# Patient Record
Sex: Female | Born: 1965 | Race: White | Hispanic: Yes | Marital: Married | Smoking: Never smoker
Health system: Southern US, Community
[De-identification: ages and names within clinical notes are randomized; demographics above are authoritative.]

## PROBLEM LIST (undated history)

## (undated) DIAGNOSIS — E669 Obesity, unspecified: Secondary | ICD-10-CM

## (undated) DIAGNOSIS — N3946 Mixed incontinence: Secondary | ICD-10-CM

## (undated) DIAGNOSIS — I10 Essential (primary) hypertension: Secondary | ICD-10-CM

## (undated) DIAGNOSIS — E7800 Pure hypercholesterolemia, unspecified: Secondary | ICD-10-CM

## (undated) HISTORY — DX: Pure hypercholesterolemia, unspecified: E78.00

## (undated) HISTORY — PX: BREAST LUMPECTOMY: SHX2

## (undated) HISTORY — PX: BREAST EXCISIONAL BIOPSY: SUR124

## (undated) HISTORY — DX: Mixed incontinence: N39.46

## (undated) HISTORY — DX: Obesity, unspecified: E66.9

---

## 1998-06-19 HISTORY — PX: TUBAL LIGATION: SHX77

## 2005-07-10 ENCOUNTER — Ambulatory Visit: Payer: Self-pay | Admitting: Family Medicine

## 2005-07-11 ENCOUNTER — Ambulatory Visit: Payer: Self-pay | Admitting: *Deleted

## 2005-08-21 ENCOUNTER — Ambulatory Visit: Payer: Self-pay | Admitting: Family Medicine

## 2005-08-24 ENCOUNTER — Ambulatory Visit (HOSPITAL_COMMUNITY): Admission: RE | Admit: 2005-08-24 | Discharge: 2005-08-24 | Payer: Self-pay | Admitting: Internal Medicine

## 2005-09-04 ENCOUNTER — Ambulatory Visit: Payer: Self-pay | Admitting: Family Medicine

## 2005-09-14 ENCOUNTER — Ambulatory Visit: Payer: Self-pay | Admitting: Family Medicine

## 2006-05-04 ENCOUNTER — Ambulatory Visit: Payer: Self-pay | Admitting: Family Medicine

## 2006-05-30 ENCOUNTER — Ambulatory Visit: Payer: Self-pay | Admitting: Family Medicine

## 2006-07-11 ENCOUNTER — Ambulatory Visit: Payer: Self-pay | Admitting: Family Medicine

## 2006-10-03 ENCOUNTER — Ambulatory Visit: Payer: Self-pay | Admitting: Family Medicine

## 2006-12-24 ENCOUNTER — Ambulatory Visit: Payer: Self-pay | Admitting: Internal Medicine

## 2007-01-23 ENCOUNTER — Ambulatory Visit: Payer: Self-pay | Admitting: Internal Medicine

## 2007-01-25 ENCOUNTER — Ambulatory Visit (HOSPITAL_COMMUNITY): Admission: RE | Admit: 2007-01-25 | Discharge: 2007-01-25 | Payer: Self-pay | Admitting: Family Medicine

## 2007-01-29 ENCOUNTER — Ambulatory Visit: Payer: Self-pay | Admitting: Internal Medicine

## 2007-03-06 ENCOUNTER — Encounter (INDEPENDENT_AMBULATORY_CARE_PROVIDER_SITE_OTHER): Payer: Self-pay | Admitting: *Deleted

## 2007-04-15 ENCOUNTER — Ambulatory Visit: Payer: Self-pay | Admitting: Internal Medicine

## 2007-06-21 ENCOUNTER — Ambulatory Visit: Payer: Self-pay | Admitting: Internal Medicine

## 2007-08-30 ENCOUNTER — Encounter (INDEPENDENT_AMBULATORY_CARE_PROVIDER_SITE_OTHER): Payer: Self-pay | Admitting: Family Medicine

## 2007-08-30 ENCOUNTER — Ambulatory Visit: Payer: Self-pay | Admitting: Internal Medicine

## 2007-08-30 LAB — CONVERTED CEMR LAB
ALT: 15 units/L (ref 0–35)
Basophils Absolute: 0 10*3/uL (ref 0.0–0.1)
Basophils Relative: 0 % (ref 0–1)
CO2: 24 meq/L (ref 19–32)
Chloride: 107 meq/L (ref 96–112)
Cholesterol: 149 mg/dL (ref 0–200)
Glucose, Bld: 100 mg/dL — ABNORMAL HIGH (ref 70–99)
HCT: 40.5 % (ref 36.0–46.0)
HDL: 52 mg/dL (ref >39)
LDL Cholesterol: 81 mg/dL (ref 0–99)
Lymphocytes Relative: 35 % (ref 12–46)
MCHC: 32.1 g/dL (ref 30.0–36.0)
Monocytes Absolute: 0.4 10*3/uL (ref 0.1–1.0)
Neutrophils Relative %: 56 % (ref 43–77)
Platelets: 182 10*3/uL (ref 150–400)
Potassium: 4.6 meq/L (ref 3.5–5.3)
RDW: 13.1 % (ref 11.5–15.5)
Sodium: 140 meq/L (ref 135–145)
TSH: 0.939 microintl units/mL (ref 0.350–5.50)
Total CHOL/HDL Ratio: 2.9
VLDL: 16 mg/dL (ref 0–40)

## 2007-10-07 ENCOUNTER — Encounter: Payer: Self-pay | Admitting: Family Medicine

## 2007-10-07 ENCOUNTER — Ambulatory Visit: Payer: Self-pay | Admitting: Internal Medicine

## 2007-10-07 LAB — CONVERTED CEMR LAB
Chlamydia, DNA Probe: NEGATIVE
GC Probe Amp, Genital: NEGATIVE

## 2007-12-19 ENCOUNTER — Ambulatory Visit: Payer: Self-pay | Admitting: Internal Medicine

## 2008-05-22 ENCOUNTER — Ambulatory Visit: Payer: Self-pay | Admitting: Internal Medicine

## 2008-05-22 ENCOUNTER — Encounter (INDEPENDENT_AMBULATORY_CARE_PROVIDER_SITE_OTHER): Payer: Self-pay | Admitting: Adult Health

## 2008-05-22 LAB — CONVERTED CEMR LAB
Alkaline Phosphatase: 83 units/L (ref 39–117)
BUN: 8 mg/dL (ref 6–23)
Basophils Relative: 0 % (ref 0–1)
Calcium: 9.2 mg/dL (ref 8.4–10.5)
Chloride: 105 meq/L (ref 96–112)
Cholesterol: 187 mg/dL (ref 0–200)
Eosinophils Absolute: 0 10*3/uL (ref 0.0–0.7)
Eosinophils Relative: 0 % (ref 0–5)
Glucose, Bld: 88 mg/dL (ref 70–99)
HDL: 50 mg/dL (ref >39)
LDL Cholesterol: 118 mg/dL — ABNORMAL HIGH (ref 0–99)
Monocytes Absolute: 0.3 10*3/uL (ref 0.1–1.0)
Neutrophils Relative %: 52 % (ref 43–77)
Platelets: 178 10*3/uL (ref 150–400)
Potassium: 4.1 meq/L (ref 3.5–5.3)
RBC: 4.54 M/uL (ref 3.87–5.11)
RDW: 13 % (ref 11.5–15.5)
TSH: 1.648 microintl units/mL (ref 0.350–4.50)
Total CHOL/HDL Ratio: 3.7
Triglycerides: 95 mg/dL (ref <150)
WBC: 5.2 10*3/uL (ref 4.0–10.5)

## 2008-05-27 ENCOUNTER — Ambulatory Visit (HOSPITAL_COMMUNITY): Admission: RE | Admit: 2008-05-27 | Discharge: 2008-05-27 | Payer: Self-pay | Admitting: Internal Medicine

## 2009-02-10 ENCOUNTER — Ambulatory Visit: Payer: Self-pay | Admitting: Internal Medicine

## 2009-02-10 ENCOUNTER — Encounter (INDEPENDENT_AMBULATORY_CARE_PROVIDER_SITE_OTHER): Payer: Self-pay | Admitting: Adult Health

## 2009-02-10 LAB — CONVERTED CEMR LAB
Basophils Relative: 0 % (ref 0–1)
Eosinophils Relative: 1 % (ref 0–5)
Helicobacter Pylori Antibody-IgG: 4.5 — ABNORMAL HIGH
Lymphs Abs: 2.1 10*3/uL (ref 0.7–4.0)
MCHC: 33.3 g/dL (ref 30.0–36.0)
MCV: 88 fL (ref 78.0–100.0)
Monocytes Relative: 7 % (ref 3–12)
Neutro Abs: 3.2 10*3/uL (ref 1.7–7.7)
Platelets: 168 10*3/uL (ref 150–400)
RBC: 4.65 M/uL (ref 3.87–5.11)
WBC: 5.8 10*3/uL (ref 4.0–10.5)

## 2009-02-24 ENCOUNTER — Ambulatory Visit (HOSPITAL_COMMUNITY): Admission: RE | Admit: 2009-02-24 | Discharge: 2009-02-24 | Payer: Self-pay | Admitting: Internal Medicine

## 2009-03-23 ENCOUNTER — Encounter (INDEPENDENT_AMBULATORY_CARE_PROVIDER_SITE_OTHER): Payer: Self-pay | Admitting: Adult Health

## 2009-03-23 ENCOUNTER — Ambulatory Visit: Payer: Self-pay | Admitting: Internal Medicine

## 2009-03-23 LAB — CONVERTED CEMR LAB: GC Probe Amp, Genital: NEGATIVE

## 2009-08-18 ENCOUNTER — Ambulatory Visit: Payer: Self-pay | Admitting: Internal Medicine

## 2009-09-10 ENCOUNTER — Ambulatory Visit: Payer: Self-pay | Admitting: Internal Medicine

## 2010-06-19 HISTORY — PX: EYE SURGERY: SHX253

## 2010-07-08 ENCOUNTER — Encounter (INDEPENDENT_AMBULATORY_CARE_PROVIDER_SITE_OTHER): Payer: Self-pay | Admitting: *Deleted

## 2010-07-08 ENCOUNTER — Other Ambulatory Visit: Payer: Self-pay | Admitting: Family Medicine

## 2010-07-08 LAB — CONVERTED CEMR LAB
ALT: 20 units/L (ref 0–35)
Albumin: 4.5 g/dL (ref 3.5–5.2)
Alkaline Phosphatase: 109 units/L (ref 39–117)
BUN: 10 mg/dL (ref 6–23)
Chlamydia, DNA Probe: NEGATIVE
Chloride: 104 meq/L (ref 96–112)
GC Probe Amp, Genital: NEGATIVE
HDL: 43 mg/dL (ref >39)
MCV: 91 fL (ref 78.0–100.0)
Platelets: 196 10*3/uL (ref 150–400)
RBC: 4.69 M/uL (ref 3.87–5.11)
TSH: 1.1 microintl units/mL (ref 0.350–4.500)
Total CHOL/HDL Ratio: 4.1
Total Protein: 7.5 g/dL (ref 6.0–8.3)
Triglycerides: 105 mg/dL (ref <150)
VLDL: 21 mg/dL (ref 0–40)
WBC: 5.6 10*3/uL (ref 4.0–10.5)

## 2011-06-20 DIAGNOSIS — E7800 Pure hypercholesterolemia, unspecified: Secondary | ICD-10-CM

## 2011-06-20 HISTORY — DX: Pure hypercholesterolemia, unspecified: E78.00

## 2014-02-16 ENCOUNTER — Ambulatory Visit: Payer: Self-pay | Attending: Internal Medicine

## 2014-04-09 ENCOUNTER — Other Ambulatory Visit: Payer: Self-pay | Admitting: Family Medicine

## 2014-04-09 ENCOUNTER — Encounter: Payer: Self-pay | Admitting: Family Medicine

## 2014-04-09 ENCOUNTER — Ambulatory Visit: Payer: Self-pay | Attending: Family Medicine | Admitting: Family Medicine

## 2014-04-09 VITALS — BP 143/88 | HR 77 | Temp 98.5°F | Resp 18 | Ht 59.61 in | Wt 168.0 lb

## 2014-04-09 DIAGNOSIS — IMO0001 Reserved for inherently not codable concepts without codable children: Secondary | ICD-10-CM

## 2014-04-09 DIAGNOSIS — R319 Hematuria, unspecified: Secondary | ICD-10-CM

## 2014-04-09 DIAGNOSIS — Z23 Encounter for immunization: Secondary | ICD-10-CM

## 2014-04-09 DIAGNOSIS — R030 Elevated blood-pressure reading, without diagnosis of hypertension: Secondary | ICD-10-CM | POA: Insufficient documentation

## 2014-04-09 DIAGNOSIS — J309 Allergic rhinitis, unspecified: Secondary | ICD-10-CM

## 2014-04-09 DIAGNOSIS — M545 Low back pain, unspecified: Secondary | ICD-10-CM

## 2014-04-09 NOTE — Progress Notes (Signed)
   Subjective:    Patient ID: Sheri Mann, female    DOB: 1966/02/27, 48 y.o.   MRN: [id number] CC: establish care. Congestion/hoarness x 2 months, pain on L lower side,   HPI 48 yo Hispanic female presents to establish care and discuss the following: History obtained via in house interpreter   1. Congestion: x 2 months. Associated with hoarseness. Could not speak for 15 days. No fever, no sick contacts. Symptoms improving. She is taking alka seltzer, lemon, vicks.   2. L side pain: more than a year ago. Comes and goes. Also left sided. Numbness radiates down L thigh. Also with L low back pain. No dysuria. No gross hematuria.   2. Elevated BP: first informed of high blood pressure 2 years ago. Admits to HA and decreased vision. Denies CP and SOB.    Soc hx: chronic non smoker  Review of Systems As per HPI  GAD-7: score of 9. 0-3,5,7. 2-1,4,6. 3-2.     Objective:   Physical Exam BP 143/88  Pulse 77  Temp(Src) 98.5 F (36.9 C) (Oral)  Resp 18  Ht 4' 11.61" (1.514 m)  Wt 168 lb (76.204 kg)  BMI 33.24 kg/m2  SpO2 100%  LMP 02/10/2014 General appearance: alert, cooperative and no distress Head: Normocephalic, without obvious abnormality, atraumatic Eyes: conjunctivae/corneas clear. PERRL, EOM's intact.  Ears: normal TM's and external ear canals both ears Nose: no discharge, turbinates pink, swollen Throat: lips, mucosa, and tongue normal; teeth and gums normal Neck: no adenopathy and thyroid not enlarged, symmetric, no tenderness/mass/nodules Abdomen: soft, non-tender; bowel sounds normal; no masses,  no organomegaly Back Exam: Back: Normal Curvature, no deformities or CVA tenderness  Paraspinal Tenderness: L lumbar   LE Strength 5/5  LE Sensation: in tact  LE Reflexes 2+ and symmetric  Straight leg raise: negative  FABER: negative      Assessment & Plan:

## 2014-04-09 NOTE — Progress Notes (Signed)
Establish Care Complaining of congestion, no voice at night x 2 month

## 2014-04-10 DIAGNOSIS — R030 Elevated blood-pressure reading, without diagnosis of hypertension: Secondary | ICD-10-CM

## 2014-04-10 DIAGNOSIS — M545 Low back pain, unspecified: Secondary | ICD-10-CM | POA: Insufficient documentation

## 2014-04-10 DIAGNOSIS — R319 Hematuria, unspecified: Secondary | ICD-10-CM | POA: Insufficient documentation

## 2014-04-10 DIAGNOSIS — IMO0001 Reserved for inherently not codable concepts without codable children: Secondary | ICD-10-CM | POA: Insufficient documentation

## 2014-04-10 DIAGNOSIS — J309 Allergic rhinitis, unspecified: Secondary | ICD-10-CM | POA: Insufficient documentation

## 2014-04-10 DIAGNOSIS — M79605 Pain in left leg: Secondary | ICD-10-CM

## 2014-04-10 LAB — COMPREHENSIVE METABOLIC PANEL
ALBUMIN: 4.2 g/dL (ref 3.5–5.2)
ALT: 18 U/L (ref 0–35)
AST: 19 U/L (ref 0–37)
Alkaline Phosphatase: 126 U/L — ABNORMAL HIGH (ref 39–117)
BUN: 14 mg/dL (ref 6–23)
CALCIUM: 9.9 mg/dL (ref 8.4–10.5)
CO2: 28 meq/L (ref 19–32)
Chloride: 105 mEq/L (ref 96–112)
Creat: 0.82 mg/dL (ref 0.50–1.10)
GLUCOSE: 100 mg/dL — AB (ref 70–99)
POTASSIUM: 3.8 meq/L (ref 3.5–5.3)
Sodium: 139 mEq/L (ref 135–145)
Total Bilirubin: 0.4 mg/dL (ref 0.2–1.2)
Total Protein: 7.2 g/dL (ref 6.0–8.3)

## 2014-04-10 LAB — LIPID PANEL
CHOLESTEROL: 199 mg/dL (ref 0–200)
HDL: 51 mg/dL (ref >39)
LDL CALC: 120 mg/dL — AB (ref 0–99)
TRIGLYCERIDES: 141 mg/dL (ref <150)
Total CHOL/HDL Ratio: 3.9 Ratio
VLDL: 28 mg/dL (ref 0–40)

## 2014-04-10 LAB — CBC
HCT: 38.8 % (ref 36.0–46.0)
Hemoglobin: 12.8 g/dL (ref 12.0–15.0)
MCH: 28.3 pg (ref 26.0–34.0)
MCHC: 33 g/dL (ref 30.0–36.0)
MCV: 85.8 fL (ref 78.0–100.0)
PLATELETS: 188 10*3/uL (ref 150–400)
RBC: 4.52 MIL/uL (ref 3.87–5.11)
RDW: 13.8 % (ref 11.5–15.5)
WBC: 5.5 10*3/uL (ref 4.0–10.5)

## 2014-04-10 LAB — POCT URINALYSIS DIPSTICK
BILIRUBIN UA: NEGATIVE
GLUCOSE UA: NEGATIVE
Leukocytes, UA: NEGATIVE
NITRITE UA: NEGATIVE
PH UA: 5
Protein, UA: NEGATIVE
SPEC GRAV UA: 1.02
UROBILINOGEN UA: 0.2

## 2014-04-10 MED ORDER — TRIAMCINOLONE ACETONIDE 55 MCG/ACT NA AERO: 2.0000 | INHALATION_SPRAY | Freq: Every day | NASAL | Status: DC

## 2014-04-10 MED ORDER — LORATADINE 10 MG PO TABS: 10.0000 mg | ORAL_TABLET | Freq: Every day | ORAL | Status: DC

## 2014-04-10 NOTE — Patient Instructions (Signed)
Thank you for coming in today. It was a pleasure meeting you. I look forward to being your primary doctor.  Dr. Armen PickupFunches  Ejercicios para la espalda (Back Exercises) Estos ejercicios ayudan a tratar y a prevenir lesiones en la espalda. El objetivo es aumentar la fuerza en los msculos del vientre (abdomen) y de la espalda. Estos ejercicios tambin lo ayudarn a mejorar la flexibilidad. Comience a realizar estos ejercicios cuando el mdico se lo indique. CUIDADOS EN EL HOGAR Los ejercicios para la espalda incluyen: Inclinacin de la pelvis.  Recustese sobre la espalda con las rodillas flexionadas. Incline la pelvis hasta que la parte inferior de la espalda se apoye en el piso. Mantenga esta posicin durante 5 a 10 segundos. Repita este ejercicio 5 a 10 veces. Rodilla al pecho.  Empuje con la rodilla contra el pecho y Singacmantenga esta posicin durante 20 a 30 segundos. Repita con la otra pierna. Esto puede realizarlo con la otra pierna extendida o flexionada, del modo en que se sienta ms cmodo. Luego presione ambas rodillas contra el pecho. Abdominales.  Doble las rodillas a 90 grados. Comience doblando la pelvis y haga un abdominal parcial y en forma lenta. Slo eleve la parte superior a 30  45 grados del suelo. Emplee al BJ's Wholesalemenos entre 2 y 3 segundos para cada abdominal. No haga los abdominales con las rodillas extendidas. Si hacer abdominales parciales le resulta difcil, simplemente haga el ejercicio pero slo endureciendo los msculos del vientre(abdomen) y manteniendo segn la indicacin. Elevar la cadera.  Recustese sobre la espalda con las rodillas flexionadas a 90 grados. Presione con los pies y los hombros a medida que eleva las caderas a 5 cm del suelo. Mantenga durante 10 segundos y repita 5 a 10 veces. Arquear la espalda.  Acustese Eli Lilly and Companysobre el estmago. Levntese apoyando los codos doblados. Presione lentamente con las manos, formando un arco con la zona inferior de la espalda. Repita  entre 3 y 5 veces. Elevar los hombros.  Acustese boca abajo con los brazos a los lados del cuerpo. Presione las caderas y Dance movement psychotherapistel torso contra el suelo mientras eleva lentamente la cabeza y los hombros del suelo. No exagere al ARAMARK Corporationhacer los ejercicios. Tenga cuidado al principio. Los ejercicios pueden causar algunas molestias leves en la espalda. Si el dolor dura ms de 15 minutos, detenga los ejercicios hasta que consulte al mdico. Los problemas en la espalda mejoran de Airmontmanera lenta con esta terapia.  Document Released: 09/20/2010 Document Revised: 08/28/2011 [facility] Patient Information 2015 [place]ExitCare, [place]LLC. This information is not intended to replace advice given to you by your health care provider. Make sure you discuss any questions you have with your health care provider.

## 2014-04-10 NOTE — Assessment & Plan Note (Signed)
A: congestion x 2 months consistent with allergic rhinitis P: claritin 10 daily  nasacort

## 2014-04-10 NOTE — Assessment & Plan Note (Signed)
A: history of elevated BP and BP elevated today P: F/u in 2 weeks for repeat BP check CBC CMP Lipids

## 2014-04-10 NOTE — Addendum Note (Signed)
Addended by: Dyann KiefGIRALDEZ, Rylynne Schicker M on: 04/10/2014 12:08 PM   Modules accepted: Orders

## 2014-04-10 NOTE — Assessment & Plan Note (Signed)
A: moderate hematuria on UA P:  Repeat UA with micro and culture at f/u

## 2014-04-10 NOTE — Assessment & Plan Note (Signed)
A: left low back pain w/o sciatica P: Exercise

## 2014-04-14 ENCOUNTER — Telehealth: Payer: Self-pay | Admitting: *Deleted

## 2014-04-14 NOTE — Telephone Encounter (Signed)
Pt aware of lab results, advised to maintain a diet low in carbohydrates and fat

## 2014-04-14 NOTE — Telephone Encounter (Signed)
Message copied by Dyann KiefGIRALDEZ, Sherrine Salberg M on Tue Apr 14, 2014  4:29 PM ------      Message from: Dessa PhiFUNCHES, JOSALYN      Created: Mon Apr 13, 2014  3:40 PM       Normal CBC      CMP normal       Slightly elevated LDL (cholesterol), no need to medication therapy.       Recommend exercise and low carb/low fat diet ------

## 2014-04-28 ENCOUNTER — Telehealth: Payer: Self-pay | Admitting: Family Medicine

## 2014-04-28 NOTE — Telephone Encounter (Signed)
Pt. Called stating that she has been having a lot of pain on her left side going down to her left leg. Pt. States that her leg feels numb. Please f/u with pt. With an interpreter.

## 2014-05-01 NOTE — Telephone Encounter (Signed)
Pt stated has pain on legs for a couple of month. Has appointment with PCP on Tuesday. Advised if any worsen go to Urgent care

## 2014-05-01 NOTE — Telephone Encounter (Signed)
Pt. Called stating that she has been having a lot of pain on her left side going down to her left leg. Pt. States that her leg feels numb. Please f/u with pt. With an interpreter. °

## 2014-05-05 ENCOUNTER — Encounter: Payer: Self-pay | Admitting: Family Medicine

## 2014-05-05 ENCOUNTER — Ambulatory Visit: Payer: Self-pay | Attending: Family Medicine | Admitting: Family Medicine

## 2014-05-05 VITALS — BP 131/90 | HR 73 | Temp 98.3°F | Resp 16 | Ht 59.0 in | Wt 171.0 lb

## 2014-05-05 DIAGNOSIS — M545 Low back pain, unspecified: Secondary | ICD-10-CM

## 2014-05-05 DIAGNOSIS — Z299 Encounter for prophylactic measures, unspecified: Secondary | ICD-10-CM

## 2014-05-05 DIAGNOSIS — Z418 Encounter for other procedures for purposes other than remedying health state: Secondary | ICD-10-CM

## 2014-05-05 DIAGNOSIS — M5442 Lumbago with sciatica, left side: Secondary | ICD-10-CM | POA: Insufficient documentation

## 2014-05-05 DIAGNOSIS — Z23 Encounter for immunization: Secondary | ICD-10-CM

## 2014-05-05 MED ORDER — CYCLOBENZAPRINE HCL 10 MG PO TABS: 10.0000 mg | ORAL_TABLET | Freq: Three times a day (TID) | ORAL | Status: DC | PRN

## 2014-05-05 MED ORDER — NAPROXEN 500 MG PO TABS: 500.0000 mg | ORAL_TABLET | Freq: Two times a day (BID) | ORAL | Status: DC

## 2014-05-05 MED ORDER — GABAPENTIN 300 MG PO CAPS: 300.0000 mg | ORAL_CAPSULE | Freq: Every day | ORAL | Status: DC

## 2014-05-05 NOTE — Progress Notes (Signed)
Complaining of LT hip pain going down to lt leg Stated pain was so strong was hard to walk

## 2014-05-05 NOTE — Assessment & Plan Note (Signed)
A: low back pain with intermittent sciatica P: Lumbar x-ray Gabapentin at night Naproxen, flexeril  F/u in 6 weeks

## 2014-05-05 NOTE — Progress Notes (Signed)
   Subjective:    Patient ID: Sheri Mann, female    DOB: 1965-08-10, 48 y.o.   MRN: [id number] CC: L low back pain HPI [age] yo hispanic female, history via interpreter  1. L sided back pain: comes and goes. Bad now. Radiates down L leg. No falls. No leg weakness. No fecal or urinary incontinence. No groin numbness. Patient does not work. No treatments at home. Has tried home low back exercises which exacerbate pain at time.   Soc hx: non smoker  Review of Systems As per HPI     Objective:   Physical Exam BP 131/90 mmHg  Pulse 73  Temp(Src) 98.3 F (36.8 C) (Oral)  Resp 16  Ht 4\' 11"  (1.499 m)  Wt 171 lb (77.565 kg)  BMI 34.52 kg/m2  SpO2 100%  LMP 02/10/2014 General appearance: alert, cooperative and no distress  Back Exam: Back: Normal Curvature, no deformities or CVA tenderness  Paraspinal Tenderness: negative   LE Strength 5/5  LE Sensation: in tact  LE Reflexes 2+ and symmetric  Straight leg raise: positive on L side         Assessment & Plan:

## 2014-05-05 NOTE — Patient Instructions (Addendum)
Ms. Penne LashGracia,  Thank you for coming today.  F/u in 8 weeks  Dr. Armen PickupFunches

## 2014-05-06 ENCOUNTER — Ambulatory Visit (HOSPITAL_COMMUNITY)
Admission: RE | Admit: 2014-05-06 | Discharge: 2014-05-06 | Disposition: A | Discharge status: Home / Self Care | Payer: Self-pay | Source: Ambulatory Visit | Attending: Family Medicine | Admitting: Family Medicine

## 2014-05-06 DIAGNOSIS — M545 Low back pain, unspecified: Secondary | ICD-10-CM

## 2014-05-06 DIAGNOSIS — M79605 Pain in left leg: Secondary | ICD-10-CM | POA: Insufficient documentation

## 2014-05-07 ENCOUNTER — Telehealth: Payer: Self-pay | Admitting: *Deleted

## 2014-05-07 NOTE — Telephone Encounter (Signed)
Pt aware of lab results, advised to continue with current plan

## 2014-05-07 NOTE — Telephone Encounter (Signed)
-----   Message from Lora PaulaJosalyn C Funches, MD sent at 05/07/2014 11:52 AM EST ----- Normal low back x-ray Continue current plan

## 2014-05-19 ENCOUNTER — Ambulatory Visit: Payer: Self-pay | Attending: Family Medicine

## 2014-08-20 ENCOUNTER — Encounter: Payer: Self-pay | Admitting: Family Medicine

## 2014-08-20 ENCOUNTER — Ambulatory Visit: Payer: Self-pay | Attending: Family Medicine | Admitting: Family Medicine

## 2014-08-20 VITALS — BP 126/82 | HR 76 | Temp 98.2°F | Resp 16 | Ht 59.0 in | Wt 174.0 lb

## 2014-08-20 DIAGNOSIS — M545 Low back pain, unspecified: Secondary | ICD-10-CM

## 2014-08-20 DIAGNOSIS — M25562 Pain in left knee: Secondary | ICD-10-CM | POA: Insufficient documentation

## 2014-08-20 DIAGNOSIS — J309 Allergic rhinitis, unspecified: Secondary | ICD-10-CM

## 2014-08-20 DIAGNOSIS — R0781 Pleurodynia: Secondary | ICD-10-CM

## 2014-08-20 MED ORDER — LORATADINE 10 MG PO TABS: 10.0000 mg | ORAL_TABLET | Freq: Every day | ORAL | Status: DC

## 2014-08-20 MED ORDER — TRIAMCINOLONE ACETONIDE 55 MCG/ACT NA AERO: 2.0000 | INHALATION_SPRAY | Freq: Every day | NASAL | Status: DC

## 2014-08-20 MED ORDER — NAPROXEN 500 MG PO TABS: 500.0000 mg | ORAL_TABLET | Freq: Two times a day (BID) | ORAL | Status: DC

## 2014-08-20 MED ORDER — METHYLPREDNISOLONE ACETATE 40 MG/ML IJ SUSP
40.0000 mg | Freq: Once | INTRAMUSCULAR | Status: AC
  Administered 2014-08-20: 40 mg via INTRA_ARTICULAR

## 2014-08-20 NOTE — Progress Notes (Signed)
   Subjective:    Patient ID: Sheri Mann, female    DOB: Mar 24, 1966, 49 y.o.   MRN: [id number] CC: L knee pain  X 1 month, URI symptoms x 2 weeks  HPI 49 yo M:  1. L knee pain: x one month. Medial knee pain with swelling. No erythema. No injury. Pain is worse when walking up stairs. Has similar pain and swelling in R knee many years ago.   2. Congestion: in nose with SOB x 2 weeks. Has hx of allergic rhinitis. No taking nasacort or claritin. No known sick contacts. Fever 1 week ago. Has back in back when she takes a deep breath x 2 weeks. No coughing, dizziness, lightheadedness or chest pain.   Soc hx: non smoker  Review of Systems As per HPI     Objective:   Physical Exam BP 126/82 mmHg  Pulse 76  Temp(Src) 98.2 F (36.8 C)  Resp 16  Ht 4\' 11"  (1.499 m)  Wt 174 lb (78.926 kg)  BMI 35.13 kg/m2  SpO2 99% General appearance: alert, cooperative and no distress Head: Normocephalic, without obvious abnormality, atraumatic Eyes: conjunctivae/corneas clear. PERRL, EOM's intact. Fundi benign. Ears: normal TM's and external ear canals both ears Nose: no discharge, turbinates pink, swollen Throat: lips, mucosa, and tongue normal; teeth and gums normal Neck: no adenopathy, supple, symmetrical, trachea midline and thyroid not enlarged, symmetric, no tenderness/mass/nodules Lungs: clear to auscultation bilaterally Heart: regular rate and rhythm, S1, S2 normal, no murmur, click, rub or gallop  Back: no rash, non tender  Extremities: L knee with mild effusion, no erythema, full ROM, TTP medial knee joint   After obtaining informed consent and cleaning the skin using iodine and alcohol a  steroid injection was performed at L knee using 1% plain Lidocaine and 40 mg of Depo Medrol. This was well tolerated      Assessment & Plan:

## 2014-08-20 NOTE — Patient Instructions (Signed)
Mrs. Sheri Mann,  Thank you for coming in today.  1. L knee pain with swelling: Osteoarthritis suspected Possible soft tissue injury Plan: Steroid injection today Knee x-ray Naproxen as needed for pain Referral to sports medicine for knee ultrasound   2. Nasal congestion due to allergic rhinitis: Restart nasacort and claritin   3. Back pain deep breathing: Possible from recent viral infection Ruling out blood clot with D-dimer  F/u in 6 weeks   Dr. Armen PickupFunches

## 2014-08-20 NOTE — Progress Notes (Signed)
Patient here due to her left knee pain on ambulating for 1 month. Patient states knee was swollen in the past but not now. Patient did have episode of feeling really really hot and not sure what it is from. Patient also c/o of nose congestion and difficulty breathing times 2 weeks. Patient has used Tylenol with little relief.

## 2014-08-21 ENCOUNTER — Telehealth: Payer: Self-pay | Admitting: Emergency Medicine

## 2014-08-21 ENCOUNTER — Ambulatory Visit: Payer: Self-pay | Attending: Family Medicine

## 2014-08-21 ENCOUNTER — Other Ambulatory Visit: Payer: Self-pay | Admitting: Emergency Medicine

## 2014-08-21 ENCOUNTER — Telehealth: Payer: Self-pay | Admitting: Family Medicine

## 2014-08-21 DIAGNOSIS — Z139 Encounter for screening, unspecified: Secondary | ICD-10-CM

## 2014-08-21 DIAGNOSIS — R0781 Pleurodynia: Secondary | ICD-10-CM

## 2014-08-21 LAB — BASIC METABOLIC PANEL
BUN: 14 mg/dL (ref 6–23)
CHLORIDE: 109 meq/L (ref 96–112)
CO2: 20 mEq/L (ref 19–32)
Calcium: 9.8 mg/dL (ref 8.4–10.5)
Creat: 0.71 mg/dL (ref 0.50–1.10)
Glucose, Bld: 97 mg/dL (ref 70–99)
POTASSIUM: 5 meq/L (ref 3.5–5.3)
SODIUM: 142 meq/L (ref 135–145)

## 2014-08-21 LAB — POCT URINE PREGNANCY: PREG TEST UR: NEGATIVE

## 2014-08-21 LAB — D-DIMER, QUANTITATIVE: D-Dimer, Quant: 0.57 ug/mL-FEU — ABNORMAL HIGH (ref 0.00–0.48)

## 2014-08-21 MED ORDER — RIVAROXABAN 15 MG PO TABS: 15.0000 mg | ORAL_TABLET | Freq: Two times a day (BID) | ORAL | Status: DC

## 2014-08-21 NOTE — Telephone Encounter (Signed)
Left message for pt to return call from [facility] per [facility] interpret[facility] line [id number] Will try calling pt again

## 2014-08-21 NOTE — Assessment & Plan Note (Signed)
Back pain deep breathing: Possible from recent viral infection Ruling out blood clot with D-dimer  Elevated D-dimer xarelto and CT angiogram ordered

## 2014-08-21 NOTE — Telephone Encounter (Signed)
Sheri Mann,   Please call patient with the following instructions:  1. D-dimer is elevated. This is a marker for blood clots. Since she also had SOB and back pain with deep breathing we will need to get a f/u study to evaluate your lungs for clot.  CTA angiogram-ordered. Needs to be scheduled  Please return to the clinic for the following: 1. Blood draw for BMP-need a recent Cr for test. 2. Urine for u preg-need to be sure she is not pregnant prior to test. 3. xarelto 10 mg BID, blood thinner to take while awaiting test. Do not take with naproxen.

## 2014-08-21 NOTE — Assessment & Plan Note (Addendum)
Nasal congestion due to allergic rhinitis: Restart nasacort and claritin

## 2014-08-21 NOTE — Telephone Encounter (Signed)
Pt given lab results with medication/lab work instructions per Spanish interpretor Pt will be in today for blood work/medication pick up

## 2014-08-21 NOTE — Telephone Encounter (Signed)
Pt given scheduled CT scan appointment 08/24/14, she needs to arrive @ 745am with 4 hrs NPO prior to test In house Spanish interpretor used Pt also given number to Radiology if need to reschedule Pt verbalized understanding

## 2014-08-24 ENCOUNTER — Other Ambulatory Visit: Payer: Self-pay

## 2014-08-24 ENCOUNTER — Encounter (HOSPITAL_COMMUNITY): Payer: Self-pay

## 2014-08-24 ENCOUNTER — Ambulatory Visit (HOSPITAL_COMMUNITY)
Admission: RE | Admit: 2014-08-24 | Discharge: 2014-08-24 | Disposition: A | Discharge status: Home / Self Care | Payer: Self-pay | Source: Ambulatory Visit | Attending: Family Medicine | Admitting: Family Medicine

## 2014-08-24 ENCOUNTER — Other Ambulatory Visit: Payer: Self-pay | Admitting: Family Medicine

## 2014-08-24 DIAGNOSIS — R7989 Other specified abnormal findings of blood chemistry: Secondary | ICD-10-CM | POA: Insufficient documentation

## 2014-08-24 DIAGNOSIS — R0602 Shortness of breath: Secondary | ICD-10-CM | POA: Insufficient documentation

## 2014-08-24 DIAGNOSIS — R0781 Pleurodynia: Secondary | ICD-10-CM

## 2014-08-24 DIAGNOSIS — R918 Other nonspecific abnormal finding of lung field: Secondary | ICD-10-CM | POA: Insufficient documentation

## 2014-08-24 DIAGNOSIS — M549 Dorsalgia, unspecified: Secondary | ICD-10-CM | POA: Insufficient documentation

## 2014-08-24 MED ORDER — IOHEXOL 350 MG/ML SOLN
100.0000 mL | Freq: Once | INTRAVENOUS | Status: AC | PRN
  Administered 2014-08-24: 80 mL via INTRAVENOUS

## 2014-08-24 NOTE — Assessment & Plan Note (Signed)
CT negative for PE. Patient may stop xarelto.  LE venous dopplers ordered to rule out DVT

## 2014-08-26 ENCOUNTER — Telehealth: Payer: Self-pay | Admitting: *Deleted

## 2014-08-26 NOTE — Telephone Encounter (Signed)
-----   Message from Dessa PhiJosalyn Funches, MD sent at 08/24/2014  9:16 AM EST ----- CT negative for PE. Patient may stop xarelto.  LE venous dopplers ordered to rule out DVT

## 2014-08-26 NOTE — Telephone Encounter (Signed)
Left voice message to return call 

## 2014-08-26 NOTE — Telephone Encounter (Signed)
-----   Message from Josalyn Funches, MD sent at 08/24/2014  9:16 AM EST ----- CT negative for PE. Patient may stop xarelto.  LE venous dopplers ordered to rule out DVT 

## 2014-08-26 NOTE — Telephone Encounter (Signed)
-----   Message from Lora PaulaJosalyn C Funches, MD sent at 08/23/2014  7:54 PM EST ----- Normal BMP, normal Cr  Patient to have CTA to r/o PE

## 2014-09-01 ENCOUNTER — Telehealth: Payer: Self-pay | Admitting: *Deleted

## 2014-09-01 NOTE — Telephone Encounter (Signed)
Pt [facility]

## 2014-09-02 ENCOUNTER — Ambulatory Visit (HOSPITAL_COMMUNITY)
Admission: RE | Admit: 2014-09-02 | Discharge: 2014-09-02 | Disposition: A | Discharge status: Home / Self Care | Payer: Self-pay | Source: Ambulatory Visit | Attending: Family Medicine | Admitting: Family Medicine

## 2014-09-02 DIAGNOSIS — R791 Abnormal coagulation profile: Secondary | ICD-10-CM

## 2014-09-02 DIAGNOSIS — M25562 Pain in left knee: Secondary | ICD-10-CM | POA: Insufficient documentation

## 2014-09-02 DIAGNOSIS — R7989 Other specified abnormal findings of blood chemistry: Secondary | ICD-10-CM

## 2014-09-02 NOTE — Progress Notes (Signed)
*  PRELIMINARY RESULTS* Vascular Ultrasound Bilateral lower extremity venous duplex has been completed.  Preliminary findings: No evidence of DVT. Rouleaux flow is noted in the right gastroc vein, however it improved with compression maneuvers.     Farrel DemarkJill Eunice, RDMS, RVT  09/02/2014, 10:25 AM

## 2014-09-03 ENCOUNTER — Telehealth: Payer: Self-pay | Admitting: *Deleted

## 2014-09-03 NOTE — Telephone Encounter (Signed)
-----   Message from Dessa PhiJosalyn Funches, MD sent at 09/02/2014  1:54 PM EDT ----- Negative knee x-ray

## 2014-09-03 NOTE — Telephone Encounter (Signed)
Pt aware of results  (results given in Spanish) 

## 2014-09-14 ENCOUNTER — Ambulatory Visit: Payer: Self-pay | Attending: Family Medicine

## 2014-09-15 ENCOUNTER — Ambulatory Visit: Payer: Self-pay | Admitting: Sports Medicine

## 2014-10-20 ENCOUNTER — Encounter (HOSPITAL_COMMUNITY): Payer: Self-pay | Admitting: Emergency Medicine

## 2014-10-20 ENCOUNTER — Emergency Department (HOSPITAL_COMMUNITY)
Admission: EM | Admit: 2014-10-20 | Discharge: 2014-10-21 | Disposition: A | Discharge status: Home / Self Care | Payer: Self-pay | Attending: Emergency Medicine | Admitting: Emergency Medicine

## 2014-10-20 DIAGNOSIS — Z3202 Encounter for pregnancy test, result negative: Secondary | ICD-10-CM | POA: Insufficient documentation

## 2014-10-20 DIAGNOSIS — Z8639 Personal history of other endocrine, nutritional and metabolic disease: Secondary | ICD-10-CM | POA: Insufficient documentation

## 2014-10-20 DIAGNOSIS — Z791 Long term (current) use of non-steroidal anti-inflammatories (NSAID): Secondary | ICD-10-CM | POA: Insufficient documentation

## 2014-10-20 DIAGNOSIS — Z7951 Long term (current) use of inhaled steroids: Secondary | ICD-10-CM | POA: Insufficient documentation

## 2014-10-20 DIAGNOSIS — I10 Essential (primary) hypertension: Secondary | ICD-10-CM | POA: Insufficient documentation

## 2014-10-20 DIAGNOSIS — Z79899 Other long term (current) drug therapy: Secondary | ICD-10-CM | POA: Insufficient documentation

## 2014-10-20 DIAGNOSIS — IMO0001 Reserved for inherently not codable concepts without codable children: Secondary | ICD-10-CM

## 2014-10-20 DIAGNOSIS — R143 Flatulence: Secondary | ICD-10-CM | POA: Insufficient documentation

## 2014-10-20 DIAGNOSIS — K5909 Other constipation: Secondary | ICD-10-CM | POA: Insufficient documentation

## 2014-10-20 DIAGNOSIS — K219 Gastro-esophageal reflux disease without esophagitis: Secondary | ICD-10-CM | POA: Insufficient documentation

## 2014-10-20 HISTORY — DX: Essential (primary) hypertension: I10

## 2014-10-20 LAB — CBC WITH DIFFERENTIAL/PLATELET
BASOS ABS: 0 10*3/uL (ref 0.0–0.1)
Basophils Relative: 0 % (ref 0–1)
Eosinophils Absolute: 0.1 10*3/uL (ref 0.0–0.7)
Eosinophils Relative: 1 % (ref 0–5)
HCT: 38.4 % (ref 36.0–46.0)
Hemoglobin: 12.9 g/dL (ref 12.0–15.0)
LYMPHS ABS: 2.6 10*3/uL (ref 0.7–4.0)
LYMPHS PCT: 44 % (ref 12–46)
MCH: 28.9 pg (ref 26.0–34.0)
MCHC: 33.6 g/dL (ref 30.0–36.0)
MCV: 86.1 fL (ref 78.0–100.0)
Monocytes Absolute: 0.3 10*3/uL (ref 0.1–1.0)
Monocytes Relative: 5 % (ref 3–12)
NEUTROS ABS: 2.9 10*3/uL (ref 1.7–7.7)
NEUTROS PCT: 50 % (ref 43–77)
PLATELETS: 172 10*3/uL (ref 150–400)
RBC: 4.46 MIL/uL (ref 3.87–5.11)
RDW: 12.5 % (ref 11.5–15.5)
WBC: 5.9 10*3/uL (ref 4.0–10.5)

## 2014-10-20 LAB — LIPASE, BLOOD: Lipase: 24 U/L (ref 22–51)

## 2014-10-20 LAB — POC URINE PREG, ED: Preg Test, Ur: NEGATIVE

## 2014-10-20 LAB — COMPREHENSIVE METABOLIC PANEL
ALT: 24 U/L (ref 14–54)
ANION GAP: 10 (ref 5–15)
AST: 35 U/L (ref 15–41)
Albumin: 4 g/dL (ref 3.5–5.0)
Alkaline Phosphatase: 138 U/L — ABNORMAL HIGH (ref 38–126)
BUN: 13 mg/dL (ref 6–20)
CALCIUM: 9.6 mg/dL (ref 8.9–10.3)
CO2: 24 mmol/L (ref 22–32)
CREATININE: 0.73 mg/dL (ref 0.44–1.00)
Chloride: 104 mmol/L (ref 101–111)
Glucose, Bld: 105 mg/dL — ABNORMAL HIGH (ref 70–99)
Potassium: 4.9 mmol/L (ref 3.5–5.1)
Sodium: 138 mmol/L (ref 135–145)
Total Bilirubin: 1.1 mg/dL (ref 0.3–1.2)
Total Protein: 7.4 g/dL (ref 6.5–8.1)

## 2014-10-20 LAB — URINALYSIS, ROUTINE W REFLEX MICROSCOPIC
Bilirubin Urine: NEGATIVE
GLUCOSE, UA: NEGATIVE mg/dL
Ketones, ur: NEGATIVE mg/dL
Nitrite: NEGATIVE
Protein, ur: NEGATIVE mg/dL
SPECIFIC GRAVITY, URINE: 1.012 (ref 1.005–1.030)
UROBILINOGEN UA: 0.2 mg/dL (ref 0.0–1.0)
pH: 5.5 (ref 5.0–8.0)

## 2014-10-20 LAB — URINE MICROSCOPIC-ADD ON

## 2014-10-20 NOTE — ED Provider Notes (Signed)
CSN: [id number]     Arrival date & time 10/20/14  2203 History  This chart was scribed for Chia Rock, MD by Sheri Mann, ED Scribe. This patient was seen in room A07C/A07C and the patient's care was started at 12:19 AM.    Chief Complaint  Patient presents with  . Abdominal Pain   Patient is a 49 y.o. female presenting with abdominal pain. The history is provided by the patient. No language interpreter was used (Patient's husband translated with patient's consent).  Abdominal Pain Pain location:  Generalized Pain quality: cramping   Pain radiates to:  Does not radiate Pain severity:  Moderate Onset quality:  Gradual Duration:  4 days Timing:  Constant Progression:  Unchanged Chronicity:  New Context: not sick contacts   Relieved by:  Nothing Worsened by:  Nothing tried Ineffective treatments:  None tried Associated symptoms: nausea and vomiting   Associated symptoms: no diarrhea, no dysuria and no fever   Risk factors: no recent hospitalization     HPI Comments: Sheri Mann is a 49 y.o. female who presents to the Emergency Department complaining of 3-4 days of generalized abdominal pain. Patient reports vomiting today, as well as appetite decrease. No modifying factors noted. No treatments or medications tried PTA. Patient reports prior experience with similar symptoms approximately 1 year PTA; she was told at that time she might have ulcers but is not on medication for this issue. She denies fevers, diarrhea, constipation, dysuria, sick contacts with similar symptoms.   Past Medical History  Diagnosis Date  . Elevated BP 2013   . High cholesterol 2013   . Hypertension    Past Surgical History  Procedure Laterality Date  . Eye surgery  2012  . Tubal ligation  2000  . Breast lumpectomy     Family History  Problem Relation Age of Onset  . Cancer Neg Hx   . Heart disease Neg Hx   . Diabetes Neg Hx   . Hypertension Neg Hx    History  Substance Use Topics  .  Smoking status: Never Smoker   . Smokeless tobacco: Never Used  . Alcohol Use: No   OB History    No data available     Review of Systems  Constitutional: Negative for fever.  Gastrointestinal: Positive for nausea, vomiting and abdominal pain. Negative for diarrhea.  Genitourinary: Negative for dysuria.  All other systems reviewed and are negative.  Allergies  Review of patient's allergies indicates no known allergies.  Home Medications   Prior to Admission medications   Medication Sig Start Date End Date Taking? Authorizing Provider  cyclobenzaprine (FLEXERIL) 10 MG tablet Take 1 tablet (10 mg total) by mouth 3 (three) times daily as needed for muscle spasms. 05/05/14   Josalyn Funches, MD  gabapentin (NEURONTIN) 300 MG capsule Take 1 capsule (300 mg total) by mouth at bedtime. 05/05/14   Josalyn Funches, MD  loratadine (CLARITIN) 10 MG tablet Take 1 tablet (10 mg total) by mouth daily. 08/20/14   Dessa PhiJosalyn Funches, MD  naproxen (NAPROSYN) 500 MG tablet Take 1 tablet (500 mg total) by mouth 2 (two) times daily with a meal. 08/20/14   Josalyn Funches, MD  triamcinolone (NASACORT AQ) 55 MCG/ACT AERO nasal inhaler Place 2 sprays into the nose daily. 08/20/14   Josalyn Funches, MD   BP 151/95 mmHg  Pulse 71  Temp(Src) 97.9 F (36.6 C)  Resp 16  SpO2 99% Physical Exam  Constitutional: She is oriented to person, place, and time. She  appears well-developed and well-nourished. No distress.  HENT:  Head: Normocephalic and atraumatic.  Mouth/Throat: Oropharynx is clear and moist. No oropharyngeal exudate.  Moist mucous membranes, uvula is midline  Eyes: EOM are normal. Pupils are equal, round, and reactive to light.  Neck: Normal range of motion. Neck supple. No JVD present.  Cardiovascular: Normal rate, regular rhythm and normal heart sounds.  Exam reveals no gallop and no friction rub.   No murmur heard. Pulmonary/Chest: Effort normal and breath sounds normal. No respiratory distress. She  has no wheezes. She has no rales.  Abdominal: Soft. She exhibits no mass. Bowel sounds are increased. There is no tenderness. There is no rebound and no guarding.  Increased bowel sounds diffusely  Musculoskeletal: Normal range of motion. She exhibits no edema.  Moves all extremities normally.   Lymphadenopathy:    She has no cervical adenopathy.  Neurological: She is alert and oriented to person, place, and time. She displays normal reflexes.  Skin: Skin is warm and dry. No rash noted.  Psychiatric: She has a normal mood and affect. Her behavior is normal.  Nursing note and vitals reviewed.   ED Course  Procedures   DIAGNOSTIC STUDIES: Oxygen Saturation is 99% on RA, normal by my interpretation.    COORDINATION OF CARE: 12:23 AM Discussed treatment plan with pt at bedside and pt agreed to plan.  2:11 AM  On re-evaluation, patient reports pain has improved. Stomach flatter, gas decreased. Benign vitals, labs, and abdominal exam. Patient is safe for discharge with close follow-up.    Labs Review Labs Reviewed  COMPREHENSIVE METABOLIC PANEL - Abnormal; Notable for the following:    Glucose, Bld 105 (*)    Alkaline Phosphatase 138 (*)    All other components within normal limits  URINALYSIS, ROUTINE W REFLEX MICROSCOPIC - Abnormal; Notable for the following:    Hgb urine dipstick TRACE (*)    Leukocytes, UA TRACE (*)    All other components within normal limits  CBC WITH DIFFERENTIAL/PLATELET  LIPASE, BLOOD  URINE MICROSCOPIC-ADD ON  POC URINE PREG, ED    Imaging Review No results found.   EKG Interpretation None      MDM   Final diagnoses:  None   Symptoms consistent with GERD and gas pain.  Pain is resolved post medication will treat for same and provide gerd friendly diet.    I personally performed the services described in this documentation, which was scribed in my presence. The recorded information has been reviewed and is accurate.      Cy Blamer,  MD 10/21/14 [phone]

## 2014-10-20 NOTE — ED Notes (Signed)
Pt. reports mid abdominal pain with nausea and dysuria , denies emesis or diarrhea . No fever / mild chills.

## 2014-10-21 ENCOUNTER — Emergency Department (HOSPITAL_COMMUNITY): Payer: Self-pay

## 2014-10-21 ENCOUNTER — Encounter (HOSPITAL_COMMUNITY): Payer: Self-pay | Admitting: Emergency Medicine

## 2014-10-21 MED ORDER — KETOROLAC TROMETHAMINE 60 MG/2ML IM SOLN
60.0000 mg | Freq: Once | INTRAMUSCULAR | Status: AC
  Administered 2014-10-21: 60 mg via INTRAMUSCULAR
  Filled 2014-10-21: qty 2

## 2014-10-21 MED ORDER — GI COCKTAIL ~~LOC~~
30.0000 mL | Freq: Once | ORAL | Status: AC
  Administered 2014-10-21: 30 mL via ORAL
  Filled 2014-10-21: qty 30

## 2014-10-21 MED ORDER — OMEPRAZOLE 20 MG PO CPDR: 20.0000 mg | DELAYED_RELEASE_CAPSULE | Freq: Every day | ORAL | Status: DC

## 2014-10-21 MED ORDER — DICYCLOMINE HCL 20 MG PO TABS: 20.0000 mg | ORAL_TABLET | Freq: Two times a day (BID) | ORAL | Status: DC

## 2014-10-21 MED ORDER — DICYCLOMINE HCL 10 MG/ML IM SOLN
20.0000 mg | Freq: Once | INTRAMUSCULAR | Status: AC
  Administered 2014-10-21: 20 mg via INTRAMUSCULAR
  Filled 2014-10-21: qty 2

## 2014-10-21 MED ORDER — POLYETHYLENE GLYCOL 3350 17 GM/SCOOP PO POWD: 17.0000 g | Freq: Every day | ORAL | Status: DC

## 2014-10-21 NOTE — ED Notes (Signed)
Pt stable, ambulatory, husband at bedside, states understanding of discharge instructions 

## 2014-10-21 NOTE — Discharge Instructions (Signed)
Hinchazn o distensin abdominal (Bloating) La distensin abdominal es la sensacin de plenitud del abdomen. Puede ser que sienta que sus pantalones estn muy ajustados. A menudo la causa de la hinchazn es comer demasiado, retener lquidos o tener gases en el intestino. Tambin est ocasionado por tragar aire y comer alimentos que causan gases. El sndrome del colon irritable es una de una de las casuas ms comunes de la hinchazn. La constipacin tambin es una causa comn. A veces esto puede estar ocasionado por problemas ms serios. SNTOMAS Suele haber una sensacin de plenitud, y su abdomen salido para afuera. Puede haber molestias leves.  DIAGNSTICO En la mayora de los casos de distensin abdominal no suele realizarse ningn anlisis en particular. Si la enfermedad persiste y Advertising account executiveparece empeorar, el mdico podr realizarle una prueba adicional.  TRATAMIENTO  No existe un tratamiento directo para la hinchazn.  No haga que el intestino se llene de gas. Puede ayudar a que esto no suceda. Evite mascar chicle y comer caramelos. Con esto se tiende a Psychologist, sport and exercisetragar aire. El tragar aire tambin puede ser un hbito nervioso. Trate de evitarlo.  Evitar dietas altas en residuos tambin ayudar. Consuma alimentos con fibras solubles y sustituya los productos lcteos con soja y Harlon Dittyarroz. Esto ayuda para el sndrome de colon irritable.  Si la causa es la constipacin, una dieta alta en residuos con ms fibra ayudar.  Evite las bebidas carbonatadas.  Los preparados de venta libre tambin le ayudarn a reducir gases. El farmacutico lo ayudar. SOLICITE ATENCIN MDICA SI:  La distensin contina y Advertising account executiveparece empeorar.  Nota que aumenta de [place]peso.  Tiene prdida de peso pero la hinchazn empeora.  Tiene cambios en sus hbitos al ir de cuerpo, o siente nuseas o vmitos. SOLICITE ATENCIN MDICA DE INMEDIATO SI:  Presenta dificultades para respirar o hinchazn en las piernas.  Siente un aumento del dolor  abdominal o dolor en el pecho. Document Released: 09/01/2008 Document Revised: 08/28/2011 [facility] Patient Information 2015 [place] HillExitCare, MarylandLLC. This information is not intended to replace advice given to you by your health care provider. Make sure you discuss any questions you have with your health care provider.

## 2015-01-28 ENCOUNTER — Ambulatory Visit: Payer: Self-pay | Attending: Family Medicine | Admitting: Family Medicine

## 2015-01-28 ENCOUNTER — Encounter: Payer: Self-pay | Admitting: Family Medicine

## 2015-01-28 VITALS — BP 116/81 | HR 71 | Temp 98.8°F | Resp 16 | Ht 59.0 in | Wt 171.0 lb

## 2015-01-28 DIAGNOSIS — R519 Headache, unspecified: Secondary | ICD-10-CM | POA: Insufficient documentation

## 2015-01-28 DIAGNOSIS — Z114 Encounter for screening for human immunodeficiency virus [HIV]: Secondary | ICD-10-CM

## 2015-01-28 DIAGNOSIS — N951 Menopausal and female climacteric states: Secondary | ICD-10-CM

## 2015-01-28 DIAGNOSIS — R51 Headache: Secondary | ICD-10-CM

## 2015-01-28 DIAGNOSIS — G44209 Tension-type headache, unspecified, not intractable: Secondary | ICD-10-CM

## 2015-01-28 LAB — POCT GLYCOSYLATED HEMOGLOBIN (HGB A1C): Hemoglobin A1C: 5.9

## 2015-01-28 LAB — GLUCOSE, POCT (MANUAL RESULT ENTRY): POC Glucose: 104 mg/dl — AB (ref 70–99)

## 2015-01-28 MED ORDER — IBUPROFEN 600 MG PO TABS: 600.0000 mg | ORAL_TABLET | Freq: Three times a day (TID) | ORAL | Status: DC | PRN

## 2015-01-28 NOTE — Patient Instructions (Signed)
Mrs. Sheri Mann,  Thank you for coming in today  1. Headache: Exam is normal today except for evidence of possible neck arthritis  Ibuprofen for pain Please schedule follow up eye exam   F/u in me in 4-6 weeks for pap smear and headache follow up   Dr. Armen Pickup

## 2015-01-28 NOTE — Progress Notes (Signed)
   Subjective:    Patient ID: Sheri Mann, female    DOB: 1966/03/25, 49 y.o.   MRN: [id number] CC: headaches x 2 weeks  HPI 49 yo F presents for f/u appt Spanish interpreter present  1. Headaches: x 2 weeks. Fatigue, weakness. No cough or fever. Headache are all over the head but most pain on L side and L neck.  Pain is described as throbbing or pinching and gradually worsens. Gets numbness in L arm. Has nausea. She has not vomited. Waking up early in morning with headache. Feeling depressed for the past 2 weeks as well. Denies SI. Has had similar pain previously.   Social History  Substance Use Topics  . Smoking status: Never Smoker   . Smokeless tobacco: Never Used  . Alcohol Use: No   Review of Systems  Constitutional: Positive for appetite change and fatigue. Negative for fever and chills.  HENT: Positive for ear pain.   Eyes: Negative for visual disturbance.  Respiratory: Negative for shortness of breath.   Cardiovascular: Negative for chest pain.  Gastrointestinal: Positive for nausea and vomiting. Negative for abdominal pain and blood in stool.  Musculoskeletal: Negative for back pain and arthralgias.  Skin: Negative for rash.  Allergic/Immunologic: Negative for immunocompromised state.  Neurological: Positive for numbness and headaches.  Hematological: Negative for adenopathy. Does not bruise/bleed easily.  Psychiatric/Behavioral: Negative for suicidal ideas and dysphoric mood.  GAD-7: score of 3. 1-1,3,6.     Objective:   Physical Exam  Constitutional: She is oriented to person, place, and time. She appears well-developed and well-nourished. No distress.  HENT:  Head: Normocephalic and atraumatic. Hair is normal.  Right Ear: External ear normal.  Left Ear: External ear normal.  Nose: Mucosal edema present. Right sinus exhibits no maxillary sinus tenderness and no frontal sinus tenderness. Left sinus exhibits no maxillary sinus tenderness and no frontal sinus  tenderness.  Mouth/Throat: Oropharynx is clear and moist. No oropharyngeal exudate.  Neck:    Cardiovascular: Normal rate, regular rhythm, normal heart sounds and intact distal pulses.   Pulmonary/Chest: Effort normal and breath sounds normal.  Abdominal: Soft. Bowel sounds are normal. There is no tenderness.  Musculoskeletal: She exhibits no edema.  Neurological: She is alert and oriented to person, place, and time. She has normal strength. No cranial nerve deficit or sensory deficit. She displays a negative Romberg sign. GCS eye subscore is 4. GCS verbal subscore is 5. GCS motor subscore is 6.  Skin: Skin is warm and dry. No rash noted.  Psychiatric: She exhibits a depressed mood.  BP 116/81 mmHg  Pulse 71  Temp(Src) 98.8 F (37.1 C) (Oral)  Resp 16  Ht  (1.499 m)  Wt 171 lb (77.565 kg)  BMI 34.52 kg/m2  SpO2 97%   Lab Results  Component Value Date   HGBA1C 5.90 01/28/2015     Depression screen [facility] 2/9 01/28/2015 05/05/2014 04/09/2014  Decreased Interest 3 0 1  Down, Depressed, Hopeless 3 0 2  PHQ - 2 Score 6 0 3  Altered sleeping 1 - 0  Tired, decreased energy 1 - 2  Change in appetite 1 - 3  Feeling bad or failure about yourself  0 - 2  Trouble concentrating 1 - 0  Moving slowly or fidgety/restless 1 - 0  Suicidal thoughts 0 - 0  PHQ-9 Score 11 - 10        Assessment & Plan:

## 2015-01-28 NOTE — Assessment & Plan Note (Signed)
Screening HIV ordered  

## 2015-01-28 NOTE — Progress Notes (Signed)
Complaining of HA no energy x 1 week Stated  HA started last week with no strength on arms

## 2015-01-28 NOTE — Assessment & Plan Note (Signed)
Headache, tension type  Exam is normal today except for evidence of possible neck arthritis  P:  Ibuprofen for pain Please schedule follow up eye exam

## 2015-01-29 LAB — HIV ANTIBODY (ROUTINE TESTING W REFLEX): HIV: NONREACTIVE

## 2015-02-17 ENCOUNTER — Telehealth: Payer: Self-pay | Admitting: *Deleted

## 2015-02-17 NOTE — Telephone Encounter (Signed)
-----   Message from Josalyn Funches, MD sent at 01/29/2015  9:10 AM EDT ----- Screening HIV negative 

## 2015-02-17 NOTE — Telephone Encounter (Signed)
Date of birth verified by pt  Lab results given  Information given in spanish

## 2015-03-03 ENCOUNTER — Encounter: Payer: Self-pay | Admitting: Family Medicine

## 2015-03-03 ENCOUNTER — Ambulatory Visit: Payer: Self-pay | Attending: Family Medicine | Admitting: Family Medicine

## 2015-03-03 VITALS — BP 116/79 | HR 65 | Temp 98.1°F | Resp 16 | Ht 59.0 in | Wt 172.0 lb

## 2015-03-03 DIAGNOSIS — Z124 Encounter for screening for malignant neoplasm of cervix: Secondary | ICD-10-CM

## 2015-03-03 NOTE — Progress Notes (Signed)
GYN exam  No vaginal odor no discharge  No Hx tobacco

## 2015-03-03 NOTE — Progress Notes (Signed)
   Subjective:    Patient ID: Sheri Mann, female    DOB: 07-06-1965, 49 y.o.   MRN: [id number]  HPI    Review of Systems     Objective:   Physical Exam        Assessment & Plan:

## 2015-03-03 NOTE — Patient Instructions (Addendum)
[name],  Thank you for coming in today Pap done today You will be called with results   Please return for flu shot during one of the flu clinics  F/u with me in 2 months for headache, sooner if needed  Dr. Armen Pickup

## 2015-03-03 NOTE — Progress Notes (Signed)
Patient ID: Sheri Mann, female   DOB: 05-27-1966, 49 y.o.   MRN: [id number]   Subjective:  Patient ID: Sheri Mann, female    DOB: 1965/12/28  Age: 49 y.o. MRN: [id number]  CC: Gynecologic Exam   HPI Sheri Mann presents for pap. She has no complaints. No hx of abnormal paps.   Outpatient Prescriptions Prior to Visit  Medication Sig Dispense Refill  . ibuprofen (ADVIL,MOTRIN) 600 MG tablet Take 1 tablet (600 mg total) by mouth every 8 (eight) hours as needed. 30 tablet 0   No facility-administered medications prior to visit.   Social History  Substance Use Topics  . Smoking status: Never Smoker   . Smokeless tobacco: Never Used  . Alcohol Use: No   ROS Review of Systems  Constitutional: Negative for fever and chills.  Eyes: Negative for visual disturbance.  Respiratory: Negative for shortness of breath.   Cardiovascular: Negative for chest pain.  Gastrointestinal: Negative for abdominal pain and blood in stool.  Genitourinary: Negative for vaginal bleeding, vaginal discharge, vaginal pain and menstrual problem.  Musculoskeletal: Negative for back pain and arthralgias.  Skin: Negative for rash.  Allergic/Immunologic: Negative for immunocompromised state.  Hematological: Negative for adenopathy. Does not bruise/bleed easily.  Psychiatric/Behavioral: Negative for suicidal ideas and dysphoric mood.    Objective:  BP 116/79 mmHg  Pulse 65  Temp(Src) 98.1 F (36.7 C) (Oral)  Resp 16  Ht  (1.499 m)  Wt 172 lb (78.019 kg)  BMI 34.72 kg/m2  SpO2 99%  LMP 02/10/2014  BP/Weight 03/03/2015 01/28/2015 10/21/2014  Systolic BP 116 116 127  Diastolic BP 79 81 75  Wt. (Lbs) 172 171 175  BMI 34.72 34.52 32   Physical Exam  Constitutional: She appears well-developed and well-nourished. No distress.  Cardiovascular: Normal rate, regular rhythm, normal heart sounds and intact distal pulses.   Pulmonary/Chest: Effort normal and breath sounds normal.    Genitourinary: Vagina normal and uterus normal. Pelvic exam was performed with patient prone. There is no rash, tenderness or lesion on the right labia. There is no rash, tenderness or lesion on the left labia. Cervix exhibits no motion tenderness, no discharge and no friability. No vaginal discharge found.    Musculoskeletal: She exhibits no edema.  Lymphadenopathy:       Right: No inguinal adenopathy present.       Left: No inguinal adenopathy present.  Skin: Skin is warm and dry. No rash noted.    Assessment & Plan:   Problem List Items Addressed This Visit    Pap smear for cervical cancer screening - Primary   Relevant Orders   Cytology - PAP [place]      No orders of the defined types were placed in this encounter.    Follow-up: No Follow-up on file.   Dessa Phi MD

## 2015-03-04 ENCOUNTER — Telehealth: Payer: Self-pay | Admitting: *Deleted

## 2015-03-04 LAB — CERVICOVAGINAL ANCILLARY ONLY
Chlamydia: NEGATIVE
NEISSERIA GONORRHEA: NEGATIVE
WET PREP (BD AFFIRM): NEGATIVE

## 2015-03-04 LAB — CYTOLOGY - PAP

## 2015-03-04 NOTE — Telephone Encounter (Signed)
-----   Message from Dessa Phi, MD sent at 03/04/2015 12:13 PM EDT ----- Neg wet prep

## 2015-03-04 NOTE — Telephone Encounter (Signed)
-----   Message from Josalyn Funches, MD sent at 03/04/2015 10:15 AM EDT ----- Neg Gc/chlam 

## 2015-03-04 NOTE — Telephone Encounter (Signed)
Date of birth verified by pt Neg wet and Gc results given to pt Results given in Spanish Pt verbalized understanding

## 2015-03-08 ENCOUNTER — Telehealth: Payer: Self-pay | Admitting: *Deleted

## 2015-03-08 NOTE — Telephone Encounter (Signed)
-----   Message from Dessa Phi, MD sent at 03/05/2015  6:02 PM EDT ----- Negative pap, repeat in 3-5 years

## 2015-03-08 NOTE — Telephone Encounter (Signed)
Date of birth verified by pt Normal results given to pt Results given in Spanish Pt verbalized understanding

## 2015-04-08 ENCOUNTER — Ambulatory Visit: Payer: Self-pay | Attending: Internal Medicine | Admitting: Pharmacist

## 2015-04-08 DIAGNOSIS — Z23 Encounter for immunization: Secondary | ICD-10-CM | POA: Insufficient documentation

## 2015-04-08 MED ORDER — INFLUENZA VAC SPLIT QUAD 0.5 ML IM SUSY
0.5000 mL | PREFILLED_SYRINGE | Freq: Once | INTRAMUSCULAR | Status: AC
  Administered 2015-04-08: 0.5 mL via INTRAMUSCULAR

## 2015-04-08 NOTE — Patient Instructions (Signed)
Influenza Virus Vaccine injection Qu es este medicamento? La VACUNA CONTRA LA INFLUENZA ayuda a disminuir el riesgo de contraer la influenza, tambin conocida como la gripe. La vacuna solo ayuda a protegerle contra algunas cepas de la gripe. Este medicamento puede ser utilizado para otros usos; si tiene alguna pregunta consulte con su proveedor de atencin mdica o con su farmacutico. Qu le debo informar a mi profesional de la salud antes de tomar este medicamento? Necesita saber si usted presenta alguno de los siguientes problemas o situaciones: -trastorno de sangrado como hemofilia -fiebre o infeccin -sndrome de Guillain-Barre u otros problemas neurolgicos -problemas del sistema inmunolgico -infeccin por el virus de la inmunodeficiencia humana (VIH) o SIDA -niveles bajos de plaquetas en la sangre -esclerosis mltiple -una reaccin alrgica o inusual a las vacunas antigripales, ltex, a otros medicamentos, alimentos, colorantes o conservantes. Distintas marcas de vacunas contienen [employer]. Algunas pueden contener ltex o huevos. Hable con su mdico acerca de sus alergias para asegurarse de que obtendr la vacuna adecuada para usted. -si est embarazada o buscando quedar embarazada -si est amamantando a un beb Cmo debo utilizar este medicamento? Esta vacuna se administra mediante inyeccin por va intramuscular o va subcutnea. Lo administra un profesional de [employer]. Recibir una copia de informacin escrita sobre la vacuna antes de cada vacuna. Asegrese de leer este folleto cada vez cuidadosamente. Este folleto puede cambiar con frecuencia. Hable con su proveedor de atencin medicamento para saber cules vacunas son adecuadas para usted. Algunas vacunas no se deben [employer] Corporation grupos de [place]. Sobredosis: Pngase en contacto inmediatamente con un centro toxicolgico o una sala de urgencia si usted cree que haya tomado demasiado medicamento. ATENCIN: [employer] es solo para usted. No comparta este medicamento con nadie. Qu sucede si me olvido de una dosis? No se aplica en este caso. Qu puede interactuar con este medicamento? -quimioterapia o radioterapia -medicamentos que suprimen el sistema inmunolgico, tales como etanercept, anakinra, infliximab y adalimumab -medicamentos que tratan o previenen cogulos sanguneos, como warfarina -fenitona -medicamentos esteroideos, como la prednisona o la cortisona -teofilina -vacunas Puede ser que esta lista no menciona todas las posibles interacciones. Informe a su profesional de [employer] de [employer] productos a base de hierbas, medicamentos de [place] o suplementos nutritivos que est tomando. Si usted fuma, consume bebidas alcohlicas o si utiliza drogas ilegales, indqueselo tambin a su profesional de [employer]. Algunas sustancias pueden interactuar con su medicamento. A qu debo estar atento al usar PPL Corporation? Informe a su mdico o a Producer, television/film/video de la [employer] todos los efectos secundarios que persistan despus de [address]. Llame a su proveedor de atencin mdica si se presentan sntomas inusuales dentro de las 6 semanas posteriores a la vacunacin. Es posible que todava pueda contraer la gripe, pero la enfermedad no ser tan fuerte como normalmente. No puede contraer la gripe de esta vacuna. La vacuna antigripal no le protege contra resfros u otras enfermedades que pueden causar [place]. Debe vacunarse cada ao. Qu efectos secundarios puedo tener al [employer] este medicamento? Efectos secundarios que debe informar a su mdico o a Producer, television/film/video de la salud tan pronto como sea posible: -Therapist, art como erupcin cutnea, picazn o urticarias, hinchazn de la cara, labios o lengua Efectos secundarios que, por lo general, no requieren atencin mdica (debe informarlos a su mdico o a su profesional de la salud si persisten o si son molestos): -fiebre -dolor de  cabeza -molestias y dolores musculares -dolor, sensibilidad,  enrojecimiento o hinchazn en el lugar de la inyeccin -cansancio Puede ser que esta lista no menciona todos los posibles efectos secundarios. Comunquese a su mdico por asesoramiento mdico Hewlett-Packardsobre los efectos secundarios. Usted puede informar los efectos secundarios a la FDA por telfono al 1-800-FDA-1088. Dnde debo guardar mi medicina? Esta Automotive engineervacunase le administrar un profesional de la salud en una [place], [place], consultorio mdico u otro consultorio de un profesional de la salud. No se le suministrar esta vacuna para guardar en su domicilio. ATENCIN: Este folleto es un resumen. Puede ser que no cubra toda la posible informacin. Si usted tiene preguntas acerca de esta medicina, consulte con su mdico, su farmacutico o su profesional de Radiographer, therapeuticla salud.    2016, Elsevier/Gold Standard. (2014-07-29 00:00:00)

## 2015-04-13 ENCOUNTER — Ambulatory Visit: Payer: Self-pay | Attending: Family Medicine

## 2015-08-03 ENCOUNTER — Ambulatory Visit: Payer: Self-pay | Admitting: Family Medicine

## 2015-08-05 ENCOUNTER — Ambulatory Visit: Payer: Self-pay | Attending: Family Medicine | Admitting: Family Medicine

## 2015-08-05 ENCOUNTER — Encounter: Payer: Self-pay | Admitting: Family Medicine

## 2015-08-05 VITALS — BP 131/86 | HR 77 | Temp 98.1°F | Resp 16 | Ht 59.0 in | Wt 168.0 lb

## 2015-08-05 DIAGNOSIS — R1013 Epigastric pain: Secondary | ICD-10-CM | POA: Insufficient documentation

## 2015-08-05 DIAGNOSIS — J069 Acute upper respiratory infection, unspecified: Secondary | ICD-10-CM | POA: Insufficient documentation

## 2015-08-05 DIAGNOSIS — R05 Cough: Secondary | ICD-10-CM | POA: Insufficient documentation

## 2015-08-05 DIAGNOSIS — J019 Acute sinusitis, unspecified: Secondary | ICD-10-CM | POA: Insufficient documentation

## 2015-08-05 DIAGNOSIS — K219 Gastro-esophageal reflux disease without esophagitis: Secondary | ICD-10-CM | POA: Insufficient documentation

## 2015-08-05 DIAGNOSIS — R5383 Other fatigue: Secondary | ICD-10-CM | POA: Insufficient documentation

## 2015-08-05 MED ORDER — OMEPRAZOLE 20 MG PO CPDR: 20.0000 mg | DELAYED_RELEASE_CAPSULE | Freq: Every day | ORAL | Status: DC

## 2015-08-05 MED ORDER — AMOXICILLIN 500 MG PO CAPS: 500.0000 mg | ORAL_CAPSULE | Freq: Three times a day (TID) | ORAL | Status: DC

## 2015-08-05 NOTE — Progress Notes (Signed)
   Subjective:  Patient ID: Sheri Mann, female    DOB: [birth year]  Age: 50 y.o. MRN: [id number]  CC: Abdominal Pain  Spanish interpreter used   HPI Sheri Mann presents for   1.  Epigastric pain: started 2 months ago. Getting better. Avoid spicy and carbonated foods. No nausea or vomiting. Having reflux. No fatigue.   2. Cold symptoms: x 4 days. Fatigue. No fever or chills. Husband also sick. No CP or SOB. Having dry cough.    Social History  Substance Use Topics  . Smoking status: Never Smoker   . Smokeless tobacco: Never Used  . Alcohol Use: No   Outpatient Prescriptions Prior to Visit  Medication Sig Dispense Refill  . ibuprofen (ADVIL,MOTRIN) 600 MG tablet Take 1 tablet (600 mg total) by mouth every 8 (eight) hours as needed. 30 tablet 0   No facility-administered medications prior to visit.    ROS Review of Systems  Constitutional: Positive for fatigue. Negative for fever and chills.  Eyes: Negative for visual disturbance.  Respiratory: Positive for cough. Negative for shortness of breath.   Cardiovascular: Negative for chest pain.  Gastrointestinal: Positive for abdominal pain. Negative for blood in stool.  Musculoskeletal: Positive for myalgias and arthralgias. Negative for back pain.  Skin: Negative for rash.  Allergic/Immunologic: Negative for immunocompromised state.  Neurological: Positive for headaches.  Hematological: Negative for adenopathy. Does not bruise/bleed easily.  Psychiatric/Behavioral: Negative for suicidal ideas and dysphoric mood.    Objective:  BP 131/86 mmHg  Pulse 77  Temp(Src) 98.1 F (36.7 C) (Oral)  Resp 16  Ht  (1.499 m)  Wt 168 lb (76.204 kg)  BMI 33.91 kg/m2  SpO2 99%  BP/Weight 08/05/2015 03/03/2015 01/28/2015  Systolic BP 131 116 116  Diastolic BP 86 79 81  Wt. (Lbs) 168 172 171  BMI 33.91 34.72 34.52    Physical Exam  Constitutional: She is oriented to person, place, and time. She appears well-developed  and well-nourished. No distress.  HENT:  Head: Normocephalic and atraumatic.  Right Ear: Tympanic membrane, external ear and ear canal normal.  Left Ear: Tympanic membrane, external ear and ear canal normal.  Nose: Mucosal edema present.  Mouth/Throat: Oropharynx is clear and moist.  Cardiovascular: Normal rate, regular rhythm, normal heart sounds and intact distal pulses.   Pulmonary/Chest: Effort normal and breath sounds normal.  Musculoskeletal: She exhibits no edema.  Lymphadenopathy:    She has no cervical adenopathy.  Neurological: She is alert and oriented to person, place, and time.  Skin: Skin is warm and dry. No rash noted.  Psychiatric: She has a normal mood and affect.     Assessment & Plan:   Irisha was seen today for abdominal pain and uri.  Diagnoses and all orders for this visit:  Abdominal pain, epigastric -     H. pylori breath test -     Cancel: CBC -     omeprazole (PRILOSEC) 20 MG capsule; Take 1 capsule (20 mg total) by mouth daily.  Acute sinusitis, recurrence not specified, unspecified location -     amoxicillin (AMOXIL) 500 MG capsule; Take 1 capsule (500 mg total) by mouth 3 (three) times daily.    No orders of the defined types were placed in this encounter.    Follow-up: No Follow-up on file.   Dessa Phi MD

## 2015-08-05 NOTE — Patient Instructions (Addendum)
Sheri Mann was seen today for abdominal pain and uri.  Diagnoses and all orders for this visit:  Abdominal pain, epigastric -     H. pylori breath test -     Cancel: CBC -     omeprazole (PRILOSEC) 20 MG capsule; Take 1 capsule (20 mg total) by mouth daily.  Acute sinusitis, recurrence not specified, unspecified location -     amoxicillin (AMOXIL) 500 MG capsule; Take 1 capsule (500 mg total) by mouth 3 (three) times daily.   1. Drink plenty of fluids. Hot tea, soup etc will help open your nasal passages. 2. Dextromethorphan 30 mg every 6-8 hrs (plain Robitussin) for cough suppression 3. Tylenol for pain up to 1000 mg three times daily.  4. Nasal saline-OTC nose spray for congestion.   F/u for chest pain, shortness of breath, persistent high fever.  You will be called with H. Pylori results   F/u in 6 weeks   Dr. Armen Pickup   Opciones de alimentos para pacientes con reflujo gastroesofgico - Adultos (Food Choices for Gastroesophageal Reflux Disease, Adult) Cuando se tiene reflujo gastroesofgico (ERGE), los alimentos que se ingieren y los hbitos de alimentacin son Engineer, production. Elegir los alimentos adecuados puede ayudar a Paramedic las molestias ocasionadas por el [place]. QU PAUTAS GENERALES DEBO SEGUIR?  Elija las frutas, los vegetales, los cereales integrales, los productos lcteos, la carne de [place], de pescado y de ave con bajo contenido de grasas.  Limite las grasas, [address] [place], los aderezos para [place], la [place], los frutos secos y Programme researcher, broadcasting/film/video.  Lleve un registro de las comidas para identificar los alimentos que ocasionan sntomas.  Evite los alimentos que le ocasionen reflujo. Pueden ser distintos para cada persona.  Haga comidas pequeas con frecuencia en lugar de tres comidas [employer].  Coma lentamente, en un clima distendido.  Limite el consumo de alimentos fritos.  Cocine los alimentos utilizando mtodos que no sean la  fritura.  Evite el consumo alcohol.  Evite beber grandes cantidades de lquidos con las comidas.  Evite agacharse o recostarse hasta despus de 2 o 3horas de haber comido. QU ALIMENTOS NO SE RECOMIENDAN? Los siguientes son algunos alimentos y bebidas que pueden empeorar los sntomas: Veterinary surgeon. Jugo de tomate. Salsa de tomate y espagueti. Ajes. [place] y [place]. Rbano picante. Frutas Naranjas, pomelos y limn (fruta y [place]). Carnes Carnes de [place], de pescado y de ave con gran contenido de grasas. Esto incluye los perros calientes, las [place], el [place], la salchicha, el salame y el tocino. Lcteos [place] entera y [place]. PPG [employer]. Crema. [place]. Helados. Queso crema.  Bebidas Caf y t negro, con o sin cafena Bebidas gaseosas o energizantes. Condimentos Salsa picante. Salsa barbacoa.  Dulces/postres Chocolate y cacao. [place]. Menta y mentol. Grasas y [employer] con alto contenido de grasas, incluidas las papas fritas. Otros Vinag[place]. Especias picantes, como la [employer], la pimienta blanca, la pimienta roja, la pimienta de cayena, el curry en [place], los clavos de [place], el jengibre y el [place] en polvo. Los artculos mencionados arriba pueden no ser Raytheon de las bebidas y los alimentos que se Theatre stage manager. Comunquese con el nutricionista para recibir ms informacin.   Esta informacin no tiene Theme park manager el consejo del mdico. Asegrese de hacerle al mdico cualquier pregunta que tenga.   Document Released: 03/15/2005 Document Revised: 06/26/2014 Elsevier Interactive Patient Education [employer]

## 2015-08-05 NOTE — Assessment & Plan Note (Signed)
A; suspect GERD, r/o H pylori P: Breath test  PPI

## 2015-08-05 NOTE — Progress Notes (Signed)
C/C Flu Sx, exposited to Flu virus  Abdominal pain and burning , body ache Pain scale # 5 No tobacco user  No suicidal thought in the past two weeks

## 2015-08-06 LAB — H. PYLORI BREATH TEST: H. pylori Breath Test: NOT DETECTED

## 2015-08-12 ENCOUNTER — Telehealth: Payer: Self-pay | Admitting: *Deleted

## 2015-08-12 NOTE — Telephone Encounter (Signed)
-----   Message from Dessa Phi, MD sent at 08/06/2015  5:57 PM EST ----- Negative screening H pylori

## 2015-08-12 NOTE — Telephone Encounter (Signed)
Date of birth verified by pt  Lab results given  Pt verbalized understanding  Information given in Spanish  

## 2015-11-25 ENCOUNTER — Ambulatory Visit: Payer: Self-pay | Attending: Family Medicine

## 2015-12-30 ENCOUNTER — Ambulatory Visit: Payer: Self-pay | Attending: Family Medicine | Admitting: Physician Assistant

## 2015-12-30 VITALS — BP 135/86 | HR 72 | Temp 98.4°F | Resp 16 | Wt 168.8 lb

## 2015-12-30 DIAGNOSIS — G4452 New daily persistent headache (NDPH): Secondary | ICD-10-CM

## 2015-12-30 DIAGNOSIS — M25562 Pain in left knee: Secondary | ICD-10-CM

## 2015-12-30 LAB — CBC WITH DIFFERENTIAL/PLATELET
BASOS PCT: 0 %
Basophils Absolute: 0 cells/uL (ref 0–200)
EOS ABS: 45 {cells}/uL (ref 15–500)
EOS PCT: 1 %
HEMATOCRIT: 39.6 % (ref 35.0–45.0)
Hemoglobin: 13.2 g/dL (ref 11.7–15.5)
LYMPHS PCT: 43 %
Lymphs Abs: 1935 cells/uL (ref 850–3900)
MCH: 28.6 pg (ref 27.0–33.0)
MCHC: 33.3 g/dL (ref 32.0–36.0)
MCV: 85.9 fL (ref 80.0–100.0)
MPV: 13.1 fL — AB (ref 7.5–12.5)
Monocytes Absolute: 270 cells/uL (ref 200–950)
Monocytes Relative: 6 %
Neutro Abs: 2250 cells/uL (ref 1500–7800)
Neutrophils Relative %: 50 %
Platelets: 184 10*3/uL (ref 140–400)
RBC: 4.61 MIL/uL (ref 3.80–5.10)
RDW: 13.5 % (ref 11.0–15.0)
WBC: 4.5 10*3/uL (ref 3.8–10.8)

## 2015-12-30 MED ORDER — NAPROXEN 500 MG PO TABS: 500.0000 mg | ORAL_TABLET | Freq: Two times a day (BID) | ORAL | Status: DC

## 2015-12-30 MED ORDER — METHOCARBAMOL 500 MG PO TABS
500.0000 mg | ORAL_TABLET | Freq: Four times a day (QID) | ORAL | Status: DC
Start: 2015-12-30 — End: 2016-03-17

## 2015-12-30 NOTE — Progress Notes (Signed)
Patient ID: Sheri Mann, female   DOB: 03-29-1966, 50 y.o.   MRN: [id number]   Sheri Mann, is a 50 y.o. female  AOZ:[id number]  [id number]:[id number]  DOB - 16-Nov-1965  Chief Complaint  Patient presents with  . Knee Pain  . Headache        Subjective:  Chief Complaint and HPI: Sheri Mann is a 50 y.o. female here today for 1) R sided headache for several days. 2) L knee pain for >1 month.  [employer] interpreter "Manny" interpreting.  1)HA present on R side of head for about 4-5 days.  No vision changes.  Occasional R eye pain since lacrimal duct surgery about 3-4 years ago.  She denies dizziness.  Pain is full R side of head and face and into upper neck on R.  No precipitating factos.  Advil helps temporarily. No f/c.  2)L knee pain -NKI-similar pain about 1 year ago and she had to have her knee drained.  She has been having L knee pain for a little over a month this episode.  Tylenol, advil, or rest provide some relief.     ROS:   Constitutional:  No f/c, No night sweats, No unexplained weight loss. EENT:  No vision changes, No blurry vision, No hearing changes. No mouth, throat, or ear problems.  Respiratory: No cough, No SOB Cardiac: No CP, no palpitations GI:  No abd pain, No N/V/D. GU: No Urinary s/sx Musculoskeletal: No joint pain other than L knee pain Neuro: +headache, no dizziness, no motor weakness.  Skin: No rash Endocrine:  No polydipsia. No polyuria.  Psych: Denies SI/HI  No problems updated.  ALLERGIES: No Known Allergies  PAST MEDICAL HISTORY: Past Medical History  Diagnosis Date  . Elevated BP 2013   . High cholesterol 2013   . Hypertension     MEDICATIONS AT HOME: Prior to Admission medications   Medication Sig Start Date End Date Taking? Authorizing Provider  ibuprofen (ADVIL,MOTRIN) 600 MG tablet Take 1 tablet (600 mg total) by mouth every 8 (eight) hours as needed. 01/28/15  Yes Josalyn Funches, MD  omeprazole (PRILOSEC) 20 MG capsule Take 1  capsule (20 mg total) by mouth daily. Patient not taking: Reported on 12/30/2015 08/05/15   Dessa Phi, MD     Objective:  EXAM:   Filed Vitals:   12/30/15 0959  BP: 135/86  Pulse: 72  Temp: 98.4 F (36.9 C)  TempSrc: Oral  Resp: 16  Weight: 168 lb 12.8 oz (76.567 kg)  SpO2: 98%    General appearance : A&OX3. NAD. Non-toxic-appearing HEENT: Atraumatic and Normocephalic.  PERRLA. EOM intact.  TM clear B. Mouth-MMM, post pharynx WNL w/o erythema, No PND.  Normal facial symmetry Neck: supple, no JVD. No cervical lymphadenopathy. No thyromegaly Chest/Lungs:  Breathing-non-labored, Good air entry bilaterally, breath sounds normal without rales, rhonchi, or wheezing  CVS: S1 S2 regular, no murmurs, gallops, rubs  Extremities: Bilateral Lower Ext shows no edema, both legs are warm to touch with = pulse throughout. L and R knee examined. R kne exam normal.  L knee without ballotment or effusion.  Skin is warm, dry, and intact without erythema or induration.  There is TTP medial aspect of knee, but ligaments and joint stable. Neurology:  CN II-XII grossly intact, Non focal.   Psych:  TP linear. J/I WNL. Normal speech. Appropriate eye contact and affect.  Skin:  No Rash  Data Review Lab Results  Component Value Date   HGBA1C 5.90 01/28/2015  Assessment & Plan   1. New daily persistent headache - Comprehensive metabolic panel - CBC with Differential/Platelet - Sedimentation Rate Naproxen and methocarbamol X 7days  2. Knee pain, left Naproxen 500mg  bid X 1 week then prn - CBC with Differential/Platelet - Sedimentation Rate  Patient have been counseled extensively about nutrition and exercise  Return in about 2 weeks (around 01/13/2016) for recheck knee pain and headache with me-30 min slot.  The patient was given clear instructions to go to ER or return to medical center if symptoms don't improve, worsen or new problems develop. The patient verbalized understanding.  The patient was told to call to get lab results if they haven't heard anything in the next week.     Georgian CoAngela McClung, PA-C [facility] [place], [place] [phone]   12/30/2015, 10:33 AM

## 2015-12-30 NOTE — Patient Instructions (Signed)
Dolor de cabeza general sin causa (General Headache Without Cause) El dolor de cabeza es un dolor o malestar que se siente en la zona de la cabeza o del cuello. Puede no tener una causa especfica. Hay muchas causas y tipos de dolores de Turkmenistancabeza. Los dolores de cabeza ms comunes son los siguientes:  Cefalea tensional.  Cefaleas migraosas.  Cefalea en brotes.  Cefaleas diarias crnicas. INSTRUCCIONES PARA EL CUIDADO EN EL HOGAR  Controle su afeccin para ver si hay cambios. Siga estos pasos para Scientist, physiologicalcontrolar la afeccin: Control del Reynolds Americandolor  Tome los medicamentos de venta libre y los recetados solamente como se lo haya indicado el mdico.  Cuando sienta dolor de cabeza acustese en un cuarto oscuro y tranquilo.  Si se lo indican, aplique hielo sobre la cabeza y la zona del cuello:  Ponga el hielo en una bolsa plstica.  Coloque una toalla entre la piel y la bolsa de hielo.  Coloque el hielo durante 20minutos, 2 a 3veces por Futures traderda.  Utilice una almohadilla trmica o tome una ducha con agua caliente para aplicar calor en la cabeza y la zona del cuello como se lo haya indicado el mdico.  Mantenga las luces tenues si le Liz Claibornemolesta las luces brillantes o sus dolores de cabeza empeoran. Comida y bebida  Mantenga un horario para las comidas.  Limite el consumo de bebidas alcohlicas.  Consuma menos cantidad de cafena o deje de tomarla. Instrucciones generales  Concurra a todas las visitas de control como se lo haya indicado el mdico. Esto es importante.  Lleve un diario de los dolores de cabeza para Financial risk analystaveriguar qu factores pueden desencadenarlos. Por ejemplo, escriba los siguientes datos:  Lo que usted come y Estate agentbebe.  Cunto tiempo duerme.  Algn cambio en su dieta o en los medicamentos.  Pruebe algunas tcnicas de relajacin, como los Vega Altamasajes.  Limite el estrs.  Sintese con la espalda recta y no tense los msculos.  No consuma productos que contengan tabaco, incluidos  cigarrillos, tabaco de Theatre managermascar o cigarrillos electrnicos. Si necesita ayuda para dejar de fumar, consulte al mdico.  Haga actividad fsica habitualmente como se lo haya indicado el mdico.  Tenga un horario fijo para dormir. Duerma entre 7 y 9horas o la cantidad de horas que le haya recomendado el mdico. SOLICITE ATENCIN MDICA SI:   Los medicamentos no Materials engineerlogran aliviar los sntomas.  Tiene un dolor de cabeza que es diferente del dolor de cabeza habitual.  Tiene nuseas o vmitos.  Tiene fiebre. SOLICITE ATENCIN MDICA DE INMEDIATO SI:   El dolor se hace cada vez ms intenso.  Ha vomitado repetidas veces.  Presenta rigidez en el cuello.  Sufre prdida de la visin.  Tiene problemas para hablar.  Siente dolor en el ojo o en el odo.  Presenta debilidad muscular o prdida del control muscular.  Pierde el equilibrio o tiene problemas para Advertising account plannercaminar.  Sufre mareos o se desmaya.  Se siente confundido.   Esta informacin no tiene Theme park managercomo fin reemplazar el consejo del mdico. Asegrese de hacerle al mdico cualquier pregunta que tenga.   Document Released: 03/15/2005 Document Revised: 02/24/2015 Elsevier Interactive Patient Education 2016 [employer] Dolor de rodilla (Knee Pain) El dolor de rodilla es un sntoma muy comn y puede tener muchas causas. Suele desaparecer cuando se siguen las indicaciones del mdico en lo que respecta a Engineer, materialsaliviar el dolor y las molestias en la casa. Sin embargo, puede Scientist, product/process developmentprogresar hasta convertirse en una afeccin que requiere Stuarts Drafttratamiento. Algunas afecciones pueden incluir lo  siguiente:  Artritis por uso y desgaste (artrosis).  Artritis por hinchazn e irritacin (artritis reumatoide o gota).  Un quiste o un crecimiento en la rodilla.  Una infeccin en la articulacin de la rodilla.  Una lesin que no se [place].  Dao, hinchazn o irritacin de los tejidos que sostienen la rodilla (distensin de ligamentos o tendinitis). Si el dolor de Gaffer, tal vez haya que realizar ms estudios para Scientist, forensic, los cuales pueden incluir radiografas u otros estudios de diagnstico por imgenes de la rodilla. Adems, es posible que haya que extraerle lquido de la rodilla. El tratamiento del dolor continuo de rodilla depende de la causa, pero puede incluir lo siguiente:  Medicamentos para Engineer, materials o reducir la hinchazn.  Inyecciones de corticoides en la rodilla.  Fisioterapia.  Ciruga. INSTRUCCIONES PARA EL CUIDADO EN EL HOGAR  Tome los medicamentos solamente como se lo haya indicado el mdico.  Mantenga la rodilla en reposo y en alto (elevada) mientras est descansando.  No haga cosas que le causen dolor o que lo intensifiquen.  Evite las actividades o los ejercicios de alto impacto, como correr, Public relations account executive soga o hacer saltos de tijera.  Aplique hielo sobre la zona de la rodilla:  Ponga el hielo en una bolsa plstica.  Coloque una toalla entre la piel y la bolsa de hielo.  Coloque el hielo durante 20 minutos, 2 a 3 veces por da.  Pregntele al mdico si debe usar una Neurosurgeon.  Cuando duerma, pngase una almohada debajo de la rodilla.  Baje de peso si es necesario. El Edson extra puede generar presin en la rodilla.  No consuma ningn producto que contenga tabaco, lo que incluye cigarrillos, tabaco de Theatre manager o Administrator, Civil Service. Si necesita ayuda para dejar de fumar, consulte al mdico. Fumar puede retrasar la curacin de cualquier problema que tenga en el hueso y Nurse, learning disability. SOLICITE ATENCIN MDICA SI:  El dolor de rodilla contina, [place].  Tiene fiebre junto con dolor de rodilla.  La rodilla se le tuerce o se le traba.  La rodilla est ms hinchada. SOLICITE ATENCIN MDICA DE INMEDIATO SI:   La articulacin de la rodilla est caliente al tacto.  Tiene dolor en el pecho o dificultad para respirar.   Esta informacin no tiene Theme park manager el  consejo del mdico. Asegrese de hacerle al mdico cualquier pregunta que tenga.   Document Released: 11/22/2007 Document Revised: 06/26/2014 Elsevier Interactive Patient Education [employer]

## 2015-12-31 ENCOUNTER — Telehealth: Payer: Self-pay

## 2015-12-31 LAB — COMPREHENSIVE METABOLIC PANEL
ALK PHOS: 138 U/L — AB (ref 33–115)
ALT: 20 U/L (ref 6–29)
AST: 20 U/L (ref 10–35)
Albumin: 4.1 g/dL (ref 3.6–5.1)
BILIRUBIN TOTAL: 0.4 mg/dL (ref 0.2–1.2)
BUN: 10 mg/dL (ref 7–25)
CO2: 26 mmol/L (ref 20–31)
CREATININE: 0.65 mg/dL (ref 0.50–1.10)
Calcium: 9.4 mg/dL (ref 8.6–10.2)
Chloride: 107 mmol/L (ref 98–110)
Glucose, Bld: 99 mg/dL (ref 65–99)
Potassium: 4.6 mmol/L (ref 3.5–5.3)
SODIUM: 140 mmol/L (ref 135–146)
Total Protein: 6.9 g/dL (ref 6.1–8.1)

## 2015-12-31 LAB — SEDIMENTATION RATE: SED RATE: 23 mm/h — AB (ref 0–20)

## 2015-12-31 NOTE — Telephone Encounter (Signed)
[employer] Andrea Id# [id number] contacted patient to go over lab results pt did not answer lvm for patient to give me a call back at her earliest convenience   

## 2016-02-28 ENCOUNTER — Other Ambulatory Visit (HOSPITAL_COMMUNITY): Payer: Self-pay | Admitting: *Deleted

## 2016-02-28 DIAGNOSIS — N644 Mastodynia: Secondary | ICD-10-CM

## 2016-03-10 ENCOUNTER — Ambulatory Visit
Admission: RE | Admit: 2016-03-10 | Discharge: 2016-03-10 | Disposition: A | Discharge status: Home / Self Care | Payer: No Typology Code available for payment source | Source: Ambulatory Visit | Attending: Obstetrics and Gynecology | Admitting: Obstetrics and Gynecology

## 2016-03-10 ENCOUNTER — Ambulatory Visit (HOSPITAL_COMMUNITY)
Admission: RE | Admit: 2016-03-10 | Discharge: 2016-03-10 | Disposition: A | Discharge status: Home / Self Care | Payer: Self-pay | Source: Ambulatory Visit | Attending: Obstetrics and Gynecology | Admitting: Obstetrics and Gynecology

## 2016-03-10 ENCOUNTER — Encounter (HOSPITAL_COMMUNITY): Payer: Self-pay | Admitting: *Deleted

## 2016-03-10 VITALS — BP 130/84 | Temp 97.8°F | Ht 69.0 in | Wt 163.0 lb

## 2016-03-10 DIAGNOSIS — N644 Mastodynia: Secondary | ICD-10-CM

## 2016-03-10 DIAGNOSIS — Z1239 Encounter for other screening for malignant neoplasm of breast: Secondary | ICD-10-CM

## 2016-03-10 NOTE — Patient Instructions (Signed)
Explained breast self awareness to [name]. Patient did not need a Pap smear today due to last Pap smear and HPV typing was 03/03/2015. Let her know BCCCP will cover Pap smears and HPV typing every 5 years unless has a history of abnormal Pap smears. [facility]. Appointment scheduled for Friday, March 10, 2016 at 1440.  Lawerance CruelAntonieta Mann verbalized understanding.  [place], Kathaleen Maserhristine Poll, RN 2:45 PM

## 2016-03-10 NOTE — Progress Notes (Signed)
Complaints of left upper breast pain that radiates to the back x 15 days that comes and goes. Patient rates the pain at a 7-8 out of 10.  Pap Smear: Pap smear not completed today. [facility] and normal with negative HPV. Per patient has no history of an abnormal Pap smear. Last two Pap smear results are in EPIC.  Physical exam: Breasts Breasts symmetrical. No skin abnormalities bilateral breasts. No nipple retraction bilateral breasts. No nipple discharge bilateral breasts. No lymphadenopathy. No lumps palpated bilateral breasts. Complaints of left center breast tenderness on exam that was greater within the upper breast. [facility]. Appointment scheduled for Friday, March 10, 2016 at 1440.        Pelvic/Bimanual No Pap smear completed today since last Pap smear and HPV typing was 03/03/2015. Pap smear not indicated per BCCCP guidelines.   Smoking History: Patient has never smoked.  Patient Navigation: Patient education provided. Access to services provided for patient through [facility] program. Spanish interpreter provided  Used Spanish interpreter Nile RiggsMariel Gallego from [facility].

## 2016-03-14 ENCOUNTER — Encounter (HOSPITAL_COMMUNITY): Payer: Self-pay | Admitting: *Deleted

## 2016-03-17 ENCOUNTER — Ambulatory Visit: Payer: Self-pay | Attending: Family Medicine | Admitting: Physician Assistant

## 2016-03-17 ENCOUNTER — Encounter: Payer: Self-pay | Admitting: Physician Assistant

## 2016-03-17 VITALS — BP 110/72 | HR 74 | Temp 97.8°F | Resp 17 | Wt 168.0 lb

## 2016-03-17 DIAGNOSIS — M79622 Pain in left upper arm: Secondary | ICD-10-CM

## 2016-03-17 DIAGNOSIS — E7800 Pure hypercholesterolemia, unspecified: Secondary | ICD-10-CM | POA: Insufficient documentation

## 2016-03-17 DIAGNOSIS — Z23 Encounter for immunization: Secondary | ICD-10-CM

## 2016-03-17 DIAGNOSIS — M25522 Pain in left elbow: Secondary | ICD-10-CM

## 2016-03-17 DIAGNOSIS — R200 Anesthesia of skin: Secondary | ICD-10-CM | POA: Insufficient documentation

## 2016-03-17 DIAGNOSIS — M542 Cervicalgia: Secondary | ICD-10-CM

## 2016-03-17 DIAGNOSIS — M62838 Other muscle spasm: Secondary | ICD-10-CM

## 2016-03-17 MED ORDER — METHOCARBAMOL 500 MG PO TABS: 500.0000 mg | ORAL_TABLET | Freq: Four times a day (QID) | ORAL | 1 refills | Status: DC

## 2016-03-17 MED ORDER — NAPROXEN 500 MG PO TABS: 500.0000 mg | ORAL_TABLET | Freq: Two times a day (BID) | ORAL | 0 refills | Status: DC

## 2016-03-17 NOTE — Progress Notes (Signed)
Pt is in today for left shoulder and breast pain  Pt states the pain is in the entire left arm from back to fingertips  Pt had a mammogram last week  Pt consents to flu shot today

## 2016-03-17 NOTE — Progress Notes (Signed)
Tahjae Durr, is a 50 y.o. female  WUJ:[id number]  [id number]  DOB - 1966/05/20  Subjective:  Chief Complaint and HPI: Sheri Mann is a 50 y.o. female here today for L arm and neck pain with occasional numbness.  The pain has also been present in her L breast so she went and had a mammogram on 03/10/2016 which was normal.  She denies CP, SOB, palpitations, or jaw pain.  The pain is not worse with activity but does seem worse after doing a lot of lifting or moving with her upper body.  No claudication type symptoms.  Pain usu lasts 2-3 hours when it happens and is helped with Naproxen.  No change in appetite.  No weakness or change in strength.    MMG reviewed.   ROS:   Constitutional:  No f/c, No night sweats, No unexplained weight loss. EENT:  No vision changes, No blurry vision, No hearing changes. No mouth, throat, or ear problems.  Respiratory: No cough, No SOB Cardiac: No CP, no palpitations GI:  No abd pain, No N/V/D. GU: No Urinary s/sx Musculoskeletal: L arm pain Neuro: No headache, no dizziness, no motor weakness.  Skin: No rash Endocrine:  No polydipsia. No polyuria.  Psych: Denies SI/HI  No problems updated.  ALLERGIES: No Known Allergies  PAST MEDICAL HISTORY: Past Medical History:  Diagnosis Date  . Elevated BP 2013   . High cholesterol 2013   . Hypertension     MEDICATIONS AT HOME: Prior to Admission medications   Medication Sig Start Date End Date Taking? Authorizing Provider  ibuprofen (ADVIL,MOTRIN) 600 MG tablet Take 1 tablet (600 mg total) by mouth every 8 (eight) hours as needed. 01/28/15  Yes Josalyn Funches, MD  methocarbamol (ROBAXIN) 500 MG tablet Take 1 tablet (500 mg total) by mouth 4 (four) times daily. X 7 days then prn muscle spasm 03/17/16  Yes Marzella Schlein McClung, PA-C  naproxen (NAPROSYN) 500 MG tablet Take 1 tablet (500 mg total) by mouth 2 (two) times daily with a meal. X 7 days then prn pain 03/17/16  Yes Marzella Schlein McClung, PA-C    omeprazole (PRILOSEC) 20 MG capsule Take 1 capsule (20 mg total) by mouth daily. 08/05/15  Yes Josalyn Funches, MD     Objective:  EXAM:   Vitals:   03/17/16 1040  BP: 110/72  Pulse: 74  Resp: 17  Temp: 97.8 F (36.6 C)  TempSrc: Oral  SpO2: 97%  Weight: 168 lb (76.2 kg)    General appearance : A&OX3. NAD. Non-toxic-appearing HEENT: Atraumatic and Normocephalic.  PERRLA. EOM intact.  Neck: supple, no JVD. No cervical lymphadenopathy. No thyromegaly Chest/Lungs:  Breathing-non-labored, Good air entry bilaterally, breath sounds normal without rales, rhonchi, or wheezing  CVS: S1 S2 regular, no murmurs, gallops, rubs  Neck: No c-spine TTP.  Full S&ROM.  There is +spasm of the L trapezius into the neck and L upper back that is TTP.  Extremities: BUE with full S&ROM.  Grip and sensory are intact.  DTR=B. Bilateral Lower Ext shows no edema, both legs are warm to touch with = pulse throughout Neurology:  CN II-XII grossly intact, Non focal.   Psych:  TP linear. J/I WNL. Normal speech. Appropriate eye contact and affect.  Skin:  No Rash  Data Review Lab Results  Component Value Date   HGBA1C 5.90 01/28/2015     Assessment & Plan   1. Muscle spasm - methocarbamol (ROBAXIN) 500 MG tablet; Take 1 tablet (500 mg total) by  mouth 4 (four) times daily. X 7 days then prn muscle spasm  Dispense: 90 tablet; Refill: 1  2. Neck pain/Pain in joint, upper arm, left - naproxen (NAPROSYN) 500 MG tablet; Take 1 tablet (500 mg total) by mouth 2 (two) times daily with a meal. X 7 days then prn pain  Dispense: 60 tablet; Refill: 0  3. Encounter for immunization - Flu Vaccine QUAD 36+ mos IM   Patient have been counseled extensively about nutrition and exercise  Return in about 1 week (around 03/24/2016) for f/up with me/arm and neck pain.  If no improvements, consider c-spine films/TSH  The patient was given clear instructions to go to ER or return to medical center if symptoms don't  improve, worsen or new problems develop. The patient verbalized understanding. The patient was told to call to get lab results if they haven't heard anything in the next week.     Georgian CoAngela McClung, PA-C [facility] [place], [place] [phone]   03/17/2016, 11:30 AMPatient ID: Lawerance Cruelntonieta Garcia, female   DOB: 1965/10/23, 50 y.o.   MRN: [id number]

## 2016-03-23 ENCOUNTER — Ambulatory Visit: Payer: No Typology Code available for payment source

## 2016-06-30 ENCOUNTER — Encounter: Payer: Self-pay | Admitting: Physician Assistant

## 2016-06-30 ENCOUNTER — Ambulatory Visit: Payer: Self-pay | Attending: Internal Medicine | Admitting: Physician Assistant

## 2016-06-30 VITALS — BP 111/75 | HR 72 | Temp 98.2°F | Resp 18 | Ht 60.0 in | Wt 169.4 lb

## 2016-06-30 DIAGNOSIS — I10 Essential (primary) hypertension: Secondary | ICD-10-CM | POA: Insufficient documentation

## 2016-06-30 DIAGNOSIS — M7062 Trochanteric bursitis, left hip: Secondary | ICD-10-CM | POA: Insufficient documentation

## 2016-06-30 DIAGNOSIS — R110 Nausea: Secondary | ICD-10-CM | POA: Insufficient documentation

## 2016-06-30 DIAGNOSIS — E7800 Pure hypercholesterolemia, unspecified: Secondary | ICD-10-CM | POA: Insufficient documentation

## 2016-06-30 DIAGNOSIS — M25552 Pain in left hip: Secondary | ICD-10-CM | POA: Insufficient documentation

## 2016-06-30 DIAGNOSIS — Z79899 Other long term (current) drug therapy: Secondary | ICD-10-CM | POA: Insufficient documentation

## 2016-06-30 MED ORDER — NAPROXEN 500 MG PO TABS: 500.0000 mg | ORAL_TABLET | Freq: Two times a day (BID) | ORAL | 0 refills | Status: DC

## 2016-06-30 MED ORDER — ONDANSETRON HCL 4 MG PO TABS: 4.0000 mg | ORAL_TABLET | Freq: Three times a day (TID) | ORAL | 0 refills | Status: DC | PRN

## 2016-06-30 NOTE — Progress Notes (Signed)
Sheri Mann, is a 51 y.o. female  [id number]  [id number]  DOB - February 08, 1966  Subjective:  Chief Complaint and HPI: Sheri Mann is a 51 y.o. female here today for L hip pain X 3 days and nausea without vomiting.  NKI.  Appetite is good.  No f/c.  No abdominal pain.  No dysuria.  No new or different activities.  Describes the pain in her L hip and Leg as "burning."  "Oscar" through [employer] is interpreting.  ROS:   Constitutional:  No f/c, No night sweats, No unexplained weight loss. EENT:  No vision changes, No blurry vision, No hearing changes. No mouth, throat, or ear problems.  Respiratory: No cough, No SOB Cardiac: No CP, no palpitations GI:  No abd pain, No V/D/C. +nausea GU: No Urinary s/sx Musculoskeletal: + L leg pain Neuro: No headache, no dizziness, no motor weakness.  Skin: No rash Endocrine:  No polydipsia. No polyuria.  Psych: Denies SI/HI  No problems updated.  ALLERGIES: No Known Allergies  PAST MEDICAL HISTORY: Past Medical History:  Diagnosis Date  . Elevated BP 2013   . High cholesterol 2013   . Hypertension     MEDICATIONS AT HOME: Prior to Admission medications   Medication Sig Start Date End Date Taking? Authorizing Provider  ibuprofen (ADVIL,MOTRIN) 600 MG tablet Take 1 tablet (600 mg total) by mouth every 8 (eight) hours as needed. 01/28/15   Josalyn Funches, MD  methocarbamol (ROBAXIN) 500 MG tablet Take 1 tablet (500 mg total) by mouth 4 (four) times daily. X 7 days then prn muscle spasm 03/17/16   Anders Simmonds, PA-C  naproxen (NAPROSYN) 500 MG tablet Take 1 tablet (500 mg total) by mouth 2 (two) times daily with a meal. X 7 days then prn pain 06/30/16   Anders Simmonds, PA-C  omeprazole (PRILOSEC) 20 MG capsule Take 1 capsule (20 mg total) by mouth daily. 08/05/15   Josalyn Funches, MD  ondansetron (ZOFRAN) 4 MG tablet Take 1 tablet (4 mg total) by mouth every 8 (eight) hours as needed for nausea or vomiting. 06/30/16   Anders Simmonds, PA-C     Objective:  EXAM:   Vitals:   06/30/16 1640  BP: 111/75  Pulse: 72  Resp: 18  Temp: 98.2 F (36.8 C)  TempSrc: Oral  SpO2: 98%  Weight: 169 lb 6.4 oz (76.8 kg)  Height: 5' (1.524 m)    General appearance : A&OX3. NAD. Non-toxic-appearing HEENT: Atraumatic and Normocephalic.  PERRLA. EOM intact.  TM clear B. Mouth-MMM, post pharynx WNL w/o erythema, No PND. Neck: supple, no JVD. No cervical lymphadenopathy. No thyromegaly Chest/Lungs:  Breathing-non-labored, Good air entry bilaterally, breath sounds normal without rales, rhonchi, or wheezing  CVS: S1 S2 regular, no murmurs, gallops, rubs  Abdomen: Bowel sounds present, Non tender and not distended with no gaurding, rigidity or rebound. Extremities: Bilateral Lower Ext shows no edema, both legs are warm to touch with = pulse throughout.  L leg/hip-skin is warm, dry and intact.  Full S&ROM.  There is TTP at the L trochanteric bursa without erythema or obvious swelling.   Neurology:  CN II-XII grossly intact, Non focal.   Psych:  TP linear. J/I WNL. Normal speech. Appropriate eye contact and affect.  Skin:  No Rash  Data Review Lab Results  Component Value Date   HGBA1C 5.90 01/28/2015     Assessment & Plan   1. Nausea without vomiting Suspect viral syndrome - ondansetron (ZOFRAN) 4 MG tablet;  Take 1 tablet (4 mg total) by mouth every 8 (eight) hours as needed for nausea or vomiting.  Dispense: 20 tablet; Refill: 0  2. Trochanteric bursitis of left hip - naproxen (NAPROSYN) 500 MG tablet; Take 1 tablet (500 mg total) by mouth 2 (two) times daily with a meal. X 7 days then prn pain  Dispense: 60 tablet; Refill: 0  Patient have been counseled extensively about nutrition and e  The patient was given clear instructions to go to ER or return to medical center if symptoms don't improve, worsen or new problems develop. The patient verbalized understanding. The patient was told to call to get lab results if  they haven't heard anything in the next week.     Georgian CoAngela Latoy Labriola, PA-C [facility] [place], [place] [phone]   06/30/2016, 4:50 PM

## 2016-06-30 NOTE — Patient Instructions (Signed)
Bursitis  (Bursitis)  La bursitis es la inflamación e irritación de una bursa, una de las pequeñas bolsas llenas de líquido que amortiguan y protegen las partes móviles del cuerpo. Estas bolsas se encuentran entre los huesos y los músculos, los ligamentos musculares o las áreas de piel cerca de los huesos. La bursa protege estas estructuras del desgaste que resulta del movimiento frecuente.  Cuando la bursa se inflama, causa dolor e hinchazón. Puede acumularse líquido dentro de la bolsa. La bursitis es más frecuente cerca de las articulaciones, especialmente de las rodillas, los codos, las caderas y los hombros.  CAUSAS  Las causas de la bursitis pueden ser las siguientes:  · Una lesión debido a:  ? Un golpe directo, como una caída sobre la rodilla o el codo.  ? El uso excesivo de una articulación (esfuerzo repetitivo).  · Infección. Puede producirse si se introducen bacterias a través de un corte o un rasguño cerca de una articulación.  · Enfermedades que causan inflamación articular, como gota y artritis reumatoide.  FACTORES DE RIESGO  Puede estar en riesgo de bursitis si:  · Tiene un trabajo o un pasatiempo que requiera mucho esfuerzo repetitivo en las articulaciones.  · Tiene una afección que debilite el sistema de defensa (sistema inmunitario) del cuerpo, como diabetes, cáncer o VIH.  · Levanta objetos y se estira hacia arriba con frecuencia.  · Se arrodilla o se apoya sobre superficies duras con frecuencia.  · Corre o camina con frecuencia.  SIGNOS Y SÍNTOMAS  Los signos y síntomas más frecuentes de la bursitis son los siguientes:  · Dolor que empeora cuando mueve la parte del cuerpo afectada o la carga con peso.  · Inflamación.  · Entumecimiento.  Otros signos y síntomas pueden incluir los siguientes:  · Enrojecimiento.  · Sensibilidad.  · Calor.  · Dolor que continúa después de descansar.  · Fiebre y escalofríos. Esto puede suceder cuando la causa de la bursitis es una infección.  DIAGNÓSTICO  La bursitis  puede diagnosticarse de las siguientes maneras:  · Examen físico e historia clínica.  · Resonancia magnética.  · Un procedimiento para drenar líquido de la bursa con una aguja (aspiración). El líquido puede examinarse para detectar signos de infección o gota.  · Análisis de sangre, para descartar otras causas de inflamación.  TRATAMIENTO  Por lo general, la bursitis puede tratarse en casa con reposo, hielo, compresión y elevación (RHCE). En el caso de bursitis leve, quizás solo se necesite un tratamiento que incluya reposo, hielo, compresión y elevación. Otros tratamientos pueden ser los siguientes:  · Antiinflamatorios no esteroides (AINE) para tratar el dolor y la inflamación.  · Corticoides para combatir la inflamación. Pueden inyectarle estos medicamentos en el área de la bursitis y a su alrededor.  · Aspiración del líquido de la bursitis para aliviar el dolor y mejorar el movimiento.  · Un antibiótico para tratar la infección de la bursa.  · Una férula, una tablilla o un dispositivo para caminar.  · Fisioterapia si sigue teniendo dolor o movimientos limitados.  · Cirugía para extraer una bursa infectada o dañada. Esta puede ser necesaria si tiene un caso de bursitis muy grave o si los otros tratamientos no han funcionado.  INSTRUCCIONES PARA EL CUIDADO EN EL HOGAR  · Tome los medicamentos solamente como se lo haya indicado el médico.  · Si le recetaron antibióticos, asegúrese de terminarlos, incluso si comienza a sentirse mejor.  · Haga reposo a fin de descansar el área afectada,   como se lo haya indicado el médico.  ? Mantenga el área elevada.  ? Evite las actividades que empeoren el dolor.  · Aplique hielo sobre la zona lesionada.  ? Coloque el hielo en una bolsa plástica.  ? Coloque una toalla entre la piel y la bolsa de hielo.  ? Coloque el hielo durante 20 minutos, 2 a 3 veces por día.  · Use la férula, el dispositivo ortopédico, la almohadilla o el dispositivo para caminar como se lo haya indicado el  médico.  · Concurra a todas las visitas de control como se lo haya indicado el médico. Esto es importante.    PREVENCIÓN  · Utilice rodilleras si se arrodilla con frecuencia.  · Use un calzado que le quede bien y que sea resistente para caminar o correr.  · Haga pausas con regularidad durante una actividad repetitiva.  · A la hora de precalentar, antes de hacer una actividad intensa, elongue.  · Mantenga un peso saludable o pierda peso según lo que le haya recomendado el médico. Pida ayuda al médico si es necesario.  · Haga ejercicios regularmente. Comience gradualmente cualquier actividad física nueva.    SOLICITE ATENCIÓN MÉDICA SI:  · La bursitis no responde al tratamiento o a los cuidados en el hogar.  · Tiene fiebre.  · Tiene escalofríos.    Esta información no tiene como fin reemplazar el consejo del médico. Asegúrese de hacerle al médico cualquier pregunta que tenga.  Document Released: 03/15/2005 Document Revised: 06/26/2014 Document Reviewed: 08/25/2013  Elsevier Interactive Patient Education © 2017 Elsevier Inc.

## 2016-06-30 NOTE — Progress Notes (Signed)
Patient is here for LEFT HIP PAIN  Patient has not taken medication today. Patient has eaten today.  Patient complains of left hip pain radiating down the leg. Pain is scaled at a 5 currently and is intermittent.

## 2016-08-28 ENCOUNTER — Ambulatory Visit: Payer: No Typology Code available for payment source

## 2016-09-06 ENCOUNTER — Ambulatory Visit: Payer: Self-pay | Attending: Family Medicine

## 2016-09-18 ENCOUNTER — Encounter (HOSPITAL_COMMUNITY): Payer: Self-pay | Admitting: Emergency Medicine

## 2016-09-18 DIAGNOSIS — I10 Essential (primary) hypertension: Secondary | ICD-10-CM | POA: Insufficient documentation

## 2016-09-18 DIAGNOSIS — K2900 Acute gastritis without bleeding: Secondary | ICD-10-CM | POA: Insufficient documentation

## 2016-09-18 DIAGNOSIS — Z79899 Other long term (current) drug therapy: Secondary | ICD-10-CM | POA: Insufficient documentation

## 2016-09-18 LAB — URINALYSIS, ROUTINE W REFLEX MICROSCOPIC
BACTERIA UA: NONE SEEN
Bilirubin Urine: NEGATIVE
GLUCOSE, UA: NEGATIVE mg/dL
Ketones, ur: NEGATIVE mg/dL
Leukocytes, UA: NEGATIVE
NITRITE: NEGATIVE
Protein, ur: NEGATIVE mg/dL
SPECIFIC GRAVITY, URINE: 1.019 (ref 1.005–1.030)
Squamous Epithelial / LPF: NONE SEEN
pH: 6 (ref 5.0–8.0)

## 2016-09-18 LAB — CBC
HCT: 36.7 % (ref 36.0–46.0)
Hemoglobin: 12.5 g/dL (ref 12.0–15.0)
MCH: 29.6 pg (ref 26.0–34.0)
MCHC: 34.1 g/dL (ref 30.0–36.0)
MCV: 86.8 fL (ref 78.0–100.0)
PLATELETS: 169 10*3/uL (ref 150–400)
RBC: 4.23 MIL/uL (ref 3.87–5.11)
RDW: 12.7 % (ref 11.5–15.5)
WBC: 5.6 10*3/uL (ref 4.0–10.5)

## 2016-09-18 LAB — COMPREHENSIVE METABOLIC PANEL
ALBUMIN: 3.9 g/dL (ref 3.5–5.0)
ALT: 21 U/L (ref 14–54)
AST: 21 U/L (ref 15–41)
Alkaline Phosphatase: 109 U/L (ref 38–126)
Anion gap: 5 (ref 5–15)
BILIRUBIN TOTAL: 0.6 mg/dL (ref 0.3–1.2)
BUN: 12 mg/dL (ref 6–20)
CALCIUM: 9.4 mg/dL (ref 8.9–10.3)
CO2: 29 mmol/L (ref 22–32)
CREATININE: 0.82 mg/dL (ref 0.44–1.00)
Chloride: 106 mmol/L (ref 101–111)
GFR calc Af Amer: >60 mL/min (ref >60)
GFR calc non Af Amer: >60 mL/min (ref >60)
GLUCOSE: 138 mg/dL — AB (ref 65–99)
Potassium: 3.3 mmol/L — ABNORMAL LOW (ref 3.5–5.1)
Sodium: 140 mmol/L (ref 135–145)
TOTAL PROTEIN: 6.9 g/dL (ref 6.5–8.1)

## 2016-09-18 LAB — LIPASE, BLOOD: Lipase: 12 U/L (ref 11–51)

## 2016-09-18 NOTE — ED Triage Notes (Signed)
Patient reports mid/low abdominal pain with nausea and ocasional diarrhea , denies emesis or fever .

## 2016-09-19 ENCOUNTER — Emergency Department (HOSPITAL_COMMUNITY): Payer: Self-pay

## 2016-09-19 ENCOUNTER — Emergency Department (HOSPITAL_COMMUNITY)
Admission: EM | Admit: 2016-09-19 | Discharge: 2016-09-19 | Disposition: A | Discharge status: Home / Self Care | Payer: Self-pay | Attending: Emergency Medicine | Admitting: Emergency Medicine

## 2016-09-19 ENCOUNTER — Encounter (HOSPITAL_COMMUNITY): Payer: Self-pay | Admitting: Radiology

## 2016-09-19 DIAGNOSIS — R1013 Epigastric pain: Secondary | ICD-10-CM

## 2016-09-19 DIAGNOSIS — K2900 Acute gastritis without bleeding: Secondary | ICD-10-CM

## 2016-09-19 LAB — I-STAT BETA HCG BLOOD, ED (MC, WL, AP ONLY): I-stat hCG, quantitative: <5 m[IU]/mL (ref <5)

## 2016-09-19 MED ORDER — SODIUM CHLORIDE 0.9 % IV BOLUS (SEPSIS)
500.0000 mL | Freq: Once | INTRAVENOUS | Status: AC
  Administered 2016-09-19: 500 mL via INTRAVENOUS

## 2016-09-19 MED ORDER — RANITIDINE HCL 150 MG PO TABS: 150.0000 mg | ORAL_TABLET | Freq: Two times a day (BID) | ORAL | 0 refills | Status: DC

## 2016-09-19 MED ORDER — IOPAMIDOL (ISOVUE-300) INJECTION 61%
INTRAVENOUS | Status: AC
  Administered 2016-09-19: 100 mL
  Filled 2016-09-19: qty 100

## 2016-09-19 MED ORDER — MORPHINE SULFATE (PF) 4 MG/ML IV SOLN
4.0000 mg | Freq: Once | INTRAVENOUS | Status: AC
  Administered 2016-09-19: 4 mg via INTRAVENOUS
  Filled 2016-09-19: qty 1

## 2016-09-19 MED ORDER — ONDANSETRON HCL 4 MG/2ML IJ SOLN
4.0000 mg | Freq: Once | INTRAMUSCULAR | Status: AC
  Administered 2016-09-19: 4 mg via INTRAVENOUS
  Filled 2016-09-19: qty 2

## 2016-09-19 MED ORDER — SUCRALFATE 1 G PO TABS: 1.0000 g | ORAL_TABLET | Freq: Three times a day (TID) | ORAL | 0 refills | Status: DC

## 2016-09-19 NOTE — ED Provider Notes (Signed)
[facility]-EMERGENCY DEPT Provider Note   CSN: [id number] Arrival date & time: 09/18/16  2050    By signing my name below, I, Valentino Saxon, attest that this documentation has been prepared under the direction and in the presence of Gilda Crease, MD. Electronically Signed: Valentino Saxon, ED Scribe. 09/19/16. 2:23 AM.  History   Chief Complaint Chief Complaint  Patient presents with  . Abdominal Pain   The history is provided by the patient and the spouse. No language interpreter was used.   HPI Comments: Sheri Mann is a 51 y.o. female with PMHx of who elevated BP, high cholesterol, HTN presents to the Emergency Department complaining of 8/10, gradual onset, constant, epigastric abdominal pain that occurred Sunday morning at ~4am. Pt reports associated intermittent nausea and diarrhea. Pt notes her diarrhea has subsided. She states her abdominal pain radiates to RUQ and LLQ into her lower back. No alleviating factors noted. Pt denies emesis and fever.   Past Medical History:  Diagnosis Date  . Elevated BP 2013   . High cholesterol 2013   . Hypertension     Patient Active Problem List   Diagnosis Date Noted  . Abdominal pain, epigastric 08/05/2015  . Headache 01/28/2015  . Perimenopausal vasomotor symptoms 01/28/2015  . Positive D dimer 08/24/2014  . Pleuritic chest pain 08/20/2014  . Left medial knee pain 08/20/2014  . Low back pain 04/10/2014  . Allergic rhinitis 04/10/2014  . Hematuria 04/10/2014    Past Surgical History:  Procedure Laterality Date  . BREAST LUMPECTOMY    . EYE SURGERY  2012  . TUBAL LIGATION  2000    OB History    Gravida Para Term Preterm AB Living   SAB TAB Ectopic Multiple Live Births           5       Home Medications    Prior to Admission medications   Medication Sig Start Date End Date Taking? Authorizing Provider  ibuprofen (ADVIL,MOTRIN) 600 MG tablet Take 1 tablet (600 mg total) by mouth every 8  (eight) hours as needed. 01/28/15   Josalyn Funches, MD  methocarbamol (ROBAXIN) 500 MG tablet Take 1 tablet (500 mg total) by mouth 4 (four) times daily. X 7 days then prn muscle spasm 03/17/16   Anders Simmonds, PA-C  naproxen (NAPROSYN) 500 MG tablet Take 1 tablet (500 mg total) by mouth 2 (two) times daily with a meal. X 7 days then prn pain 06/30/16   Anders Simmonds, PA-C  omeprazole (PRILOSEC) 20 MG capsule Take 1 capsule (20 mg total) by mouth daily. 08/05/15   Josalyn Funches, MD  ondansetron (ZOFRAN) 4 MG tablet Take 1 tablet (4 mg total) by mouth every 8 (eight) hours as needed for nausea or vomiting. 06/30/16   Anders Simmonds, PA-C  ranitidine (ZANTAC) 150 MG tablet Take 1 tablet (150 mg total) by mouth 2 (two) times daily. 09/19/16   Gilda Crease, MD  sucralfate (CARAFATE) 1 g tablet Take 1 tablet (1 g total) by mouth 4 (four) times daily -  with meals and at bedtime. 09/19/16   Gilda Crease, MD    Family History Family History  Problem Relation Age of Onset  . Cancer Neg Hx   . Heart disease Neg Hx   . Diabetes Neg Hx   . Hypertension Neg Hx     Social History Social History  Substance Use Topics  . Smoking  status: Never Smoker  . Smokeless tobacco: Never Used  . Alcohol use No     Allergies   Patient has no known allergies.   Review of Systems Review of Systems  Constitutional: Negative for fever.  Gastrointestinal: Positive for abdominal pain and nausea. Negative for vomiting.  All other systems reviewed and are negative.    Physical Exam Updated Vital Signs BP 106/69   Pulse 77   Temp 98.3 F (36.8 C) (Oral)   Resp 18   LMP 02/10/2014   SpO2 98%   Physical Exam  Constitutional: She is oriented to person, place, and time. She appears well-developed and well-nourished. No distress.  HENT:  Head: Normocephalic and atraumatic.  Right Ear: Hearing normal.  Left Ear: Hearing normal.  Nose: Nose normal.  Mouth/Throat: Oropharynx is clear  and moist and mucous membranes are normal.  Eyes: Conjunctivae and EOM are normal. Pupils are equal, round, and reactive to light.  Neck: Normal range of motion. Neck supple.  Cardiovascular: Regular rhythm, S1 normal and S2 normal.  Exam reveals no gallop and no friction rub.   No murmur heard. Pulmonary/Chest: Effort normal and breath sounds normal. No respiratory distress. She exhibits no tenderness.  Abdominal: Soft. Normal appearance and bowel sounds are normal. There is no hepatosplenomegaly. There is tenderness. There is no rebound, no guarding, no tenderness at McBurney's point and negative Murphy's sign. No hernia.  RUQ, Epigastric, LLQ abdominal tenderness.   Musculoskeletal: Normal range of motion.  Neurological: She is alert and oriented to person, place, and time. She has normal strength. No cranial nerve deficit or sensory deficit. Coordination normal. GCS eye subscore is 4. GCS verbal subscore is 5. GCS motor subscore is 6.  Skin: Skin is warm, dry and intact. No rash noted. No cyanosis.  Psychiatric: She has a normal mood and affect. Her speech is normal and behavior is normal. Thought content normal.  Nursing note and vitals reviewed.    ED Treatments / Results   DIAGNOSTIC STUDIES: Oxygen Saturation is 98% on RA, normal by my interpretation.    COORDINATION OF CARE: 2:22 AM Discussed treatment plan with pt at bedside which includes labs, abdominal CT imaging, pain medication and pt agreed to plan.  Labs (all labs ordered are listed, but only abnormal results are displayed) Labs Reviewed  COMPREHENSIVE METABOLIC PANEL - Abnormal; Notable for the following:       Result Value   Potassium 3.3 (*)    Glucose, Bld 138 (*)    All other components within normal limits  URINALYSIS, ROUTINE W REFLEX MICROSCOPIC - Abnormal; Notable for the following:    Hgb urine dipstick SMALL (*)    All other components within normal limits  LIPASE, BLOOD  CBC  I-STAT BETA HCG BLOOD, ED  ([facility], [facility], [facility] ONLY)    EKG  EKG Interpretation None       Radiology Ct Abdomen Pelvis W Contrast  Result Date: 09/19/2016 CLINICAL DATA:  Left lower quadrant abdominal pain with nausea and diarrhea for 2 days EXAM: CT ABDOMEN AND PELVIS WITH CONTRAST TECHNIQUE: Multidetector CT imaging of the abdomen and pelvis was performed using the standard protocol following bolus administration of intravenous contrast. CONTRAST:  ISOVUE-300 IOPAMIDOL (ISOVUE-300) INJECTION 61% COMPARISON:  None. FINDINGS: Lower chest: No acute abnormality. Hepatobiliary: No focal liver abnormality is seen. No gallstones, gallbladder wall thickening, or biliary dilatation. Pancreas: Unremarkable. No pancreatic ductal dilatation or surrounding inflammatory changes. Spleen: Normal in size without focal abnormality. Adrenals/Urinary Tract: Adrenal  glands are unremarkable. Kidneys are normal, without renal calculi, focal lesion, or hydronephrosis. Bladder is unremarkable. Stomach/Bowel: Stomach is within normal limits. Appendix is normal. Mild uncomplicated colonic diverticulosis. No evidence of bowel wall thickening, distention, or inflammatory changes. Vascular/Lymphatic: No significant vascular findings are present. No enlarged abdominal or pelvic lymph nodes. Reproductive: Uterus and bilateral adnexa are unremarkable. Other: No focal inflammation. No ascites. Small fat containing umbilical hernia. Musculoskeletal: No acute or significant osseous findings. IMPRESSION: Mild uncomplicated colonic diverticulosis. No focal inflammation. No acute findings. Electronically Signed   By: Ellery Plunk M.D.   On: 09/19/2016 03:52    Procedures Procedures (including critical care time)  Medications Ordered in ED Medications  sodium chloride 0.9 % bolus 500 mL (0 mLs Intravenous Stopped 09/19/16 0407)  morphine 4 MG/ML injection 4 mg (4 mg Intravenous Given 09/19/16 0259)  ondansetron (ZOFRAN) injection 4 mg (4 mg Intravenous Given  09/19/16 0259)  iopamidol (ISOVUE-300) 61 % injection (100 mLs  Contrast Given 09/19/16 0334)     Initial Impression / Assessment and Plan / ED Course  I have reviewed the triage vital signs and the nursing notes.  Pertinent labs & imaging results that were available during my care of the patient were reviewed by me and considered in my medical decision making (see chart for details).     Patient presents to the emergency department for evaluation of abdominal pain. Patient did have some tenderness in the right upper quadrant, epigastric area and left lower quadrant. There was, however, no signs of peritonitis or acute surgical process. Lab work is unremarkable. Pain was unexplained, however, therefore underwent CT scan. No acute abnormality was noted. Reviewing records does reveal a distant history of positive H. pylori. Will therefore treat with Carafate, ranitidine and follow-up with primary care to ensure resolution. May require GI follow-up.  Final Clinical Impressions(s) / ED Diagnoses   Final diagnoses:  Epigastric pain  Acute gastritis without hemorrhage, unspecified gastritis type    New Prescriptions New Prescriptions   RANITIDINE (ZANTAC) 150 MG TABLET    Take 1 tablet (150 mg total) by mouth 2 (two) times daily.   SUCRALFATE (CARAFATE) 1 G TABLET    Take 1 tablet (1 g total) by mouth 4 (four) times daily -  with meals and at bedtime.   I personally performed the services described in this documentation, which was scribed in my presence. The recorded information has been reviewed and is accurate.       Gilda Crease, MD 09/19/16 [phone]

## 2016-10-10 ENCOUNTER — Ambulatory Visit: Payer: Self-pay | Attending: Family Medicine | Admitting: Family Medicine

## 2016-10-10 ENCOUNTER — Encounter: Payer: Self-pay | Admitting: Family Medicine

## 2016-10-10 VITALS — BP 124/85 | HR 71 | Temp 98.3°F | Ht 60.0 in | Wt 162.2 lb

## 2016-10-10 DIAGNOSIS — R102 Pelvic and perineal pain: Secondary | ICD-10-CM | POA: Insufficient documentation

## 2016-10-10 DIAGNOSIS — M79605 Pain in left leg: Secondary | ICD-10-CM | POA: Insufficient documentation

## 2016-10-10 DIAGNOSIS — K5900 Constipation, unspecified: Secondary | ICD-10-CM | POA: Insufficient documentation

## 2016-10-10 DIAGNOSIS — R300 Dysuria: Secondary | ICD-10-CM | POA: Insufficient documentation

## 2016-10-10 DIAGNOSIS — Z79899 Other long term (current) drug therapy: Secondary | ICD-10-CM | POA: Insufficient documentation

## 2016-10-10 DIAGNOSIS — K5730 Diverticulosis of large intestine without perforation or abscess without bleeding: Secondary | ICD-10-CM | POA: Insufficient documentation

## 2016-10-10 DIAGNOSIS — M545 Low back pain, unspecified: Secondary | ICD-10-CM

## 2016-10-10 LAB — HEMOCCULT GUIAC POC 1CARD (OFFICE): FECAL OCCULT BLD: NEGATIVE

## 2016-10-10 LAB — POCT URINALYSIS DIPSTICK
Bilirubin, UA: NEGATIVE
Glucose, UA: NEGATIVE
Ketones, UA: NEGATIVE
Leukocytes, UA: NEGATIVE
Nitrite, UA: NEGATIVE
PROTEIN UA: NEGATIVE
SPEC GRAV UA: 1.015 (ref 1.010–1.025)
UROBILINOGEN UA: 0.2 U/dL
pH, UA: 7.5 (ref 5.0–8.0)

## 2016-10-10 MED ORDER — METHOCARBAMOL 500 MG PO TABS: 500.0000 mg | ORAL_TABLET | Freq: Every day | ORAL | 0 refills | Status: DC

## 2016-10-10 MED ORDER — METHYLPREDNISOLONE 4 MG PO TBPK: ORAL_TABLET | ORAL | 0 refills | Status: DC

## 2016-10-10 MED ORDER — POTASSIUM CHLORIDE CRYS ER 20 MEQ PO TBCR: 20.0000 meq | EXTENDED_RELEASE_TABLET | Freq: Every day | ORAL | 0 refills | Status: DC

## 2016-10-10 NOTE — Patient Instructions (Addendum)
Sheri Mann was seen today for pelvic pain and leg pain.  Diagnoses and all orders for this visit:  Pelvic pain -     POCT urinalysis dipstick -     POCT occult blood stool -     Cancel: CMP14+EGFR -     Cancel: CBC -     Cancel: Magnesium -     Urine culture  Low back pain radiating to left leg -     methylPREDNISolone (MEDROL DOSEPAK) 4 MG TBPK tablet; Taper per packet insert -     Discontinue: methocarbamol (ROBAXIN) 500 MG tablet; Take 1 tablet (500 mg total) by mouth at bedtime. -     potassium chloride SA (K-DUR,KLOR-CON) 20 MEQ tablet; Take 1 tablet (20 mEq total) by mouth daily. -     methocarbamol (ROBAXIN) 500 MG tablet; Take 1 tablet (500 mg total) by mouth at bedtime.  taper methylprednisolone  Day 1: 8 mg PO before breakfast, 4 mg after lunch and after dinner, and 8 mg at bedtime  Day 2: 4 mg PO before breakfast, after lunch, and after dinner and 8 mg at bedtime Day 3: 4 mg PO before breakfast, after lunch, after dinner, and at bedtime Day 4: 4 mg PO before breakfast, after lunch, and at bedtime  Day 5: 4 mg PO before breakfast and at bedtime Day 6: 4 mg PO before breakfast  f/u in 10-4 days for pelvic and back pain  Dr. Adrian Blackwater   Contenido de potasio de los alimentos (Potassium Content of Foods) El potasio es un mineral que se encuentra en muchos alimentos y bebidas. Ayuda a Contractor equilibrio de lquidos y [employer] organismo, e influye en la regularidad con la que el corazn late. El potasio tambin ayuda a Chief Technology Officer la presin arterial y a Electrical engineer y [place]. Algunos medicamentos y afecciones pueden cambiar el equilibrio de potasio en el cuerpo. Cuando esto sucede, puede ayudar a [employer] de potasio a travs de los alimentos que consume. El mdico o el nutricionista le recomendarn la cantidad de potasio que debe ingerir por Training and development officer. Las siguientes listas proporcionan la cantidad de potasio (entre parntesis) por  porcin en cada alimento. CON ALTO CONTENIDO DE POTASIO Los siguientes alimentos y bebidas tienen 200 mg o ms de potasio por porcin:  Damascos, 2 crudos o 5 secos (200 mg)  [place]a, 1 mediana (345 mg)  Aguacate, crudo, 1/4 (245 mg)  Banana, 1 mediana (425 mg)  Frijoles, lima o frijoles en salsa de tomates, enlatados, 1/2 taza (280 mg)  Frijoles blancos, enlatados, 1/2 taza (595 mg)  Carne asada, 3 onzas/85 g (320 mg)  Carne molida, 3 onzas/85 g (270 mg)  Remolachas, crudas o cocidas, 1/2 taza (260 mg)  Bollitos de salvado, 2 onzas/57 g (300 mg)  Brcoli, 1/2 taza (230 mg)  Repollitos de Bruselas, 1/2 taza (250 mg)  Meln, 1/2 taza (215 mg)  Cereales, 100 % de salvado, 1/2 taza (200 a 400 mg)  [place] de queso, sola, comida rpida, 1 (225 a 400 mg)  Pollo, 3 onzas/85 g (220 mg)  Almejas, enlatadas, 3 onzas/85 g (535 mg)  Cangrejo, 3 onzas/85 g (225 mg)  Dtiles, 5 (270 mg)  Frijoles y guisantes secos, 1/2 taza (300 a 475 mg)  Higos, secos, 2 (260 mg)  Pescado: halibut, atn, bacalao, pargo, 3 onzas/85 g (480 mg)  Pescado: salmn, abadejo, pez espada, perca, 3 onzas/85 g (300 mg)  Pescado: atn, enlatado, 3 onzas/85 g (  200 mg)  Papas fritas, comida rpida, 3 onzas/85 g (470 mg)  Granola con frutas y frutos secos, 1/2 taza (200 mg)  Jugo de pomelo, 1/2 taza (200 mg)  Hojas verdes de Teacher, English as a foreign language, 1/2 taza (655 mg)  Meln dulce, 1/2 taza (200 mg)  Col rizada, 1 taza (300 mg)  Kiwi, 1 mediano (240 mg)  Colirrbano, colinabo, chiriva, 1/2 taza (280 mg)  Lentejas, 1/2 taza (365 mg)  Mango, 1 (325 mg)  Leche con chocolate, 1 taza (420 mg)  Leche: descremada, parcialmente descremada, entera, suero de [place], 1 taza (350 a 380 mg)  Melaza, 1 cucharada (295 mg)  Championes, 1/2 taza (280 mg)  Nectarina, 1 (275 mg)  Frutos secos: almendras, manes, avellanas, nueces de [place], castaas de caj, mezclados, 1 onza/28 g (200 mg)  Frutos  secos: pistachos, 1 onza/28 g (295 mg)  Naranja, 1 (240 mg)  Jugo de naranja, 1/2 taza (235 mg)  Papaya, mediana, 1/2 fruta (390 mg)  [place] de man, con trozos, 2 cucharadas (240 mg)  [place] de man, sin trozos, 2 cucharadas (210 mg)  Pera, 1 mediana (200 mg)  Granada, 1 entera (400 mg)  Jugo de granada, 1/2 taza (215 mg)  Cerdo, 3 onzas/85 g (350 mg)  Papas fritas (de bolsa), 1 onza/85 g (465 mg)  Papa, asada, con piel, 1 mediana (925 mg)  Papas, hervidas, 1/2 taza (255 mg)  Papas, pur, 1/2 taza (330 mg)  Jugo de ciruela, 1/2 taza (370 mg)  Ciruelas, 5 (305 mg)  Pudin de chocolate, 1/2 taza (230 mg)  [place], enlatada, 1/2 taza (250 mg)  Pasas de uva, sin semilla, 1/4 taza (270 mg)  Semillas de girasol o calabaza, 1 onza/28 g (240 mg)  Leche de soja, 1 taza (300 mg)  [place], 1/2 taza (420 mg)  [place]spinaca, enlatada, 1/2 taza (370 mg)  Batata, asada, con piel, 1 mediana (450 mg)  Acelga suiza, 1/2 taza (480 mg)  Jugo de tomate o verduras, 1/2 taza (275 mg)  Salsa o pur de tomates, 1/2 taza (400 a 550 mg)  Tomate, crudo, 1 mediano (290 mg)  Tomates, enlatados, 1/2 taza (200 a 300 mg)  Pavo, 3 onzas/85 g (250 mg)  Germen de trigo, 1 onza/28 g (250 mg)  [place] de invierno, 1/2 taza (250 mg)  Yogur, comn o con frutas, 6 onzas/177 ml (260 a 435 mg)  [place], 1/2 taza (220 mg) CON CONTENIDO MODERADO DE POTASIO Los siguientes alimentos y bebidas tienen entre 50 y 200 mg de potasio por porcin:  Manzana, 1 (150 mg)  Jugo de [place], 1/2 taza (150 mg)  Pur de Archivist, 1/2 taza (90 mg)  Nctar de damasco, 1/2 taza (140 mg)  Esprragos, pequeos, 1/2 taza o 6 esprragos (155 mg)  Bagel con canela y pasas de uva, 1 (130 mg)  Bagel con huevo o comn, 4 pulgada/10 cm, 1 (70 mg)  Frijoles verdes, 1/2 taza (90 mg)  Frijoles amarillos, 1/2 taza (190 mg)  Cerveza, regular, 12 onzas/355 ml (100 mg)  Remolachas, enlatadas, 1/2  taza (125 mg)  Moras, 1/2 taza (115 mg)  Arndanos, 1/2 taza (60 mg)  Pan integral, 1 rebanada (70 mg)  Brcoli, crudo, 1/2 taza (145 mg)  Repollo, 1/2 taza (150 mg)  Zanahorias, crudas o cocidas, 1/2 taza (180 mg)  Coliflor, cruda, 1/2 taza (150 mg)  Apio, crudo, 1/2 taza (155 mg)  Cereales, copos de salvado, 1/2 taza (120 a 150 mg)  Requesn, 1/2 taza (110 mg)  Cerezas,  10 (150 mg)  Chocolate, barra de 1 1/2 onza/43 g (165 mg)  Caf, de cafetera, 6 onzas/177 ml (90 mg)  Maz, 1/2 taza o 1 espiga (195 mg)  Pepinos, 1/2 taza (80 mg)  Huevo, grande, 1 (60 mg)  Berenjena, 1/2 taza (60 mg)  Endivia, cruda, 1/2 taza (80 mg)  Muffin ingls, 1 (65 mg)  Pescado: reloj anaranjado, 3 onzas/85 g (150 mg)  Salchicha de [place], de vaca o cerdo, 1 (75 mg)  Cctel de frutas, 1/2 taza (115 mg)  Jugo de uvas, 1/2 taza (170 mg)  Pomelo, 1/2 (175 mg)  Uvas, 1/2 taza (155 mg)  Hojas verdes: col rizada, nabo, col, 1/2 taza (110 a 150 mg)  Helado o yogur helado, de chocolate, 1/2 taza (175 mg)  Helado o yogur helado, de vainilla, 1/2 taza (120 a 150 mg)  Limones, limas, 1 (80 mg)  Lechuga, todos los tipos, 1 taza (100 mg)  [place], 1/2 taza (150 mg)  Championes, crudos, 1/2 taza (110 mg)  Frutos secos: nueces, pacanas o macadamia, 1 onza/28 g (145m)  Avena, 1/2 taza (80 mg)  [place], 1/2 taza (110 mg)  [place]olla, cruda, 1/2 taza (120 mg)  Durazno, 1 (185 mg)  Duraznos, enlatados, 1/2 taza (120 mg)  Peras, enlatadas, 1/2 taza (120 mg)  Guisantes, congelados, 1/2 taza (90 mg)  Pimientos, verdes, 1/2 taza (130 mg)  Pimientos, rojos, 1/2 taza (160 mg)  Jugo de pia, 1/2 taza (165 mg)  Pia, fresca o enlatada, 1/2 taza (100 mg)  Ciruelas, 1 (105 mg)  Pudin de vainilla, 1/2 taza (150 mg)  Frambuesas, 1/2 taza (90 mg)  Ruibardo, 1/2 taza (115 mg)  Arroz salvaje, 1/2 taza (80 mg)  Camarones, 3 onzas/85 g (155 mg)  [place]spin[place],  cruda, 1 taza (170 mg)  Fresas, 1/2 taza (125 mg)  [place] de verano, 1/2 taza (175 a 200 mg)  Acelga suiza, cruda, 1 taza (135 mg)  Mandarina, 1 (140 mg)  T, 6 onzas/177 ml (65 mg)  Nabos, 1/2 taza (140 mg)  Sanda, 1/2 taza (85 mg)  Vino tinto, de mesa, 5 onzas/148 ml (180 mg)  Vino blanco, de mesa, 5 onzas/148 ml (100 mg) CON BAJO CONTENIDO DE POTASIO Los siguientes alimentos y bebidas tienen menos de 50 mg de potasio por porcin:  Pan blanco, 1 rebanada (30 g)  Bebidas gaseosas, 12 onzas/355 ml (menos de 5 mg)  Queso, 1 onza/28 g (20 a 30 mg)  Arndanos rojos, 1/2 taza (45 mg)  Cctel con jugo de arndanos rojos, 1/2 taza (20 mg)  Grasas y aceites, 1 cucharada (menos de 5 mg)  Hummus, 1 cucharada (32 mg)  Nctar: papaya, mango o pera, 1/2 taza (35 mg)  Arroz blanco o integral, 1/2 taza (50 mg)  Espaguetis o macarrones, cocidos, 1/2 taza (30 mg)  Tortilla de harina o maz, 1 (50 mg)  Waffle, 4 pulgadas/10 cm, 1 (50 mg)  Castaas de agua, 1/2 taza (40 mg) Esta informacin no tiene cMarine scientistel consejo del mdico. Asegrese de hacerle al mdico cualquier pregunta que tenga. Document Released: 02/28/2012 Document Revised: 06/10/2013 Document Reviewed: 05/02/2013 Elsevier Interactive Patient Education  2017 [employer]

## 2016-10-10 NOTE — Progress Notes (Signed)
Pt is having lower pelvic pain and pressure. Pt also having left leg pain.

## 2016-10-10 NOTE — Assessment & Plan Note (Signed)
A: exam consistent with MSK pain, normal imaging except for diverticulosis on CT P: Depo medrol taper Robaxin Potassium to replace low K+ Close follow up Urine culture

## 2016-10-10 NOTE — Progress Notes (Signed)
Subjective:  Patient ID: Sheri Mann, female    DOB: 01-06-1966  Age: 51 y.o. MRN: [id number]  CC: Pelvic Pain and Leg Pain   HPI Sheri Mann has history of low back pain, diverticulosis she presents for  [employer] Spanish interpreter [place] ID # [id number]   5. Back pain: x one month. Pain in mid and lower back. Left hip and pelvic area. Pain is mild currently. Pain is cramping with walking. Also sharp pain with lying down. She has a lot of pain in mid lower abdomen when walking. No fever or chills. She does have hot flashes with sudden sensation of heat in body and increase thirst. She has dysuria and urine urgency over the last month. She diarrhea in 09/18/2016 and went to ED. Diarrhea has resolved. Pain remains unchanges. She denies blood in stool. Reports mild constipation.   Level of pain in back is 5/10. Level of pain in pelvic and Left hip is 7/10.   CT abdomen and pelvic 09/19/2016: IMPRESSION: Mild uncomplicated colonic diverticulosis. No focal inflammation. No acute findings.  Labs were normal except for potassium of 3.3     Social History  Substance Use Topics  . Smoking status: Never Smoker  . Smokeless tobacco: Never Used  . Alcohol use No    Outpatient Medications Prior to Visit  Medication Sig Dispense Refill  . ibuprofen (ADVIL,MOTRIN) 600 MG tablet Take 1 tablet (600 mg total) by mouth every 8 (eight) hours as needed. (Patient not taking: Reported on 10/10/2016) 30 tablet 0  . methocarbamol (ROBAXIN) 500 MG tablet Take 1 tablet (500 mg total) by mouth 4 (four) times daily. X 7 days then prn muscle spasm (Patient not taking: Reported on 10/10/2016) 90 tablet 1  . naproxen (NAPROSYN) 500 MG tablet Take 1 tablet (500 mg total) by mouth 2 (two) times daily with a meal. X 7 days then prn pain (Patient not taking: Reported on 10/10/2016) 60 tablet 0  . omeprazole (PRILOSEC) 20 MG capsule Take 1 capsule (20 mg total) by mouth daily. (Patient not taking: Reported on  10/10/2016) 30 capsule 3  . ondansetron (ZOFRAN) 4 MG tablet Take 1 tablet (4 mg total) by mouth every 8 (eight) hours as needed for nausea or vomiting. (Patient not taking: Reported on 10/10/2016) 20 tablet 0  . ranitidine (ZANTAC) 150 MG tablet Take 1 tablet (150 mg total) by mouth 2 (two) times daily. (Patient not taking: Reported on 10/10/2016) 60 tablet 0  . sucralfate (CARAFATE) 1 g tablet Take 1 tablet (1 g total) by mouth 4 (four) times daily -  with meals and at bedtime. (Patient not taking: Reported on 10/10/2016) 40 tablet 0   No facility-administered medications prior to visit.     ROS Review of Systems  Constitutional: Negative for chills and fever.  Eyes: Negative for visual disturbance.  Respiratory: Negative for shortness of breath.   Cardiovascular: Negative for chest pain.  Gastrointestinal: Positive for abdominal pain. Negative for blood in stool.  Musculoskeletal: Positive for arthralgias, back pain and myalgias. Negative for gait problem, joint swelling, neck pain and neck stiffness.  Skin: Negative for rash.  Allergic/Immunologic: Negative for immunocompromised state.  Hematological: Negative for adenopathy. Does not bruise/bleed easily.  Psychiatric/Behavioral: Negative for dysphoric mood and suicidal ideas.    Objective:  BP 124/85   Pulse 71   Temp 98.3 F (36.8 C) (Oral)   Ht 5' (1.524 m)   Wt 162 lb 3.2 oz (73.6 kg)   LMP 02/10/2014  SpO2 98%   BMI 31.68 kg/m   BP/Weight 10/10/2016 09/19/2016 6/60/6301  Systolic BP 601 093 235  Diastolic BP 85 79 75  Wt. (Lbs) 162.2 - 169.4  BMI 31.68 - 33.08    Physical Exam  Constitutional: She is oriented to person, place, and time. She appears well-developed and well-nourished. No distress.  HENT:  Head: Normocephalic and atraumatic.  Cardiovascular: Normal rate, regular rhythm, normal heart sounds and intact distal pulses.   Pulmonary/Chest: Effort normal and breath sounds normal.  Genitourinary: Rectal exam  shows no external hemorrhoid, no internal hemorrhoid, no fissure, no mass, no tenderness, anal tone normal and guaiac negative stool.  Musculoskeletal: She exhibits no edema.       Left hip: She exhibits tenderness (left lateral hip).  Back Exam: Back: Normal Curvature, no deformities or CVA tenderness  Paraspinal Tenderness: left lumabr   LE Strength 5/5  LE Sensation: in tact  LE Reflexes 2+ and symmetric  Straight leg raise: negative    Neurological: She is alert and oriented to person, place, and time.  Skin: Skin is warm and dry. No rash noted.  Psychiatric: She has a normal mood and affect.    UA: trace intact blood, otherwise normal  Assessment & Plan:  Sheri Mann was seen today for pelvic pain and leg pain.  Diagnoses and all orders for this visit:  Pelvic pain -     POCT urinalysis dipstick -     POCT occult blood stool -     Cancel: CMP14+EGFR -     Cancel: CBC -     Cancel: Magnesium -     Urine culture  Low back pain radiating to left leg -     methylPREDNISolone (MEDROL DOSEPAK) 4 MG TBPK tablet; Taper per packet insert -     Discontinue: methocarbamol (ROBAXIN) 500 MG tablet; Take 1 tablet (500 mg total) by mouth at bedtime. -     potassium chloride SA (K-DUR,KLOR-CON) 20 MEQ tablet; Take 1 tablet (20 mEq total) by mouth daily. -     methocarbamol (ROBAXIN) 500 MG tablet; Take 1 tablet (500 mg total) by mouth at bedtime.   There are no diagnoses linked to this encounter.  No orders of the defined types were placed in this encounter.   Follow-up: Return in about 2 weeks (around 10/24/2016) for back and L hip and pelvis pain .   Boykin Nearing MD

## 2016-10-12 LAB — URINE CULTURE: ORGANISM ID, BACTERIA: NO GROWTH

## 2016-10-20 ENCOUNTER — Encounter: Payer: Self-pay | Admitting: Family Medicine

## 2016-10-20 ENCOUNTER — Ambulatory Visit: Payer: Self-pay | Attending: Family Medicine | Admitting: Family Medicine

## 2016-10-20 VITALS — BP 110/77 | HR 73 | Temp 98.4°F | Ht 60.0 in | Wt 165.2 lb

## 2016-10-20 DIAGNOSIS — K219 Gastro-esophageal reflux disease without esophagitis: Secondary | ICD-10-CM

## 2016-10-20 DIAGNOSIS — K5730 Diverticulosis of large intestine without perforation or abscess without bleeding: Secondary | ICD-10-CM | POA: Insufficient documentation

## 2016-10-20 DIAGNOSIS — E876 Hypokalemia: Secondary | ICD-10-CM | POA: Insufficient documentation

## 2016-10-20 DIAGNOSIS — M545 Low back pain, unspecified: Secondary | ICD-10-CM

## 2016-10-20 DIAGNOSIS — M79605 Pain in left leg: Secondary | ICD-10-CM

## 2016-10-20 MED ORDER — METHOCARBAMOL 500 MG PO TABS: 500.0000 mg | ORAL_TABLET | Freq: Every day | ORAL | 0 refills | Status: DC

## 2016-10-20 MED ORDER — RANITIDINE HCL 150 MG PO TABS: 150.0000 mg | ORAL_TABLET | Freq: Two times a day (BID) | ORAL | 2 refills | Status: DC

## 2016-10-20 NOTE — Patient Instructions (Addendum)
Sheri Mann was seen today for follow-up.  Diagnoses and all orders for this visit:  Gastroesophageal reflux disease, esophagitis presence not specified -     ranitidine (ZANTAC) 150 MG tablet; Take 1 tablet (150 mg total) by mouth 2 (two) times daily.  Hypokalemia -     Basic metabolic panel  Low back pain radiating to left leg -     methocarbamol (ROBAXIN) 500 MG tablet; Take 1 tablet (500 mg total) by mouth at bedtime.    f/u in 8 weeks for gastritis   Opciones de alimentos para pacientes con reflujo gastroesofgico - Adultos (Food Choices for Gastroesophageal Reflux Disease, Adult) Cuando se tiene reflujo gastroesofgico (ERGE), los alimentos que se ingieren y los hbitos de alimentacin son muy importantes. Elegir los alimentos adecuados puede ayudar a Altria Groupaliviar las molestias. QU PAUTAS DEBO SEGUIR?  Elija las frutas, los vegetales, los cereales integrales y los productos lcteos con bajo contenido de [place].  Elija las carnes de [place], de pescado y de ave con bajo contenido de grasas.  Limite las grasas, [address] Lanecomo los Lake Loreleiaceites, los aderezos para [place] Cityensalada, la Grayhawkmanteca, los frutos secos y Programme researcher, broadcasting/film/videoel aguacate.  Lleve un registro de alimentos. Esto ayuda a identificar los alimentos que ocasionan sntomas.  Evite los alimentos que le ocasionen sntomas. Pueden ser distintos para cada persona.  Haga comidas pequeas durante Glass blower/designerel da en lugar de 3 comidas abundantes.  Coma lentamente, en un lugar donde est distendido.  Limite el consumo de alimentos fritos.  Cocine los alimentos utilizando mtodos que no sean la fritura.  Evite el consumo alcohol.  Evite beber grandes cantidades de lquidos con las comidas.  Evite agacharse o recostarse hasta despus de 2 o 3horas de haber comido. QU ALIMENTOS NO SE RECOMIENDAN? Estos son algunos alimentos y bebidas que pueden empeorar los sntomas: Veterinary surgeonVegetales Tomates. Jugo de tomate. Salsa de tomate y espagueti. Ajes. [place] y [place] Lakesajo. Rbano  picante. Frutas Naranjas, pomelos y limn (fruta y Sloveniajugo). Carnes Carnes de [place]vaca, de pescado y de ave con gran contenido de grasas. Esto incluye los perros calientes, las Southern Utecostillas, el Wilburnjamn, la salchicha, el salame y el tocino. Lcteos Leche entera y [place] chocolatada. PPG IndustriesCrema cida. Crema. Mantequilla. Helados. Queso crema. Bebidas T o caf. Bebidas gaseosas o bebidas energizantes. Condimentos Salsa picante. Salsa barbacoa. Dulces/postres Chocolate y cacao. Rosquillas. Menta y mentol. Grasas y Du Pontaceites Alimentos muy grasos. Esto incluye las papas fritas. Otros Vinagre. Especias picantes. Esto incluye la pimienta negra, la pimienta blanca, la pimienta roja, la pimienta de cayena, el curry en polvo, los clavos de [place] Townolor, el jengibre y el [place] en polvo. Esta no es Raytheonuna lista completa de los alimentos y las bebidas que se Theatre stage managerdeben evitar. Comunquese con el nutricionista para recibir ms informacin. Esta informacin no tiene Theme park managercomo fin reemplazar el consejo del mdico. Asegrese de hacerle al mdico cualquier pregunta que tenga. Document Released: 12/05/2011 Document Revised: 06/26/2014 Document Reviewed: 04/09/2013 Elsevier Interactive Patient Education  2017 [employer]vier [employer]

## 2016-10-20 NOTE — Assessment & Plan Note (Signed)
Improved Continue robaxin nightly prn

## 2016-10-20 NOTE — Assessment & Plan Note (Signed)
Start zantac GERD diet ordered

## 2016-10-20 NOTE — Progress Notes (Signed)
Subjective:  Patient ID: Sheri Mann, female    DOB: 26-Apr-1966  Age: 51 y.o. MRN: [id number]  CC: Follow-up   HPI Lilyanah Felipa Furnace has history of low back pain, diverticulosis she presents for  [employer] Spanish interpreter [place]  ID # [id number]   1. Back pain: x one month. Pain in mid and lower back. Left hip and pelvic area. Pain is mild currently. Pain is cramping with walking. Also sharp pain with lying down. She has a lot of pain in mid lower abdomen when walking. No fever or chills. She does have hot flashes with sudden sensation of heat in body and increase thirst. She has dysuria and urine urgency over the last month. She diarrhea in 09/18/2016 and went to ED. Diarrhea has resolved. Pain has improved with steroid taper and robaxin. She denies blood in stool. Reports mild constipation.   Level of pain in back is 5/10. Level of pain in pelvic and Left hip is 5/10.   CT abdomen and pelvic 09/19/2016: IMPRESSION: Mild uncomplicated colonic diverticulosis. No focal inflammation. No acute findings.  Labs were normal except for potassium of 3.3   She reports burning sensation in epigastric area.   Social History  Substance Use Topics  . Smoking status: Never Smoker  . Smokeless tobacco: Never Used  . Alcohol use No    Outpatient Medications Prior to Visit  Medication Sig Dispense Refill  . methocarbamol (ROBAXIN) 500 MG tablet Take 1 tablet (500 mg total) by mouth at bedtime. (Patient not taking: Reported on 10/20/2016) 30 tablet 0  . methylPREDNISolone (MEDROL DOSEPAK) 4 MG TBPK tablet Taper per packet insert (Patient not taking: Reported on 10/20/2016) 21 tablet 0  . potassium chloride SA (K-DUR,KLOR-CON) 20 MEQ tablet Take 1 tablet (20 mEq total) by mouth daily. (Patient not taking: Reported on 10/20/2016) 7 tablet 0   No facility-administered medications prior to visit.     ROS Review of Systems  Constitutional: Negative for chills and fever.  Eyes: Negative for visual  disturbance.  Respiratory: Negative for shortness of breath.   Cardiovascular: Negative for chest pain.  Gastrointestinal: Positive for abdominal pain. Negative for blood in stool.  Musculoskeletal: Positive for arthralgias, back pain and myalgias. Negative for gait problem, joint swelling, neck pain and neck stiffness.  Skin: Negative for rash.  Allergic/Immunologic: Negative for immunocompromised state.  Hematological: Negative for adenopathy. Does not bruise/bleed easily.  Psychiatric/Behavioral: Negative for dysphoric mood and suicidal ideas.    Objective:  BP 110/77   Pulse 73   Temp 98.4 F (36.9 C) (Oral)   Ht 5' (1.524 m)   Wt 165 lb 3.2 oz (74.9 kg)   LMP 02/10/2014   SpO2 99%   BMI 32.26 kg/m   BP/Weight 10/20/2016 10/10/2016 09/19/2016  Systolic BP 110 124 114  Diastolic BP 77 85 79  Wt. (Lbs) 165.2 162.2 -  BMI 32.26 31.68 -    Physical Exam  Constitutional: She is oriented to person, place, and time. She appears well-developed and well-nourished. No distress.  HENT:  Head: Normocephalic and atraumatic.  Cardiovascular: Normal rate, regular rhythm, normal heart sounds and intact distal pulses.   Pulmonary/Chest: Effort normal and breath sounds normal.  Genitourinary: Rectal exam shows no external hemorrhoid, no internal hemorrhoid, no fissure, no mass, no tenderness, anal tone normal and guaiac negative stool.  Musculoskeletal: She exhibits no edema.       Left hip: She exhibits tenderness (left lateral hip).     Neurological: She is alert and  oriented to person, place, and time.  Skin: Skin is warm and dry. No rash noted.  Psychiatric: She has a normal mood and affect.     Assessment & Plan:  Jonathon Residesntonieta was seen today for follow-up.  Diagnoses and all orders for this visit:  Gastroesophageal reflux disease, esophagitis presence not specified -     ranitidine (ZANTAC) 150 MG tablet; Take 1 tablet (150 mg total) by mouth 2 (two) times daily.  Hypokalemia -      Basic metabolic panel  Low back pain radiating to left leg -     methocarbamol (ROBAXIN) 500 MG tablet; Take 1 tablet (500 mg total) by mouth at bedtime.   There are no diagnoses linked to this encounter.  No orders of the defined types were placed in this encounter.   Follow-up: Return in about 8 weeks (around 12/15/2016) for gastritis.   Dessa PhiJosalyn Kendrick Remigio MD

## 2016-10-20 NOTE — Assessment & Plan Note (Signed)
Patient reports taking supplement BMP ordered to recheck level

## 2016-10-21 LAB — BASIC METABOLIC PANEL
BUN / CREAT RATIO: 15 (ref 9–23)
BUN: 10 mg/dL (ref 6–24)
CO2: 24 mmol/L (ref 18–29)
Calcium: 9.1 mg/dL (ref 8.7–10.2)
Chloride: 103 mmol/L (ref 96–106)
Creatinine, Ser: 0.67 mg/dL (ref 0.57–1.00)
GFR calc Af Amer: 119 mL/min/{1.73_m2} (ref >59)
GFR calc non Af Amer: 104 mL/min/{1.73_m2} (ref >59)
Glucose: 78 mg/dL (ref 65–99)
Potassium: 4.7 mmol/L (ref 3.5–5.2)
SODIUM: 139 mmol/L (ref 134–144)

## 2016-10-22 ENCOUNTER — Encounter (HOSPITAL_COMMUNITY): Payer: Self-pay

## 2016-10-22 ENCOUNTER — Emergency Department (HOSPITAL_COMMUNITY): Payer: Self-pay

## 2016-10-22 ENCOUNTER — Emergency Department (HOSPITAL_COMMUNITY)
Admission: EM | Admit: 2016-10-22 | Discharge: 2016-10-22 | Disposition: A | Discharge status: Home / Self Care | Payer: Self-pay | Attending: Emergency Medicine | Admitting: Emergency Medicine

## 2016-10-22 DIAGNOSIS — Z79899 Other long term (current) drug therapy: Secondary | ICD-10-CM | POA: Insufficient documentation

## 2016-10-22 DIAGNOSIS — N939 Abnormal uterine and vaginal bleeding, unspecified: Secondary | ICD-10-CM | POA: Insufficient documentation

## 2016-10-22 DIAGNOSIS — I10 Essential (primary) hypertension: Secondary | ICD-10-CM | POA: Insufficient documentation

## 2016-10-22 LAB — URINALYSIS, ROUTINE W REFLEX MICROSCOPIC
Bilirubin Urine: NEGATIVE
Glucose, UA: NEGATIVE mg/dL
Ketones, ur: NEGATIVE mg/dL
Leukocytes, UA: NEGATIVE
Nitrite: NEGATIVE
Protein, ur: NEGATIVE mg/dL
Specific Gravity, Urine: 1.006 (ref 1.005–1.030)
pH: 8 (ref 5.0–8.0)

## 2016-10-22 LAB — CBC WITH DIFFERENTIAL/PLATELET
Basophils Absolute: 0 10*3/uL (ref 0.0–0.1)
Basophils Relative: 0 %
Eosinophils Absolute: 0.1 10*3/uL (ref 0.0–0.7)
Eosinophils Relative: 1 %
HCT: 38.7 % (ref 36.0–46.0)
Hemoglobin: 13 g/dL (ref 12.0–15.0)
Lymphocytes Relative: 32 %
Lymphs Abs: 1.7 10*3/uL (ref 0.7–4.0)
MCH: 29.6 pg (ref 26.0–34.0)
MCHC: 33.6 g/dL (ref 30.0–36.0)
MCV: 88.2 fL (ref 78.0–100.0)
Monocytes Absolute: 0.3 10*3/uL (ref 0.1–1.0)
Monocytes Relative: 6 %
Neutro Abs: 3.2 10*3/uL (ref 1.7–7.7)
Neutrophils Relative %: 61 %
Platelets: 176 10*3/uL (ref 150–400)
RBC: 4.39 MIL/uL (ref 3.87–5.11)
RDW: 13.2 % (ref 11.5–15.5)
WBC: 5.3 10*3/uL (ref 4.0–10.5)

## 2016-10-22 LAB — COMPREHENSIVE METABOLIC PANEL
ALT: 32 U/L (ref 14–54)
AST: 27 U/L (ref 15–41)
Albumin: 3.8 g/dL (ref 3.5–5.0)
Alkaline Phosphatase: 114 U/L (ref 38–126)
Anion gap: 7 (ref 5–15)
BUN: 7 mg/dL (ref 6–20)
CO2: 24 mmol/L (ref 22–32)
Calcium: 9.2 mg/dL (ref 8.9–10.3)
Chloride: 109 mmol/L (ref 101–111)
Creatinine, Ser: 0.68 mg/dL (ref 0.44–1.00)
GFR calc Af Amer: >60 mL/min (ref >60)
GFR calc non Af Amer: >60 mL/min (ref >60)
Glucose, Bld: 107 mg/dL — ABNORMAL HIGH (ref 65–99)
Potassium: 3.8 mmol/L (ref 3.5–5.1)
Sodium: 140 mmol/L (ref 135–145)
Total Bilirubin: 0.9 mg/dL (ref 0.3–1.2)
Total Protein: 7.4 g/dL (ref 6.5–8.1)

## 2016-10-22 LAB — WET PREP, GENITAL
Clue Cells Wet Prep HPF POC: NONE SEEN
Sperm: NONE SEEN
Trich, Wet Prep: NONE SEEN
Yeast Wet Prep HPF POC: NONE SEEN

## 2016-10-22 LAB — LIPASE, BLOOD: Lipase: 21 U/L (ref 11–51)

## 2016-10-22 NOTE — Discharge Instructions (Signed)
Schedule and appointment for re-evaluation by the women's outpatient clinic sometime this week. They will help set up a sampling of your endometrium for testing. Return to the ED if any concerning symptoms develop.

## 2016-10-22 NOTE — ED Notes (Signed)
Patient Alert and oriented X4. Stable and ambulatory. Patient verbalized understanding of the discharge instructions.  Patient belongings were taken by the patient.  

## 2016-10-22 NOTE — ED Notes (Signed)
ED provider a the bedside

## 2016-10-22 NOTE — ED Notes (Signed)
Patient returned from Ultrasound.  No distress noted. Waiting on Ultrasound results

## 2016-10-22 NOTE — ED Triage Notes (Signed)
Patient reports that she developed lower pelvic pain last night with vaginal bleeding. Reports that she has been in menopause x 3 years. No vomiting, no nausea, NAD

## 2016-10-22 NOTE — ED Provider Notes (Signed)
[facility]-EMER[facility]ENCY DEPT Provider Note   CSN: [id number] Arrival date & time: 10/22/16  1005     History   Chief Complaint Chief Complaint  Patient presents with  . Vaginal Bleeding    HPI Sheri Mann is a 51 y.o. female who presents today with acute onset intermittent lower abdominal pain with associated vaginal bleeding since this morning. She is accompanied by her daughter who served as Nurse, learning disability. Patient's daughter states she woke up this morning with 8/10 burning lower abdominal pain which she states patient feels every 5 minutes or so before alleviating somewhat. She states she went to the bathroom this morning and noted bright red blood in the commode and on her underwear. Pain at times radiates to the low back and bilateral legs, and is made worse with walking and changing positions. Standing straight up helps. Patient also states on arrival here she felt a little bit lightheaded and nauseous. Denies vomiting, melena, hematochezia, diarrhea, constipation. She states her last menstrual period was 4 years ago. She has not tried any medications for her pain.  She states she has had 4 weeks of intermittent left hip pain and lower abdominal pain for which her primary care has begun evaluating. She states this pain feels very similar to her hip pain.   Denies fevers, chills, LOC, CP, SOB, hx of kidney stones, and patient is not a smoker and has never been a smoker.    The history is provided by the patient and a relative (daughter). No language interpreter was used.    Past Medical History:  Diagnosis Date  . Elevated BP 2013   . High cholesterol 2013   . Hypertension     Patient Active Problem List   Diagnosis Date Noted  . Gastroesophageal reflux disease 10/20/2016  . Hypokalemia 10/20/2016  . Abdominal pain, epigastric 08/05/2015  . Perimenopausal vasomotor symptoms 01/28/2015  . Positive D dimer 08/24/2014  . Left medial knee pain 08/20/2014  . Low back pain radiating  to left leg 04/10/2014  . Allergic rhinitis 04/10/2014    Past Surgical History:  Procedure Laterality Date  . BREAST LUMPECTOMY    . EYE SURGERY  2012  . TUBAL LIGATION  2000    OB History    Gravida Para Term Preterm AB Living   5 5 5     5    SAB TAB Ectopic Multiple Live Births           5       Home Medications    Prior to Admission medications   Medication Sig Start Date End Date Taking? Authorizing Provider  methocarbamol (ROBAXIN) 500 MG tablet Take 1 tablet (500 mg total) by mouth at bedtime. 10/20/16  Yes Funches, Josalyn, MD  ranitidine (ZANTAC) 150 MG tablet Take 1 tablet (150 mg total) by mouth 2 (two) times daily. 10/20/16  Yes Dessa Phi, MD    Family History Family History  Problem Relation Age of Onset  . Cancer Neg Hx   . Heart disease Neg Hx   . Diabetes Neg Hx   . Hypertension Neg Hx     Social History Social History  Substance Use Topics  . Smoking status: Never Smoker  . Smokeless tobacco: Never Used  . Alcohol use No     Allergies   Patient has no known allergies.   Review of Systems Review of Systems  Constitutional: Negative for chills and fever.  Respiratory: Negative for shortness of breath.   Cardiovascular: Negative for chest pain.  Gastrointestinal: Positive for abdominal pain and nausea. Negative for blood in stool, constipation, diarrhea and vomiting.  Genitourinary: Positive for vaginal bleeding. Negative for dysuria, hematuria, vaginal discharge and vaginal pain.  Musculoskeletal: Positive for arthralgias and myalgias. Negative for neck pain.  Neurological: Negative for syncope and headaches.     Physical Exam Updated Vital Signs BP 122/86   Pulse 63   Temp 98.1 F (36.7 C) (Oral)   Resp 19   LMP 02/10/2014   SpO2 99%   Physical Exam  Constitutional: She is oriented to person, place, and time. She appears well-developed and well-nourished. No distress.  HENT:  Head: Normocephalic and atraumatic.  Eyes:  Right eye exhibits no discharge. Left eye exhibits no discharge. No scleral icterus.  Neck: Normal range of motion. Neck supple. No JVD present. No tracheal deviation present.  Cardiovascular: Normal rate, regular rhythm, normal heart sounds and intact distal pulses.   2+ radial and DP/PT pulses bl, negative Homan's bl   Pulmonary/Chest: Effort normal and breath sounds normal. No respiratory distress. She exhibits no tenderness.  Abdominal: Soft. Bowel sounds are normal. She exhibits no distension and no mass. There is tenderness. There is guarding. There is no rebound.  Generally tender to palpation, maximally in the left lower quadrant and suprapubic region. Negative Murphy's, negative Rovsing's, no tenderness to palpation at McBurney's point.   Genitourinary:  Genitourinary Comments: No CVA tenderness. Examination performed in the presence of a chaperone. Copious blood in the vaginal vault. Cervical os is multi parous in appearance. Vaginal wall pale pink. No external genital lesions. No masses noted. There is cervical motion tenderness, and bilateral adnexal tenderness left greater than right. There is suprapubic tenderness to palpation.   Musculoskeletal: Normal range of motion. She exhibits tenderness. She exhibits no edema.  No midline spine tenderness to palpation. Left lower back somewhat tender to palpation. Full range of motion of bilateral hips. 5/5 strength of bilateral lower extremities  Neurological: She is alert and oriented to person, place, and time. No cranial nerve deficit or sensory deficit.  Fluent speech, no facial droop, sensation intact globally  Skin: Skin is warm and dry. Capillary refill takes less than 2 seconds. She is not diaphoretic.  Psychiatric: She has a normal mood and affect. Her behavior is normal.     ED Treatments / Results  Labs (all labs ordered are listed, but only abnormal results are displayed) Labs Reviewed  WET PREP, GENITAL - Abnormal; Notable  for the following:       Result Value   WBC, Wet Prep HPF POC FEW (*)    All other components within normal limits  COMPREHENSIVE METABOLIC PANEL - Abnormal; Notable for the following:    Glucose, Bld 107 (*)    All other components within normal limits  URINALYSIS, ROUTINE W REFLEX MICROSCOPIC - Abnormal; Notable for the following:    Color, Urine STRAW (*)    Hgb urine dipstick LARGE (*)    Bacteria, UA RARE (*)    Squamous Epithelial / LPF 0-5 (*)    All other components within normal limits  CBC WITH DIFFERENTIAL/PLATELET  LIPASE, BLOOD  POC URINE PREG, ED  GC/CHLAMYDIA PROBE AMP ([place]    EKG  EKG Interpretation None       Radiology [place] Transvaginal Non-ob  Result Date: 10/22/2016 CLINICAL DATA:  51 year old postmenopausal female with left pelvic pain and bleeding. EXAM: TRANSABDOMINAL AND TRANSVAGINAL ULTRASOUND OF PELVIS TECHNIQUE: Both transabdominal and transvaginal ultrasound examinations of  the pelvis were performed. Transabdominal technique was performed for global imaging of the pelvis including uterus, ovaries, adnexal regions, and pelvic cul-de-sac. It was necessary to proceed with endovaginal exam following the transabdominal exam to visualize the ovaries and endometrium. COMPARISON:  None FINDINGS: Uterus Measurements: 8.2 x 4.2 x 4 cm. No fibroids or other mass visualized. Endometrium Thickness: 9 mm and slightly heterogeneous. Right ovary Measurements: 2.4 x 2 x 2 cm. Normal appearance/no adnexal mass. Left ovary Measurements: 2.2 x 0.9 x 1.4 cm. Normal appearance/no adnexal mass. Other findings No free fluid or adnexal mass. IMPRESSION: Thickened and slightly heterogeneous endometrium (9 mm) in this postmenopausal patient. In the setting of post-menopausal bleeding, endometrial sampling is indicated to exclude carcinoma. If results are benign, sonohysterogram should be considered for focal lesion work-up. (Ref: Radiological Reasoning: Algorithmic  Workup of Abnormal Vaginal Bleeding with Endovaginal Sonography and Sonohysterography. AJR 2008; 409:W11-91) Normal ovaries.  No free fluid or adnexal mass. Electronically Signed   By: Harmon Pier M.D.   On: 10/22/2016 15:13   US Pelvis Complete  Result Date: 10/22/2016 CLINICAL DATA:  51 year old postmenopausal female with left pelvic pain and bleeding. EXAM: TRANSABDOMINAL AND TRANSVAGINAL ULTRASOUND OF PELVIS TECHNIQUE: Both transabdominal and transvaginal ultrasound examinations of the pelvis were performed. Transabdominal technique was performed for global imaging of the pelvis including uterus, ovaries, adnexal regions, and pelvic cul-de-sac. It was necessary to proceed with endovaginal exam following the transabdominal exam to visualize the ovaries and endometrium. COMPARISON:  None FINDINGS: Uterus Measurements: 8.2 x 4.2 x 4 cm. No fibroids or other mass visualized. Endometrium Thickness: 9 mm and slightly heterogeneous. Right ovary Measurements: 2.4 x 2 x 2 cm. Normal appearance/no adnexal mass. Left ovary Measurements: 2.2 x 0.9 x 1.4 cm. Normal appearance/no adnexal mass. Other findings No free fluid or adnexal mass. IMPRESSION: Thickened and slightly heterogeneous endometrium (9 mm) in this postmenopausal patient. In the setting of post-menopausal bleeding, endometrial sampling is indicated to exclude carcinoma. If results are benign, sonohysterogram should be considered for focal lesion work-up. (Ref: Radiological Reasoning: Algorithmic Workup of Abnormal Vaginal Bleeding with Endovaginal Sonography and Sonohysterography. AJR 2008; 478:G95-62) Normal ovaries.  No free fluid or adnexal mass. Electronically Signed   By: Harmon Pier M.D.   On: 10/22/2016 15:13    Procedures Procedures (including critical care time)  Medications Ordered in ED Medications - No data to display   Initial Impression / Assessment and Plan / ED Course  I have reviewed the triage vital signs and the nursing  notes.  Pertinent labs & imaging results that were available during my care of the patient were reviewed by me and considered in my medical decision making (see chart for details).     Patient with vaginal bleeding and abdominal pain. Afebrile, vital signs are stable. CBC, CMP, lipase all unremarkable. UA not concerning for UTI. Blood seen in the vaginal vault on examination with CMT and adnexal tenderness. Pelvic and transvaginal ultrasound shows endometrial thickening with no evidence of ectopic, TOA, ovarian torsion or cyst. Suspicion of PID is low. Endometrial thickening requires biopsy for further evaluation. Patient is stable for discharge home with outpatient management. Ibuprofen or Tylenol for pain. Given referral to OB/GYN for follow-up and management. Recommend follow-up with OB/GYN this week. Discussed strict ED return precautions. Pt verbalized understanding of and agreement with plan and is safe for discharge home at this time.  Final Clinical Impressions(s) / ED Diagnoses   Final diagnoses:  Vaginal bleeding    New Prescriptions  Discharge Medication List as of 10/22/2016  3:34 PM       Jeanie Sewer, PA-C 10/22/16 2036    Loren Racer, MD 10/30/16 1452

## 2016-10-22 NOTE — ED Notes (Signed)
Patient transported to Ultrasound 

## 2016-10-23 LAB — GC/CHLAMYDIA PROBE AMP (~~LOC~~) NOT AT ARMC
Chlamydia: NEGATIVE
Neisseria Gonorrhea: NEGATIVE

## 2016-10-24 LAB — POC URINE PREG, ED: Preg Test, Ur: NEGATIVE

## 2016-10-31 ENCOUNTER — Ambulatory Visit: Payer: Self-pay | Attending: Family Medicine | Admitting: Family Medicine

## 2016-10-31 ENCOUNTER — Encounter: Payer: Self-pay | Admitting: Family Medicine

## 2016-10-31 VITALS — BP 124/79 | HR 66 | Temp 97.9°F | Ht 60.0 in | Wt 164.4 lb

## 2016-10-31 DIAGNOSIS — R938 Abnormal findings on diagnostic imaging of other specified body structures: Secondary | ICD-10-CM | POA: Insufficient documentation

## 2016-10-31 DIAGNOSIS — N950 Postmenopausal bleeding: Secondary | ICD-10-CM

## 2016-10-31 DIAGNOSIS — R9389 Abnormal findings on diagnostic imaging of other specified body structures: Secondary | ICD-10-CM

## 2016-10-31 NOTE — Patient Instructions (Addendum)
Sheri Mann was seen today for follow-up.  Diagnoses and all orders for this visit:  Post-menopausal bleeding -     Ambulatory referral to Gynecology  Thickened endometrium -     Ambulatory referral to Gynecology   Referral to gynecology placed for endometrial biopsy Plan to take ibuprofen 600 mg with food on the morning of appointment to reduce pain and cramping.   F/u in 6-8 weeks after gynecology evaluation  Dr. Armen PickupFunches  Biopsia de endometrio (Endometrial Biopsy) La biopsia de endometrio es un procedimiento en el que se toma una [place]ra de tejido del tero. Luego la muestra de tejido se observa en el microscopio para ver si el tejido es normal o anormal. El endometrio es el revestimiento interno del tero. Este procedimiento ayuda a determinar si est en el ciclo menstrual y de que modo los niveles de hormonas afectan el revestimiento del tero. Este procedimiento tambin se [place]a para evaluar el sangrado uterino o para Arts administratordiagnosticar el cncer de endometrio, tuberculosis, plipos o enfermedades inflamatorias. INFORME A SU MDICO:  Cualquier alergia que tenga.  Todos los Chesapeake Energymedicamentos que Gallantutiliza, incluyendo vitaminas, hierbas, gotas oftlmicas, cremas y [address]medicamentos de [address]a libre.  Problemas previos que usted o los Graybar Electricmiembros de su familia hayan tenido con el uso de anestsicos.  Enfermedades de Clear Channel Communicationsla sangre.  Cirugas previas.  Padecimientos mdicos.  Posible embarazo. RIESGOS Y COMPLICACIONES Generalmente es un procedimiento seguro. Sin embargo, Tree surgeoncomo en cualquier procedimiento, pueden surgir complicaciones. Las complicaciones posibles son:  Hemorragias.  Infecciones plvicas  Lesin en la pared del tero con el instrumento utilizado para tomar la biopsia (raro). ANTES DEL PROCEDIMIENTO  Lleve un registro de sus ciclos menstruales segn las indicaciones de su mdico. Puede ser necesario que programe el procedimiento para un momento especfico del ciclo menstrual.  Tendr que  llevar un apsito sanitario para usar despus del procedimiento.  Pdale a alguna persona que la lleve a su casa despus del procedimiento si le dan un medicamento para relajarse (sedante). PROCEDIMIENTO  Le podrn administrar un medicamento para relajarse.  Deber recostarse en una camilla con los pies y las piernas elevados, como en el examen plvico.  El mdico insertar un instrumento (espculo) en la vagina para observar el cuello del tero.  El cuello del tero ser desinfectado con una solucin antisptica. Para adormecer el cuello del tero le aplicarn un medicamento (anestsico local).  Se utilizar un frceps (tenculo) para Radio producermantener el KB Home	[place]cuello firme.  Se insertar un instrumento delgado, similar a una varilla (sonda uterina) a travs del cuello del tero para Chief Strategy Officerdeterminar su longitud y la ubicacin en la que ser tomada la muestra para la biopsia.  Luego se pasa un tubo delgado y flexible (catter) a travs del cuello del tero hasta el tero. El catter se [place]utiliza para Building control surveyorrecolectar la muestra de tejido del endometrio para la biopsia.  El catter y el espculo se retirarn y la Oak Leafmuestra se enviar al laboratorio para ser examinada. DESPUS DEL PROCEDIMIENTO  Descansar en una sala de recuperacin hasta que est lista para volver a su casa.  Sentir clicos leves y tendr una pequea cantidad de sangrado vaginal durante algunos das despus del procedimiento. Esto es normal.  Asegrese de Starbucks Corporationobtener los resultados. Esta informacin no tiene Theme park managercomo fin reemplazar el consejo del mdico. Asegrese de hacerle al mdico cualquier pregunta que tenga. Document Released: 02/05/2013 Document Revised: 06/10/2013 Document Reviewed: 11/20/2012 Elsevier Interactive Patient Education  2017 Arvin[employer]Elsevier Inc.

## 2016-10-31 NOTE — Progress Notes (Signed)
Subjective:  Patient ID: Sheri Mann, female    DOB: 02-16-1966  Age: 51 y.o. MRN: [id number]  CC: Follow-up  Spanish interpreter Stratus video MonaPriscilla, LouisianaID # [id number] HPI Sheri Mann has history of low back pain she  presents for   1. ED follow up  Vaginal bleeding: she bled for 4 days. She had associated abdominal pain and nausea. Prior to this she has not had vaginal bleeding for 3 years. She does report a history post menopausal bleeding after one year of amenorrhea. This episode was painless with no associated symptoms, so she did not present to care. She currently denies abdominal pain, nausea fever, chills, weight loss. Pelvic and transvaginal ultrasound 10/22/16:  IMPRESSION: Thickened and slightly heterogeneous endometrium (9 mm) in this postmenopausal patient. In the setting of post-menopausal bleeding, endometrial sampling is indicated to exclude carcinoma. If results are benign, sonohysterogram should be considered for focal lesion work-up. (Ref: Radiological Reasoning: Algorithmic Workup of Abnormal Vaginal Bleeding with Endovaginal Sonography and Sonohysterography. AJR 2008; 914:[id number]:S68-73)  Normal ovaries.  No free fluid or adnexal mass.  Social History  Substance Use Topics  . Smoking status: Never Smoker  . Smokeless tobacco: Never Used  . Alcohol use No    Outpatient Medications Prior to Visit  Medication Sig Dispense Refill  . ranitidine (ZANTAC) 150 MG tablet Take 1 tablet (150 mg total) by mouth 2 (two) times daily. 60 tablet 2  . methocarbamol (ROBAXIN) 500 MG tablet Take 1 tablet (500 mg total) by mouth at bedtime. (Patient not taking: Reported on 10/31/2016) 30 tablet 0   No facility-administered medications prior to visit.     ROS Review of Systems  Constitutional: Negative for chills and fever.  Eyes: Negative for visual disturbance.  Respiratory: Negative for shortness of breath.   Cardiovascular: Negative for chest pain.  Gastrointestinal:  Negative for abdominal pain and blood in stool.  Musculoskeletal: Negative for arthralgias and back pain.  Skin: Negative for rash.  Allergic/Immunologic: Negative for immunocompromised state.  Hematological: Negative for adenopathy. Does not bruise/bleed easily.  Psychiatric/Behavioral: Negative for dysphoric mood and suicidal ideas.    Objective:  BP 124/79   Pulse 66   Temp 97.9 F (36.6 C) (Oral)   Ht 5' (1.524 m)   Wt 164 lb 6.4 oz (74.6 kg)   LMP 02/10/2014   SpO2 99%   BMI 32.11 kg/m   BP/Weight 10/31/2016 10/22/2016 10/20/2016  Systolic BP 124 122 110  Diastolic BP 79 86 77  Wt. (Lbs) 164.4 - 165.2  BMI 32.11 - 32.26    Physical Exam  Constitutional: She is oriented to person, place, and time. She appears well-developed and well-nourished. No distress.  HENT:  Head: Normocephalic and atraumatic.  Cardiovascular: Normal rate, regular rhythm, normal heart sounds and intact distal pulses.   Pulmonary/Chest: Effort normal and breath sounds normal.  Musculoskeletal: She exhibits no edema.  Neurological: She is alert and oriented to person, place, and time.  Skin: Skin is warm and dry. No rash noted.  Psychiatric: She has a normal mood and affect.   Lab Results  Component Value Date   WBC 5.3 10/22/2016   HGB 13.0 10/22/2016   HCT 38.7 10/22/2016   MCV 88.2 10/22/2016   PLT 176 10/22/2016     Assessment & Plan:  Jonathon Residesntonieta was seen today for follow-up.  Diagnoses and all orders for this visit:  Post-menopausal bleeding -     Ambulatory referral to Gynecology  Thickened endometrium -  Ambulatory referral to Gynecology   There are no diagnoses linked to this encounter.  No orders of the defined types were placed in this encounter.   Follow-up: No Follow-up on file.   Boykin Nearing MD

## 2016-11-01 ENCOUNTER — Encounter: Payer: Self-pay | Admitting: Family Medicine

## 2016-11-01 DIAGNOSIS — N950 Postmenopausal bleeding: Secondary | ICD-10-CM | POA: Insufficient documentation

## 2016-11-01 DIAGNOSIS — R9389 Abnormal findings on diagnostic imaging of other specified body structures: Secondary | ICD-10-CM | POA: Insufficient documentation

## 2016-11-01 NOTE — Assessment & Plan Note (Signed)
Post menopausal bleeding, resolved. No anemia. Thickened endometrium on pelvic and transvaginal ultrasound  Plan: Gynecology referral for endometrial possible with sonohysterogram if benign

## 2016-11-22 ENCOUNTER — Telehealth: Payer: Self-pay

## 2016-11-22 NOTE — Telephone Encounter (Signed)
Pt was called and informed of lab results.  [id number] 

## 2016-12-15 ENCOUNTER — Encounter: Payer: Self-pay | Admitting: Family Medicine

## 2016-12-15 ENCOUNTER — Ambulatory Visit: Payer: Self-pay | Attending: Family Medicine | Admitting: Family Medicine

## 2016-12-15 VITALS — BP 119/80 | HR 79 | Temp 98.7°F | Wt 163.4 lb

## 2016-12-15 DIAGNOSIS — M25552 Pain in left hip: Secondary | ICD-10-CM | POA: Insufficient documentation

## 2016-12-15 DIAGNOSIS — J309 Allergic rhinitis, unspecified: Secondary | ICD-10-CM

## 2016-12-15 DIAGNOSIS — R938 Abnormal findings on diagnostic imaging of other specified body structures: Secondary | ICD-10-CM | POA: Insufficient documentation

## 2016-12-15 DIAGNOSIS — M545 Low back pain, unspecified: Secondary | ICD-10-CM

## 2016-12-15 DIAGNOSIS — M79605 Pain in left leg: Secondary | ICD-10-CM

## 2016-12-15 DIAGNOSIS — R9389 Abnormal findings on diagnostic imaging of other specified body structures: Secondary | ICD-10-CM

## 2016-12-15 MED ORDER — PREDNISONE 10 MG PO TABS: ORAL_TABLET | ORAL | 0 refills | Status: DC

## 2016-12-15 NOTE — Progress Notes (Signed)
Subjective:  Patient ID: Sheri Mann, female    DOB: Oct 09, 1965  Age: 51 y.o. MRN: [id number]  CC: Follow-up  Spanish interpreter Stratus video Nettie Elm ID # [id number]  HPI Krystalyn Kubota has history of low back pain she  presents for   1. Thickened endometrium: denies vaginal bleeding. She has gyn appointment next month.  Pelvic and transvaginal ultrasound 10/22/16:  IMPRESSION: Thickened and slightly heterogeneous endometrium (9 mm) in this postmenopausal patient. In the setting of post-menopausal bleeding, endometrial sampling is indicated to exclude carcinoma. If results are benign, sonohysterogram should be considered for focal lesion work-up. (Ref: Radiological Reasoning: Algorithmic Workup of Abnormal Vaginal Bleeding with Endovaginal Sonography and Sonohysterography. AJR 2008; 914:N82-95)  Normal ovaries.  No free fluid or adnexal mass.  2. Cough: associated with phlegm. Sore throat. Itching in ears. No fever or chills. No chest pain or shortness of breath.  3. L hip pain: she reports persistent pain. This has been ongoing for the past year. Pain radiates from L low back down to posterior knee. Pain is worse at night. No recent trauma. No joint swelling, fever, chills or weight loss.   Social History  Substance Use Topics  . Smoking status: Never Smoker  . Smokeless tobacco: Never Used  . Alcohol use No    Outpatient Medications Prior to Visit  Medication Sig Dispense Refill  . ranitidine (ZANTAC) 150 MG tablet Take 1 tablet (150 mg total) by mouth 2 (two) times daily. 60 tablet 2  . methocarbamol (ROBAXIN) 500 MG tablet Take 1 tablet (500 mg total) by mouth at bedtime. (Patient not taking: Reported on 10/31/2016) 30 tablet 0   No facility-administered medications prior to visit.     ROS Review of Systems  Constitutional: Negative for chills and fever.  HENT: Positive for congestion.   Eyes: Negative for visual disturbance.  Respiratory: Positive for cough.  Negative for shortness of breath.   Cardiovascular: Negative for chest pain.  Gastrointestinal: Negative for abdominal pain and blood in stool.  Musculoskeletal: Positive for arthralgias.  Skin: Negative for rash.  Allergic/Immunologic: Negative for immunocompromised state.  Hematological: Negative for adenopathy. Does not bruise/bleed easily.  Psychiatric/Behavioral: Negative for dysphoric mood and suicidal ideas.    Objective:  BP 119/80   Pulse 79   Temp 98.7 F (37.1 C) (Oral)   Wt 163 lb 6.4 oz (74.1 kg)   LMP 02/10/2014   SpO2 98%   BMI 31.91 kg/m   BP/Weight 12/15/2016 10/31/2016 10/22/2016  Systolic BP 119 124 122  Diastolic BP 80 79 86  Wt. (Lbs) 163.4 164.4 -  BMI 31.91 32.11 -    Physical Exam  Constitutional: She is oriented to person, place, and time. She appears well-developed and well-nourished. No distress.  HENT:  Head: Normocephalic and atraumatic.  Cardiovascular: Normal rate, regular rhythm, normal heart sounds and intact distal pulses.   Pulmonary/Chest: Effort normal and breath sounds normal.  Musculoskeletal: She exhibits no edema.       Left hip: Normal.  Back Exam: Back: Normal Curvature, no deformities or CVA tenderness  Paraspinal Tenderness: L lumbar   LE Strength 5/5  LE Sensation: in tact  LE Reflexes 2+ and symmetric  Straight leg raise: negative    Neurological: She is alert and oriented to person, place, and time.  Skin: Skin is warm and dry. No rash noted.  Psychiatric: She has a normal mood and affect.   Lab Results  Component Value Date   WBC 5.3 10/22/2016   HGB  13.0 10/22/2016   HCT 38.7 10/22/2016   MCV 88.2 10/22/2016   PLT 176 10/22/2016     Assessment & Plan:  Jonathon Residesntonieta was seen today for follow-up.  Diagnoses and all orders for this visit:  Allergic rhinitis, unspecified seasonality, unspecified trigger -     predniSONE (DELTASONE) 10 MG tablet; Take by mouth 4 tablets for 2 days, 3 tablets for 2 days, 2 tablets  for 2 days, 1 tablet for 3 days then STOP  Low back pain radiating to left leg -     predniSONE (DELTASONE) 10 MG tablet; Take by mouth 4 tablets for 2 days, 3 tablets for 2 days, 2 tablets for 2 days, 1 tablet for 3 days then STOP  Thickened endometrium   There are no diagnoses linked to this encounter.  No orders of the defined types were placed in this encounter.   Follow-up: Return in about 4 weeks (around 01/12/2017) for L low back/hip pain and f/u gyn appt.   Dessa PhiJosalyn Aanya Haynes MD

## 2016-12-15 NOTE — Patient Instructions (Addendum)
Sheri Mann was seen today for follow-up.  Diagnoses and all orders for this visit:  Allergic rhinitis, unspecified seasonality, unspecified trigger -     predniSONE (DELTASONE) 10 MG tablet; Take by mouth 4 tablets for 2 days, 3 tablets for 2 days, 2 tablets for 2 days, 1 tablet for 3 days then STOP  Low back pain radiating to left leg -     predniSONE (DELTASONE) 10 MG tablet; Take by mouth 4 tablets for 2 days, 3 tablets for 2 days, 2 tablets for 2 days, 1 tablet for 3 days then STOP   Complete home exercises twice daily  F/u in  4 weeks for low back and left hip pain and f/u gyne appointment   Dr. Armen Pickup   Spondylolisthesis With Rehab Spondylolisthesis Rehab Consulte al mdico qu ejercicios son seguros para usted. Haga los ejercicios exactamente como se lo haya indicado el mdico y gradelos como se lo hayan indicado. Es normal sentir un leve estiramiento, tirn, rigidez o molestia cuando haga estos ejercicios, pero debe detenerse de inmediato si siento un dolor repentino o si el dolor empeora. No comience a hacer estos ejercicios hasta que se lo indique el mdico. EJERCICIOS DE [employer] Y AMPLITUD DE MOVIMIENTOS Estos ejercicios calientan los msculos y las articulaciones, y mejoran el movimiento y la flexibilidad de la cadera y la espalda. Estos ejercicios tambin ayudan a Engineer, materials, el adormecimiento y el hormigueo. Ejercicio A: Rodilla al pecho 1. Acustese boca arriba en una superficie firme con las piernas extendidas. 2. Flexione una rodilla. Tome la rodilla con las manos y llvela hacia el pecho hasta que sienta un estiramiento suave en la parte inferior de la espalda y las nalgas. ? Mantenga la pierna en esta posicin tomando la parte frontal de la rodilla. ? Mantenga la otra pierna lo ms extendida posible. 3. Mantenga esta posicin durante __________ segundos. 4. Vuelva lentamente a la posicin inicial. 5. Repita este ejercicio con la otra pierna.  Repita  __________ veces. Realice este ejercicio __________ veces al da. Ejercicio B: [employer] rodillas al pecho 1. Acustese boca arriba en una superficie firme con las piernas extendidas. 2. Flexione una rodilla y llvela hacia el pecho hasta que sienta un estiramiento suave en la parte inferior de la espalda y las nalgas. 3. Tensione los msculos abdominales y repita el paso anterior con la otra pierna. 4. Sostenga ambas piernas en esta posicin tomndolas desde la parte posterior de los muslos o la parte delantera de las rodillas. 5. Mantenga esta posicin durante __________ segundos. 6. Tensione los msculos abdominales y lentamente mueva vuelva a Scientific laboratory technician las piernas en el piso, una pierna por vez.  Repita __________ veces. Realice este ejercicio __________ veces al da. EJERCICIOS DE FORTALECIMIENTO Estos ejercicios fortalecen la espalda y le otorgan resistencia. La resistencia es la capacidad de usar los msculos durante un tiempo prolongado, incluso despus de que se cansen. Ejercicio C: Inclinacin de la pelvis 1. Acustese boca arriba sobre una cama firme o sobre el suelo. Flexione las rodillas y [employer]. 2. Tensione los msculos abdominales. Eleve la pelvis hacia el techo y aplane la parte inferior de la espalda contra el suelo. ? Para realizar este ejercicio, puede colocar una toalla pequea debajo de la parte inferior de la espalda y presionar la espalda contra la toalla. 3. Mantenga esta posicin durante __________ segundos. 4. Relaje totalmente los msculos antes de repetir el ejercicio.  Repita __________ veces. Realice este ejercicio __________ veces al da. Ejercicio  D: Abdominales 1. Acustese boca arriba sobre una superficie firme. Flexione las rodillas y Aflac Incorporatedmantenga los pies apoyados. Cruce los World Fuel Services Corporationbrazos sobre el pecho. 2. Coloque el mentn hacia abajo en direccin al pecho sin doblar el cuello. 3. Use los msculos abdominales para levantar la parte superior del  cuerpo y despegarla del suelo hacia arriba en el aire. ? Trate de levantarse hasta que los omplatos se despeguen del suelo. Es posible que deba ejercitarse para Teacher, English as a foreign languagelograrlo. ? Mantenga la parte inferior de la espalda en el suelo mientras se levanta apretando los msculos abdominales. ? No contenga la respiracin. 4. Lentamente vuelva a bajar. Mantenga tensos los msculos abdominales mientras vuelve a la posicin inicial.  Repita __________ veces. Realice este ejercicio __________ veces al da. Ejercicio E: Elevaciones alternadas de pierna y brazo 1. Apoye las palmas de las manos y las rodillas sobre una superficie firme. Si se colocar sobre una superficie muy dura, puede usar un elemento acolchado para apoyar las rodillas, como una alfombrilla para ejercicios. 2. Alinee los brazos y las piernas. Las manos deben estar debajo de los hombros y las rodillas debajo de la cadera. 3. Eleve la pierna izquierda hacia atrs. Al mismo tiempo, eleve el brazo derecho y Associate Professorestrelo frente a usted. ? No eleve la pierna por encima de la cadera. ? No eleve el brazo por encima del hombro. ? Mantenga los msculos del abdomen y de la espalda contrados. ? Mantenga la cadera HCA Incmirando hacia el suelo. ? No arquee la espalda. ? Mantenga el equilibrio con cuidado y no contenga la respiracin. 4. Mantenga esta posicin durante __________ segundos. 5. Lentamente regrese a la posicin inicial y repita el ejercicio con la pierna derecha y el brazo izquierdo.  Repita __________ veces. Realice este ejercicio __________ veces al da. Tyson DensePOSTURA Y MECNICA CORPORAL La Water quality scientistmecnica corporal se refiere a los movimientos y a las posiciones del cuerpo mientras realiza las actividades diarias. La postura es una parte de la Water quality scientistmecnica corporal. La buena postura y la Administrator, sportsmecnica corporal saludable pueden ayudar a Acupuncturistaliviar el estrs en las articulaciones y los tejidos del cuerpo. La buena postura significa que la columna mantiene su posicin natural de  curvatura en forma de S (la columna est en una posicin neutral), los hombros Zenaida Niecevan un poco hacia atrs y la cabeza no se inclina hacia adelante. A continuacin, se incluyen pautas generales para mejorar la postura y Regulatory affairs officerla mecnica corporal en las actividades diarias. De pie  Al estar de pie, mantenga la columna en la posicin neutral y los pies separados al ancho de caderas, aproximadamente. Mantenga las rodillas ligeramente flexionadas. Las Woodsburghorejas, los hombros y las caderas deben estar alineados.  Cuando realice una tarea en la que deba estar de pie en el mismo sitio durante mucho tiempo, coloque un pie en un objeto estable de 2 a 4 pulgadas (5 a 10 cm) de alto, como un taburete. Esto ayuda a que la columna mantenga una posicin neutral.  Sentado  Cuando est sentado, mantenga la columna en posicin neutral y deje los pies apoyados en el suelo. Use un apoyapis, si es necesario, y FedExmantenga los muslos paralelos al suelo. Evite redondear los hombros e inclinar la cabeza hacia adelante.  Cuando trabaje en un escritorio o con una computadora, el escritorio debe estar a una altura en la que las manos estn un poco ms abajo que los codos. Deslice la silla debajo del escritorio, de modo de estar lo suficientemente cerca como para mantener una buena Melrosepostura.  Cuando trabaje  con Neomia Dear computadora, coloque el monitor a Buyer, retail permita mirar derecho hacia adelante, sin tener que inclinar la cabeza hacia adelante o [place] atrs.  Reposo Al descansar o estar acostado, evite las posiciones que le causen ms dolor.  Si siente dolor al hacer actividades que exigen sentarse, inclinarse, agacharse o ponerse en cuclillas (actividades basadas en la flexin), acustese en una posicin en la que el cuerpo no deba doblarse mucho. Por ejemplo, evite acurrucarse de costado con los brazos y las rodillas cerca del pecho (posicin fetal).  Si siente dolor con las actividades que exigen estar de pie durante mucho  tiempo o Furniture conservator/restorer los brazos (actividades basadas en la extensin), acustese con la columna en una posicin neutral y flexione ligeramente las rodillas. Pruebe con las siguientes posiciones: ? Teacher, music de costado con una almohada entre las rodillas. ? Acostarse boca arriba con una almohada debajo de las rodillas.  Levantar objetos  Cuando tenga que levantar un objeto, mantenga los pies separados el ancho de los hombros y apriete los msculos abdominales.  Flexione las rodillas y la cadera, y Dietitian la columna en posicin neutral. Es importante levantar utilizando la fuerza de las piernas, no de la espalda. No trabe las rodillas hacia afuera.  Siempre pida ayuda a otra persona para levantar objetos pesados o incmodos.  Esta informacin no tiene Theme park manager el consejo del mdico. Asegrese de hacerle al mdico cualquier pregunta que tenga. Document Released: 03/22/2006 Document Revised: 10/20/2014 Document Reviewed: 03/16/2015 Elsevier Interactive Patient Education  [employer].

## 2016-12-18 NOTE — Assessment & Plan Note (Signed)
Cough and congestion Exam consistent with rhinitis Prednisone taper ordered

## 2016-12-18 NOTE — Assessment & Plan Note (Signed)
Persistent pain Plan: prednisone taper If symptoms persist will obtain lumbar spine and L hip x-ray

## 2016-12-18 NOTE — Assessment & Plan Note (Signed)
Patient no longer bleeding She has gyn appt for endometrial biopsy

## 2016-12-27 ENCOUNTER — Emergency Department (HOSPITAL_COMMUNITY)
Admission: EM | Admit: 2016-12-27 | Discharge: 2016-12-27 | Disposition: A | Discharge status: Home / Self Care | Payer: Self-pay | Attending: Emergency Medicine | Admitting: Emergency Medicine

## 2016-12-27 ENCOUNTER — Encounter (HOSPITAL_COMMUNITY): Payer: Self-pay | Admitting: Emergency Medicine

## 2016-12-27 DIAGNOSIS — N12 Tubulo-interstitial nephritis, not specified as acute or chronic: Secondary | ICD-10-CM | POA: Insufficient documentation

## 2016-12-27 DIAGNOSIS — R300 Dysuria: Secondary | ICD-10-CM | POA: Insufficient documentation

## 2016-12-27 LAB — COMPREHENSIVE METABOLIC PANEL
ALT: 36 U/L (ref 14–54)
ANION GAP: 8 (ref 5–15)
AST: 36 U/L (ref 15–41)
Albumin: 3.3 g/dL — ABNORMAL LOW (ref 3.5–5.0)
Alkaline Phosphatase: 141 U/L — ABNORMAL HIGH (ref 38–126)
BUN: 12 mg/dL (ref 6–20)
CHLORIDE: 104 mmol/L (ref 101–111)
CO2: 22 mmol/L (ref 22–32)
Calcium: 8.8 mg/dL — ABNORMAL LOW (ref 8.9–10.3)
Creatinine, Ser: 0.89 mg/dL (ref 0.44–1.00)
Glucose, Bld: 123 mg/dL — ABNORMAL HIGH (ref 65–99)
Potassium: 3.6 mmol/L (ref 3.5–5.1)
Sodium: 134 mmol/L — ABNORMAL LOW (ref 135–145)
Total Bilirubin: 0.6 mg/dL (ref 0.3–1.2)
Total Protein: 7.1 g/dL (ref 6.5–8.1)

## 2016-12-27 LAB — URINALYSIS, ROUTINE W REFLEX MICROSCOPIC
BILIRUBIN URINE: NEGATIVE
GLUCOSE, UA: NEGATIVE mg/dL
Ketones, ur: NEGATIVE mg/dL
NITRITE: NEGATIVE
PH: 6 (ref 5.0–8.0)
Protein, ur: 30 mg/dL — AB
SPECIFIC GRAVITY, URINE: 1.012 (ref 1.005–1.030)
SQUAMOUS EPITHELIAL / LPF: NONE SEEN

## 2016-12-27 LAB — CBC WITH DIFFERENTIAL/PLATELET
BASOS PCT: 0 %
Basophils Absolute: 0 10*3/uL (ref 0.0–0.1)
Eosinophils Absolute: 0 10*3/uL (ref 0.0–0.7)
Eosinophils Relative: 0 %
HEMATOCRIT: 38.8 % (ref 36.0–46.0)
HEMOGLOBIN: 13 g/dL (ref 12.0–15.0)
LYMPHS ABS: 1.2 10*3/uL (ref 0.7–4.0)
Lymphocytes Relative: 12 %
MCH: 29.4 pg (ref 26.0–34.0)
MCHC: 33.5 g/dL (ref 30.0–36.0)
MCV: 87.8 fL (ref 78.0–100.0)
MONOS PCT: 4 %
Monocytes Absolute: 0.4 10*3/uL (ref 0.1–1.0)
NEUTROS ABS: 8.5 10*3/uL — AB (ref 1.7–7.7)
NEUTROS PCT: 84 %
Platelets: 149 10*3/uL — ABNORMAL LOW (ref 150–400)
RBC: 4.42 MIL/uL (ref 3.87–5.11)
RDW: 12.4 % (ref 11.5–15.5)
WBC: 10.1 10*3/uL (ref 4.0–10.5)

## 2016-12-27 LAB — LIPASE, BLOOD: LIPASE: 18 U/L (ref 11–51)

## 2016-12-27 LAB — LACTIC ACID, PLASMA: Lactic Acid, Venous: 1.2 mmol/L (ref 0.5–1.9)

## 2016-12-27 LAB — I-STAT BETA HCG BLOOD, ED (MC, WL, AP ONLY): HCG, QUANTITATIVE: 11.8 m[IU]/mL — AB (ref <5)

## 2016-12-27 MED ORDER — MORPHINE SULFATE (PF) 4 MG/ML IV SOLN
4.0000 mg | Freq: Once | INTRAVENOUS | Status: AC
  Administered 2016-12-27: 4 mg via INTRAVENOUS
  Filled 2016-12-27: qty 1

## 2016-12-27 MED ORDER — SODIUM CHLORIDE 0.9 % IV BOLUS (SEPSIS)
1000.0000 mL | Freq: Once | INTRAVENOUS | Status: AC
  Administered 2016-12-27: 1000 mL via INTRAVENOUS

## 2016-12-27 MED ORDER — CEPHALEXIN 500 MG PO CAPS: 500.0000 mg | ORAL_CAPSULE | Freq: Three times a day (TID) | ORAL | 0 refills | Status: DC

## 2016-12-27 MED ORDER — PROMETHAZINE HCL 25 MG PO TABS: 25.0000 mg | ORAL_TABLET | Freq: Four times a day (QID) | ORAL | 0 refills | Status: DC | PRN

## 2016-12-27 MED ORDER — ACETAMINOPHEN 325 MG PO TABS
650.0000 mg | ORAL_TABLET | Freq: Once | ORAL | Status: AC
  Administered 2016-12-27: 650 mg via ORAL
  Filled 2016-12-27: qty 2

## 2016-12-27 MED ORDER — DEXTROSE 5 % IV SOLN
1.0000 g | Freq: Once | INTRAVENOUS | Status: AC
  Administered 2016-12-27: 1 g via INTRAVENOUS
  Filled 2016-12-27: qty 10

## 2016-12-27 MED ORDER — ONDANSETRON HCL 4 MG/2ML IJ SOLN
4.0000 mg | Freq: Once | INTRAMUSCULAR | Status: AC
  Administered 2016-12-27: 4 mg via INTRAVENOUS
  Filled 2016-12-27: qty 2

## 2016-12-27 MED ORDER — SODIUM CHLORIDE 0.9 % IV BOLUS (SEPSIS)
1000.0000 mL | Freq: Once | INTRAVENOUS | Status: AC
Start: 2016-12-27 — End: 2016-12-27
  Administered 2016-12-27: 1000 mL via INTRAVENOUS

## 2016-12-27 NOTE — ED Provider Notes (Signed)
[facility]-EMERGENCY DEPT Provider Note   CSN: [id number] Arrival date & time: 12/27/16  [id number]     History   Chief Complaint Chief Complaint  Patient presents with  . Generalized Body Aches    HPI Sheri Residesntonieta Felipa Mann is a 51 y.o. female.  HPI   51 year old female with hx of HTN, HLD presenting with Dysuria. History obtained through Spanish interpreter via video conference. Patient reports for the past 4 days she has had progressive worsening subjective fever, chills, myalgias, dysuria including burning urination, frequency and urgency. She is now complaining of nausea without vomiting. She rates her pain as achy throbbing, 9 out of 10, persistent throughout entire body. Nothing makes it better or worse. She has been taking Advil with minimal improvement. She has never had the symptoms before. She denies severe headache, URI symptoms, chest pain, shortness of breath, productive cough, hematuria, vaginal bleeding or vaginal discharge.   Past Medical History:  Diagnosis Date  . Elevated BP 2013   . High cholesterol 2013   . Hypertension     Patient Active Problem List   Diagnosis Date Noted  . Post-menopausal bleeding 11/01/2016  . Thickened endometrium 11/01/2016  . Gastroesophageal reflux disease 10/20/2016  . Hypokalemia 10/20/2016  . Abdominal pain, epigastric 08/05/2015  . Perimenopausal vasomotor symptoms 01/28/2015  . Positive D dimer 08/24/2014  . Left medial knee pain 08/20/2014  . Low back pain radiating to left leg 04/10/2014  . Allergic rhinitis 04/10/2014    Past Surgical History:  Procedure Laterality Date  . BREAST LUMPECTOMY    . EYE SURGERY  2012  . TUBAL LIGATION  2000    OB History    Gravida Para Term Preterm AB Living   5 5 5     5    SAB TAB Ectopic Multiple Live Births           5       Home Medications    Prior to Admission medications   Medication Sig Start Date End Date Taking? Authorizing Provider  predniSONE (DELTASONE) 10 MG tablet Take by  mouth 4 tablets for 2 days, 3 tablets for 2 days, 2 tablets for 2 days, 1 tablet for 3 days then STOP 12/15/16   Funches, Josalyn, MD  ranitidine (ZANTAC) 150 MG tablet Take 1 tablet (150 mg total) by mouth 2 (two) times daily. 10/20/16   Dessa PhiFunches, Josalyn, MD    Family History Family History  Problem Relation Age of Onset  . Cancer Neg Hx   . Heart disease Neg Hx   . Diabetes Neg Hx   . Hypertension Neg Hx     Social History Social History  Substance Use Topics  . Smoking status: Never Smoker  . Smokeless tobacco: Never Used  . Alcohol use No     Allergies   Patient has no known allergies.   Review of Systems Review of Systems  All other systems reviewed and are negative.    Physical Exam Updated Vital Signs BP (!) 146/91 (BP Location: Left Arm)   Pulse (!) 116   Temp 98.9 F (37.2 C) (Oral)   Resp 18   Ht 5' (1.524 m)   Wt 73.9 kg (163 lb)   LMP 02/10/2014   SpO2 96%   BMI 31.83 kg/m   Physical Exam  Constitutional: She appears well-developed and well-nourished.  Patient appears uncomfortable but nontoxic  HENT:  Head: Atraumatic.  Eyes: Conjunctivae are normal.  Neck: Neck supple.  Cardiovascular:  Tachycardia without murmurs  rubs or gallops  Pulmonary/Chest: Effort normal and breath sounds normal.  Abdominal: Soft. Bowel sounds are normal. She exhibits no distension. There is tenderness (Mild suprapubic tenderness without guarding or rebound tenderness. Negative Murphy sign, no pain at McBurney's point).  Genitourinary:  Genitourinary Comments: No CVA tenderness  Neurological: She is alert.  Skin: No rash noted.  Psychiatric: She has a normal mood and affect.  Nursing note and vitals reviewed.    ED Treatments / Results  Labs (all labs ordered are listed, but only abnormal results are displayed) Labs Reviewed  URINALYSIS, ROUTINE W REFLEX MICROSCOPIC - Abnormal; Notable for the following:       Result Value   APPearance CLOUDY (*)    Hgb urine  dipstick MODERATE (*)    Protein, ur 30 (*)    Leukocytes, UA LARGE (*)    Bacteria, UA FEW (*)    Non Squamous Epithelial 0-5 (*)    All other components within normal limits  COMPREHENSIVE METABOLIC PANEL - Abnormal; Notable for the following:    Sodium 134 (*)    Glucose, Bld 123 (*)    Calcium 8.8 (*)    Albumin 3.3 (*)    Alkaline Phosphatase 141 (*)    All other components within normal limits  CBC WITH DIFFERENTIAL/PLATELET - Abnormal; Notable for the following:    Platelets 149 (*)    Neutro Abs 8.5 (*)    All other components within normal limits  I-STAT BETA HCG BLOOD, ED ([facility], [facility], [facility] ONLY) - Abnormal; Notable for the following:    I-stat hCG, quantitative 11.8 (*)    All other components within normal limits  LACTIC ACID, PLASMA  LIPASE, BLOOD    EKG  EKG Interpretation None       Radiology No results found.  Procedures Procedures (including critical care time)  Medications Ordered in ED Medications  sodium chloride 0.9 % bolus 1,000 mL (0 mLs Intravenous Stopped 12/27/16 0751)  morphine 4 MG/ML injection 4 mg (4 mg Intravenous Given 12/27/16 0650)  ondansetron (ZOFRAN) injection 4 mg (4 mg Intravenous Given 12/27/16 0651)  cefTRIAXone (ROCEPHIN) 1 g in dextrose 5 % 50 mL IVPB (0 g Intravenous Stopped 12/27/16 0832)  acetaminophen (TYLENOL) tablet 650 mg (650 mg Oral Given 12/27/16 0828)  sodium chloride 0.9 % bolus 1,000 mL (0 mLs Intravenous Stopped 12/27/16 1130)     Initial Impression / Assessment and Plan / ED Course  I have reviewed the triage vital signs and the nursing notes.  Pertinent labs & imaging results that were available during my care of the patient were reviewed by me and considered in my medical decision making (see chart for details).     BP 103/68 (BP Location: Left Arm)   Pulse 83   Temp 100.1 F (37.8 C) (Oral)   Resp (!) 22   Ht 5' (1.524 m)   Wt 73.9 kg (163 lb)   LMP 02/10/2014   SpO2 96%   BMI 31.83 kg/m    Final  Clinical Impressions(s) / ED Diagnoses   Final diagnoses:  Pyelonephritis    New Prescriptions New Prescriptions   CEPHALEXIN (KEFLEX) 500 MG CAPSULE    Take 1 capsule (500 mg total) by mouth 3 (three) times daily.   PROMETHAZINE (PHENERGAN) 25 MG TABLET    Take 1 tablet (25 mg total) by mouth every 6 (six) hours as needed for nausea.   6:36 AM Patient here with dysuria now with generalized bodyaches subjective fever and chills  concerning for pyelonephritis. She has a fairly benign abdominal exam. Workup initiated. IV fluid, pain medication, and antinausea medication given.  7:50 AM Normal WBC, normal lactic acid, electrolytes panel reassuring. Mild elevated i-STAT HCTZ of 11.8 however patient is not pregnant. Her urine is  consistent with a urinary tract infection. she is receiving Rocephin IV antibiotic, and Zofran for nausea, and morphine for pain.    12:12 PM Patient received 2 L of fluid while in the ER. She did have improvement of her heart rate. She also received Tylenol for fever. She feels much better after her treatment. She is stable for discharge with antibiotic. Urine culture sent. Return precaution discussed.   Fayrene Helper, PA-C 12/27/16 1238    Derwood Kaplan, MD 12/28/16 [phone]

## 2016-12-27 NOTE — ED Notes (Signed)
EDP aware of patient temp.

## 2016-12-27 NOTE — ED Notes (Signed)
Patient eating per EDP approval

## 2016-12-27 NOTE — Discharge Planning (Signed)
[facility]  She has upcoming appointment 7/27 at 3:45.

## 2016-12-27 NOTE — ED Triage Notes (Signed)
Pt reports gen body aches, fever/chills since Sunday. Pt states on Friday she had a "urinary tract infection" and "bought pills at [facility] to treat it". Pt presented pills that are for a yeast infection not a UTI. Pt appears to be in discomfort in triage.

## 2016-12-27 NOTE — ED Notes (Signed)
Patient was able to eat appropriately.

## 2016-12-29 LAB — URINE CULTURE: Culture: >=100000 — AB

## 2016-12-30 ENCOUNTER — Telehealth: Payer: Self-pay

## 2016-12-30 NOTE — Telephone Encounter (Signed)
Post ED Visit - Positive Culture Follow-up  Culture report reviewed by antimicrobial stewardship pharmacist:  []  Sheri Mann, Pharm.D. []  Sheri Mann, Pharm.D., BCPS AQ-ID []  Sheri Mann, Pharm.D., BCPS []  Sheri Mann, Pharm.D., BCPS []  TriadelphiaMinh Mann, [address] Rainbow BoulevardPharm.D., BCPS, AAHIVP []  Sheri Mann, Pharm.D., BCPS, AAHIVP []  Sheri Mann, PharmD, BCPS []  Sheri Mann, PharmD, BCPS []  Sheri Mann, PharmD, BCPS [facility] Positive urine culture Treated with Cephalexin, organism sensitive to the same and no further patient follow-up is required at this time.  Jerry CarasCullom, Jonnathan Birman Burnett 12/30/2016, 9:49 AM

## 2017-01-10 ENCOUNTER — Encounter: Payer: Self-pay | Admitting: Obstetrics & Gynecology

## 2017-01-12 ENCOUNTER — Ambulatory Visit: Payer: Self-pay | Admitting: Family Medicine

## 2017-01-31 ENCOUNTER — Encounter: Payer: Self-pay | Admitting: Obstetrics and Gynecology

## 2017-02-06 ENCOUNTER — Other Ambulatory Visit (HOSPITAL_COMMUNITY)
Admission: RE | Admit: 2017-02-06 | Discharge: 2017-02-06 | Disposition: A | Discharge status: Home / Self Care | Payer: Self-pay | Source: Ambulatory Visit | Attending: Obstetrics and Gynecology | Admitting: Obstetrics and Gynecology

## 2017-02-06 ENCOUNTER — Encounter: Payer: Self-pay | Admitting: Obstetrics and Gynecology

## 2017-02-06 ENCOUNTER — Ambulatory Visit (INDEPENDENT_AMBULATORY_CARE_PROVIDER_SITE_OTHER): Payer: Self-pay | Admitting: Obstetrics and Gynecology

## 2017-02-06 VITALS — BP 146/91 | HR 75 | Ht 59.0 in | Wt 164.0 lb

## 2017-02-06 DIAGNOSIS — N840 Polyp of corpus uteri: Secondary | ICD-10-CM | POA: Insufficient documentation

## 2017-02-06 DIAGNOSIS — N950 Postmenopausal bleeding: Secondary | ICD-10-CM

## 2017-02-06 DIAGNOSIS — Z3202 Encounter for pregnancy test, result negative: Secondary | ICD-10-CM

## 2017-02-06 DIAGNOSIS — N951 Menopausal and female climacteric states: Secondary | ICD-10-CM

## 2017-02-06 DIAGNOSIS — Z789 Other specified health status: Secondary | ICD-10-CM

## 2017-02-06 LAB — POCT PREGNANCY, URINE: Preg Test, Ur: NEGATIVE

## 2017-02-06 NOTE — Progress Notes (Signed)
Obstetrics and Gynecology New Patient Evaluation  Appointment Date: 02/06/2017  OBGYN Clinic: [facility]  Primary Care Provider: Dessa Mann  Referring Provider: Dessa Phi, MD  Chief Complaint:  Chief Complaint  Patient presents with  . Referral    PMB    History of Present Illness: Sheri Mann is a 51 y.o. Hispanic [id number] (Patient's last menstrual period was 11/11/2016 (exact date).), seen for the above chief complaint. Her past medical history is significant for HTN  Patient states she had been amenorrheic for 4 years prior to her 3 day episode of bleeding. It wasn't heavy (sounds like spotting or blood streaks) but she did have menstrual cramps at this time.     No breast s/s, fevers, chills, chest pain, SOB, nausea, vomiting, abdominal pain, dysuria, hematuria, vaginal itching, dyspareunia, diarrhea, constipation, blood in BMs.  Has some SUI and OAB s/s, hot flashes, night sweats and some vaginal dryness.   Review of Systems: as noted in the History of Present Illness.   Past Medical History:  Past Medical History:  Diagnosis Date  . High cholesterol 2013   . Hypertension   . Obesity (BMI 30-39.9)   . Urinary incontinence, mixed     Past Surgical History:  Past Surgical History:  Procedure Laterality Date  . BREAST LUMPECTOMY    . EYE SURGERY  2012  . TUBAL LIGATION  2000    Past Obstetrical History:  OB History  Gravida Para Term Preterm AB Living  5 5 5     5   SAB TAB Ectopic Multiple Live Births          5    # Outcome Date GA Lbr Len/2nd Weight Sex Delivery Anes PTL Lv  5 Term           4 Term           3 Term           2 Term           1 Term             Obstetric Comments  SVD x 5    Past Gynecological History: As per HPI. History of Pap Smear(s): Yes.   Last pap 2016, which was NILM/HPV neg She is currently using BTL for contraception.  No. HRT use.   Social History:  Social  History   Social History  . Marital status: Married    Spouse name: N/A  . Number of children: 5   . Years of education: 2    Occupational History  . Unemployed     Social History Main Topics  . Smoking status: Never Smoker  . Smokeless tobacco: Never Used  . Alcohol use No  . Drug use: No  . Sexual activity: Yes    Birth control/ protection: Surgical   Other Topics Concern  . Not on file   Social History Narrative   Lives with husband and brother in [place].    5 children grown and married.    Cannot read or write in spanish.    Husband can read and write in spanish or english.     Family History:  Family History  Problem Relation Age of Onset  . Cancer Neg Hx   . Heart disease Neg Hx   . Diabetes Neg Hx   . Hypertension Neg Hx     Health Maintenance:  Mammogram(s): Yes.   Date: 02/2016  Medications Sheri Mann had no medications administered  during this visit. Current Outpatient Prescriptions  Medication Sig Dispense Refill  . Multiple Vitamin (MULTIVITAMIN) capsule Take 1 capsule by mouth daily.     No current facility-administered medications for this visit.     Allergies Patient has no known allergies.   Physical Exam:  BP (!) 146/91   Pulse 75   Ht 4\' 11"  (1.499 m)   Wt 164 lb (74.4 kg)   LMP 11/11/2016 (Exact Date)   BMI 33.12 kg/m  Body mass index is 33.12 kg/m. General appearance: Well nourished, well developed female in no acute distress.  Cardiovascular: normal s1 and s2.  No murmurs, rubs or gallops. Respiratory:  Clear to auscultation bilateral. Normal respiratory effort Abdomen: positive bowel sounds and no masses, hernias; diffusely non tender to palpation, non distended Neuro/Psych:  Normal mood and affect.  Skin:  Warm and dry.  Lymphatic:  No inguinal lymphadenopathy.   Pelvic exam: is not limited by body habitus EGBUS: within normal limits with mild atrophy, Vagina: within normal limits with mild atrophy and with no blood or  discharge in the vault, Cervix: small, normal appearing cervix (almost flush with the vaginal off, off the wall about 1.5cm) without tenderness, discharge or lesions. Uterus:  nonenlarged and non tender and Adnexa:  normal adnexa and no mass, fullness, tenderness Rectovaginal: dferred  See procedure note for embx  Laboratory: UPT neg  Radiology: CLINICAL DATA:  51 year old postmenopausal female with left pelvic pain and bleeding.  EXAM: TRANSABDOMINAL AND TRANSVAGINAL ULTRASOUND OF PELVIS  TECHNIQUE: Both transabdominal and transvaginal ultrasound examinations of the pelvis were performed. Transabdominal technique was performed for global imaging of the pelvis including uterus, ovaries, adnexal regions, and pelvic cul-de-sac. It was necessary to proceed with endovaginal exam following the transabdominal exam to visualize the ovaries and endometrium.  COMPARISON:  None  FINDINGS: Uterus  Measurements: 8.2 x 4.2 x 4 cm. No fibroids or other mass visualized.  Endometrium  Thickness: 9 mm and slightly heterogeneous.  Right ovary  Measurements: 2.4 x 2 x 2 cm. Normal appearance/no adnexal mass.  Left ovary  Measurements: 2.2 x 0.9 x 1.4 cm. Normal appearance/no adnexal mass.  Other findings  No free fluid or adnexal mass.  IMPRESSION: Thickened and slightly heterogeneous endometrium (9 mm) in this postmenopausal patient. In the setting of post-menopausal bleeding, endometrial sampling is indicated to exclude carcinoma. If results are benign, sonohysterogram should be considered for focal lesion work-up. (Ref: Radiological Reasoning: Algorithmic Workup of Abnormal Vaginal Bleeding with Endovaginal Sonography and Sonohysterography. AJR 2008; 161:W96-04)  Normal ovaries.  No free fluid or adnexal mass.   Electronically Signed   By: Harmon Pier M.D.   On: 10/22/2016 15:13  Assessment: pt doing well  Plan:  1. Postmenopausal bleeding F/u final  pathology.  - Surgical pathology  2. Language barrier Interpreter used  Orders Placed This Encounter  Procedures  . Pregnancy, urine POC    RTC PRN  Cornelia Copa MD Attending Center for [employer] [facility])

## 2017-02-06 NOTE — Procedures (Signed)
Endometrial Biopsy Procedure Note  Pre-operative Diagnosis: PMB  Post-operative Diagnosis: same  Procedure Details  Urine pregnancy test was done and result was neg.  The risks (including infection, bleeding, pain, and uterine perforation) and benefits of the procedure were explained to the patient and Written informed consent was obtained.  The patient was placed in the dorsal lithotomy position.  Bimanual exam showed the uterus to be in the neutral position.  A Graves' speculum inserted in the vagina, and the cervix was visualized and prepped with povidone iodine, and a sharp tenaculum was applied to the anterior lip of the cervix for stabilization.  A pipelle was inserted into the uterine cavity and sounded the uterus to a depth of 7cm.  A Small amount of tissue was collected after 3 passes. The sample was sent for pathologic examination.  Condition: Stable  Complications: None  Plan: The patient was advised to call for any fever or for prolonged or severe pain or bleeding. She was advised to use OTC analgesics as needed for mild to moderate pain. She was advised to avoid vaginal intercourse for 48 hours or until the bleeding has completely stopped.  Cornelia Copa MD Attending Center for [facility])

## 2017-02-08 ENCOUNTER — Other Ambulatory Visit: Payer: Self-pay | Admitting: Family Medicine

## 2017-02-08 DIAGNOSIS — Z1231 Encounter for screening mammogram for malignant neoplasm of breast: Secondary | ICD-10-CM

## 2017-02-28 ENCOUNTER — Telehealth: Payer: Self-pay | Admitting: Obstetrics and Gynecology

## 2017-02-28 NOTE — Telephone Encounter (Addendum)
GYN Telephone Note  Patient called and told about benign path results but polyp was found. She denies any bleeding since the procedure. I told her that if she has any further bleeding to call us and recommend going to the OR for hyKoreasteroscopy D&C but if no further bleeding, nothing to do. Pt amenable to plan  Interpreter used (Raquel FlatoniaMora)  Cornelia Copaharlie Anelisse Jacobson, Jr MD [facility]ending [facility] 02/28/2017

## 2017-03-13 ENCOUNTER — Ambulatory Visit
Admission: RE | Admit: 2017-03-13 | Discharge: 2017-03-13 | Disposition: A | Discharge status: Home / Self Care | Payer: No Typology Code available for payment source | Source: Ambulatory Visit | Attending: Family Medicine | Admitting: Family Medicine

## 2017-03-13 DIAGNOSIS — Z1231 Encounter for screening mammogram for malignant neoplasm of breast: Secondary | ICD-10-CM

## 2017-03-16 ENCOUNTER — Ambulatory Visit: Payer: Self-pay | Attending: Internal Medicine

## 2017-09-04 ENCOUNTER — Ambulatory Visit: Payer: No Typology Code available for payment source

## 2017-09-14 ENCOUNTER — Ambulatory Visit: Payer: Self-pay | Attending: Internal Medicine

## 2017-09-19 ENCOUNTER — Ambulatory Visit: Payer: Self-pay | Attending: Internal Medicine | Admitting: Physician Assistant

## 2017-09-19 VITALS — BP 116/78 | HR 70 | Temp 98.0°F | Resp 18 | Ht 60.0 in | Wt 167.0 lb

## 2017-09-19 DIAGNOSIS — A084 Viral intestinal infection, unspecified: Secondary | ICD-10-CM | POA: Insufficient documentation

## 2017-09-19 DIAGNOSIS — E7800 Pure hypercholesterolemia, unspecified: Secondary | ICD-10-CM | POA: Insufficient documentation

## 2017-09-19 DIAGNOSIS — R110 Nausea: Secondary | ICD-10-CM | POA: Insufficient documentation

## 2017-09-19 DIAGNOSIS — I10 Essential (primary) hypertension: Secondary | ICD-10-CM | POA: Insufficient documentation

## 2017-09-19 DIAGNOSIS — E669 Obesity, unspecified: Secondary | ICD-10-CM | POA: Insufficient documentation

## 2017-09-19 DIAGNOSIS — Z791 Long term (current) use of non-steroidal anti-inflammatories (NSAID): Secondary | ICD-10-CM | POA: Insufficient documentation

## 2017-09-19 DIAGNOSIS — Z79899 Other long term (current) drug therapy: Secondary | ICD-10-CM | POA: Insufficient documentation

## 2017-09-19 DIAGNOSIS — M25561 Pain in right knee: Secondary | ICD-10-CM | POA: Insufficient documentation

## 2017-09-19 MED ORDER — IBUPROFEN 600 MG PO TABS: 600.0000 mg | ORAL_TABLET | Freq: Three times a day (TID) | ORAL | 0 refills | Status: DC | PRN

## 2017-09-19 MED ORDER — ONDANSETRON HCL 8 MG PO TABS: 8.0000 mg | ORAL_TABLET | Freq: Three times a day (TID) | ORAL | 0 refills | Status: DC | PRN

## 2017-09-19 NOTE — Progress Notes (Signed)
Patient ID: Sheri Mann, female   DOB: Jun 09, 1966, 52 y.o.   MRN: [id number]      Sheri Mann, is a 52 y.o. female  EXB:[id number]  [id number]:[id number]  DOB - [birth year]  Subjective:  Chief Complaint and HPI: Sheri Mann is a 52 y.o. female here today R knee pain X 2 months.  NKI.  No f/c.  Also, 2 days ago had stomach upset and diarrhea with a couple of episodes of vomiting.  No f/c.  Has mostly resolved.  She still feels a little nauseated.  No further vomiting or diarrhea yesterday or today.  Appetite is returning.    "Sheri Mann" with [employer].   ROS:   Constitutional:  No f/c, No night sweats, No unexplained weight loss. EENT:  No vision changes, No blurry vision, No hearing changes. No mouth, throat, or ear problems.  Respiratory: No cough, No SOB Cardiac: No CP, no palpitations GI:  No abd pain.   GU: No Urinary s/sx Musculoskeletal: +R knee pain Neuro: No headache, no dizziness, no motor weakness.  Skin: No rash Endocrine:  No polydipsia. No polyuria.  Psych: Denies SI/HI  No problems updated.  ALLERGIES: No Known Allergies  PAST MEDICAL HISTORY: Past Medical History:  Diagnosis Date  . High cholesterol 2013   . Hypertension   . Obesity (BMI 30-39.9)   . Urinary incontinence, mixed     MEDICATIONS AT HOME: Prior to Admission medications   Medication Sig Start Date End Date Taking? Authorizing Provider  ibuprofen (ADVIL,MOTRIN) 600 MG tablet Take 1 tablet (600 mg total) by mouth every 8 (eight) hours as needed. 09/19/17   Anders Simmonds, PA-C  Multiple Vitamin (MULTIVITAMIN) capsule Take 1 capsule by mouth daily.    [provider]  ondansetron (ZOFRAN) 8 MG tablet Take 1 tablet (8 mg total) by mouth every 8 (eight) hours as needed for nausea or vomiting. 09/19/17   Anders Simmonds, PA-C     Objective:  EXAM:   Vitals:   09/19/17 1550  BP: 116/78  Pulse: 70  Resp: 18  Temp: 98 F (36.7 C)  TempSrc: Oral  SpO2: 98%    Weight: 167 lb (75.8 kg)  Height: 5' (1.524 m)    General appearance : A&OX3. NAD. Non-toxic-appearing HEENT: Atraumatic and Normocephalic.  PERRLA. EOM intact.  TM clear B. Mouth-MMM, post pharynx WNL w/o erythema, No PND. Neck: supple, no JVD. No cervical lymphadenopathy. No thyromegaly Chest/Lungs:  Breathing-non-labored, Good air entry bilaterally, breath sounds normal without rales, rhonchi, or wheezing  CVS: S1 S2 regular, no murmurs, gallops, rubs  Abdomen: Bowel sounds present, Non tender and not distended with no gaurding, rigidity or rebound. Extremities: Bilateral Lower Ext shows no edema, both legs are warm to touch with = pulse throughout.  R vs L knee examined.  No visible/gross abnormality.  No erythema.  No ballotment.  TTP medially but joints are stable.  No effusion.  ROM is essentially normal Neurology:  CN II-XII grossly intact, Non focal.   Psych:  TP linear. J/I WNL. Normal speech. Appropriate eye contact and affect.  Skin:  No Rash  Data Review Lab Results  Component Value Date   HGBA1C 5.90 01/28/2015     Assessment & Plan   1. Acute pain of right knee - DG Knee 1-2 Views Right - ibuprofen (ADVIL,MOTRIN) 600 MG tablet; Take 1 tablet (600 mg total) by mouth every 8 (eight) hours as needed.  Dispense: 30 tablet; Refill: 0 Consider ortho referral.  2. Nausea Resolving viral gastroenteritis - ondansetron (ZOFRAN) 8 MG tablet; Take 1 tablet (8 mg total) by mouth every 8 (eight) hours as needed for nausea or vomiting.  Dispense: 20 tablet; Refill: 0   Patient have been counseled extensively about nutrition and exercise  Return in about 1 month (around 10/17/2017) for assign PCP; f/up knee pain.  The patient was given clear instructions to go to ER or return to medical center if symptoms don't improve, worsen or new problems develop. The patient verbalized understanding. The patient was told to call to get lab results if they haven't heard anything in the  next week.     Georgian CoAngela Jozette Castrellon, PA-C [facility], [place] [phone]   09/19/2017, 3:58 PM

## 2017-11-19 ENCOUNTER — Ambulatory Visit: Payer: Self-pay | Attending: Nurse Practitioner | Admitting: Nurse Practitioner

## 2017-11-19 ENCOUNTER — Ambulatory Visit (HOSPITAL_COMMUNITY)
Admission: RE | Admit: 2017-11-19 | Discharge: 2017-11-19 | Disposition: A | Discharge status: Home / Self Care | Payer: No Typology Code available for payment source | Source: Ambulatory Visit | Attending: Nurse Practitioner | Admitting: Nurse Practitioner

## 2017-11-19 ENCOUNTER — Encounter: Payer: Self-pay | Admitting: Nurse Practitioner

## 2017-11-19 VITALS — BP 122/83 | HR 80 | Temp 98.6°F | Ht 60.0 in | Wt 170.0 lb

## 2017-11-19 DIAGNOSIS — M25561 Pain in right knee: Secondary | ICD-10-CM

## 2017-11-19 DIAGNOSIS — M818 Other osteoporosis without current pathological fracture: Secondary | ICD-10-CM | POA: Insufficient documentation

## 2017-11-19 DIAGNOSIS — E669 Obesity, unspecified: Secondary | ICD-10-CM | POA: Insufficient documentation

## 2017-11-19 DIAGNOSIS — G8929 Other chronic pain: Secondary | ICD-10-CM | POA: Insufficient documentation

## 2017-11-19 DIAGNOSIS — I10 Essential (primary) hypertension: Secondary | ICD-10-CM | POA: Insufficient documentation

## 2017-11-19 DIAGNOSIS — Z6830 Body mass index (BMI) 30.0-30.9, adult: Secondary | ICD-10-CM | POA: Insufficient documentation

## 2017-11-19 MED ORDER — DICLOFENAC SODIUM 1 % TD GEL: 4.0000 g | Freq: Four times a day (QID) | TRANSDERMAL | 1 refills | Status: DC

## 2017-11-19 MED ORDER — TRAMADOL HCL 50 MG PO TABS
50.0000 mg | ORAL_TABLET | Freq: Three times a day (TID) | ORAL | 0 refills | Status: DC | PRN
Start: 2017-11-19 — End: 2017-12-14

## 2017-11-19 NOTE — Progress Notes (Signed)
Assessment & Plan:  Sheri Mann was seen today for establish care and knee pain.  Diagnoses and all orders for this visit:  Chronic pain of right knee -     traMADol (ULTRAM) 50 MG tablet; Take 1 tablet (50 mg total) by mouth every 8 (eight) hours as needed for moderate pain or severe pain. -     diclofenac sodium (VOLTAREN) 1 % GEL; Apply 4 g topically 4 (four) times daily. -     DG Knee Complete 4 Views Right; Future Patient was given ACE WRAP for the right knee  Work on losing weight to help reduce right knee pain. May alternate with heat and ice application for pain relief. May also alternate with acetaminophen and Ibuprofen as prescribed for pain.  Follow up 2 weeks  Essential hypertension -     Basic metabolic panel -     CBC -     Lipid panel DASH/Mediterranean Diets are healthier choices for HTN.    Patient has been counseled on age-appropriate routine health concerns for screening and prevention. These are reviewed and up-to-date. Referrals have been placed accordingly. Immunizations are up-to-date or declined.    Subjective:   Chief Complaint  Patient presents with  . Establish Care  . Knee Pain    Pt. stated her right knee hurts her and she cried yesterday because of it.    HPI Sheri Mann 52 y.o. female presents to office today to establish care.    Right Knee Pain She has significant right knee pain and swelling. She saw a provider in this office September 19, 2017 who ordered a right knee xray however patient states today she was not aware that she had an xray ordered. She will go have the xray performed today. She has been taking ibuprofen 600-800 mg with no relief of her right knee pain.  Aggravating factors: Hyperflexion of the right knee.  Onset of symptoms 6 to 7 months ago.  Relieving factors: NONE.   Hypertension Chronic however she denies ever taking any medications for hypertension. Blood pressure is well controlled today. Denies chest pain, shortness of  breath, palpitations, lightheadedness, dizziness, headaches or BLE edema.  BP Readings from Last 3 Encounters:  11/19/17 122/83  09/19/17 116/78  02/06/17 (!) 146/91    Hyperlipidemia Patient presents for follow up to hyperlipidemia.  She is currently not taking any statin for hyperlipidemia.  She is not diet or exercise compliant and denies poor exercise tolerance and skin xanthelasma or statin intolerance including myalgias. She is due for fasting lipid panel however she is currently not fasting so will defer blood work today until next office visit. Lab Results  Component Value Date   CHOL 199 04/09/2014   Lab Results  Component Value Date   HDL 51 04/09/2014   Lab Results  Component Value Date   LDLCALC 120 (H) 04/09/2014   Lab Results  Component Value Date   TRIG 141 04/09/2014   Lab Results  Component Value Date   CHOLHDL 3.9 04/09/2014   Review of Systems  Constitutional: Negative for fever, malaise/fatigue and weight loss.  HENT: Negative.  Negative for nosebleeds.   Eyes: Negative.  Negative for blurred vision, double vision and photophobia.  Respiratory: Negative.  Negative for cough and shortness of breath.   Cardiovascular: Positive for leg swelling. Negative for chest pain and palpitations.  Gastrointestinal: Negative.  Negative for heartburn, nausea and vomiting.  Musculoskeletal: Positive for joint pain. Negative for myalgias.  Neurological: Negative.  Negative  for dizziness, focal weakness, seizures and headaches.  Psychiatric/Behavioral: Negative.  Negative for suicidal ideas.    Past Medical History:  Diagnosis Date  . High cholesterol 2013   . Hypertension   . Obesity (BMI 30-39.9)   . Urinary incontinence, mixed     Past Surgical History:  Procedure Laterality Date  . BREAST EXCISIONAL BIOPSY Left   . BREAST LUMPECTOMY    . EYE SURGERY  2012  . TUBAL LIGATION  2000    Family History  Problem Relation Age of Onset  . Cancer Neg Hx   .  Heart disease Neg Hx   . Diabetes Neg Hx   . Hypertension Neg Hx   . Breast cancer Neg Hx     Social History Reviewed with no changes to be made today.   Outpatient Medications Prior to Visit  Medication Sig Dispense Refill  . Multiple Vitamin (MULTIVITAMIN) capsule Take 1 capsule by mouth daily.    Marland Kitchen ibuprofen (ADVIL,MOTRIN) 600 MG tablet Take 1 tablet (600 mg total) by mouth every 8 (eight) hours as needed. (Patient not taking: Reported on 11/19/2017) 30 tablet 0  . ondansetron (ZOFRAN) 8 MG tablet Take 1 tablet (8 mg total) by mouth every 8 (eight) hours as needed for nausea or vomiting. (Patient not taking: Reported on 11/19/2017) 20 tablet 0   No facility-administered medications prior to visit.     No Known Allergies     Objective:    BP 122/83 (BP Location: Left Arm, Patient Position: Sitting, Cuff Size: Normal)   Pulse 80   Temp 98.6 F (37 C) (Oral)   Ht 5' (1.524 m)   Wt 170 lb (77.1 kg)   LMP 11/11/2016 (Exact Date)   SpO2 96%   BMI 33.20 kg/m  Wt Readings from Last 3 Encounters:  11/19/17 170 lb (77.1 kg)  09/19/17 167 lb (75.8 kg)  02/06/17 164 lb (74.4 kg)    Physical Exam  Constitutional: She is oriented to person, place, and time. She appears well-developed and well-nourished. She is cooperative.  HENT:  Head: Normocephalic and atraumatic.  Eyes: EOM are normal.  Neck: Normal range of motion.  Cardiovascular: Normal rate, regular rhythm, normal heart sounds and intact distal pulses. Exam reveals no gallop and no friction rub.  No murmur heard. Pulmonary/Chest: Effort normal and breath sounds normal. No tachypnea. No respiratory distress. She has no decreased breath sounds. She has no wheezes. She has no rhonchi. She has no rales. She exhibits no tenderness.  Abdominal: Soft. Bowel sounds are normal.  Musculoskeletal: She exhibits edema and tenderness.       Right knee: She exhibits decreased range of motion, swelling and abnormal patellar mobility. She  exhibits no erythema. Tenderness found. Medial joint line and patellar tendon tenderness noted.  Neurological: She is alert and oriented to person, place, and time. Coordination normal.  Skin: Skin is warm and dry.  Psychiatric: She has a normal mood and affect. Her behavior is normal. Judgment and thought content normal.  Nursing note and vitals reviewed.      Patient has been counseled extensively about nutrition and exercise as well as the importance of adherence with medications and regular follow-up. The patient was given clear instructions to go to ER or return to medical center if symptoms don't improve, worsen or new problems develop. The patient verbalized understanding.   Follow-up: Return in about 2 weeks (around 12/03/2017) for right knee pain.   Claiborne Rigg, FNP-BC [facility] [place], [place] [phone]   11/19/2017, 5:51 PM

## 2017-11-20 ENCOUNTER — Telehealth: Payer: Self-pay

## 2017-11-20 LAB — BASIC METABOLIC PANEL
BUN / CREAT RATIO: 16 (ref 9–23)
BUN: 13 mg/dL (ref 6–24)
CALCIUM: 10.1 mg/dL (ref 8.7–10.2)
CHLORIDE: 104 mmol/L (ref 96–106)
CO2: 21 mmol/L (ref 20–29)
Creatinine, Ser: 0.8 mg/dL (ref 0.57–1.00)
GFR calc Af Amer: 99 mL/min/{1.73_m2} (ref >59)
GFR, EST NON AFRICAN AMERICAN: 86 mL/min/{1.73_m2} (ref >59)
Glucose: 122 mg/dL — ABNORMAL HIGH (ref 65–99)
POTASSIUM: 3.8 mmol/L (ref 3.5–5.2)
Sodium: 143 mmol/L (ref 134–144)

## 2017-11-20 LAB — CBC
HEMATOCRIT: 39.1 % (ref 34.0–46.6)
HEMOGLOBIN: 13.2 g/dL (ref 11.1–15.9)
MCH: 28.9 pg (ref 26.6–33.0)
MCHC: 33.8 g/dL (ref 31.5–35.7)
MCV: 86 fL (ref 79–97)
Platelets: 180 10*3/uL (ref 150–450)
RBC: 4.56 x10E6/uL (ref 3.77–5.28)
RDW: 13.6 % (ref 12.3–15.4)
WBC: 5.5 10*3/uL (ref 3.4–10.8)

## 2017-11-20 LAB — LIPID PANEL
CHOL/HDL RATIO: 4.2 ratio (ref 0.0–4.4)
Cholesterol, Total: 192 mg/dL (ref 100–199)
HDL: 46 mg/dL (ref >39)
LDL CALC: 96 mg/dL (ref 0–99)
Triglycerides: 250 mg/dL — ABNORMAL HIGH (ref 0–149)
VLDL Cholesterol Cal: 50 mg/dL — ABNORMAL HIGH (ref 5–40)

## 2017-11-20 NOTE — Telephone Encounter (Signed)
-----   Message from Claiborne RiggZelda W Fleming, NP sent at 11/19/2017  6:09 PM EDT ----- Herby AbrahamXray of knee shows that you are missing important nutrients from your knee. I would like for you to take an OTC calcium and vitamin D supplement together. We will also need to draw a few labs including vitamin d at your next office appointment. Continue to wrap your knee with the ace wrap as well.

## 2017-11-20 NOTE — Telephone Encounter (Signed)
CMA spoke to patient to inform on knee X-ray results. Patient understood and was also inform to purchase calcium and vitamin D supplement.  Pt. Stated the Tramadol was giving her side effects of dizziness.  CMA informed patient that the Tramadol is taken when she need for pain and not to take it daily.  CMA also informed patient if she do not want to continue using it, she can stop taking it and is only when she need it for pain every 8 hrs when needed.   Pt. Understood.  Pt. Verified DOB.  Spanish interpreter Bunnie PionLuzero [phone] assist with the call.

## 2017-11-26 ENCOUNTER — Telehealth: Payer: Self-pay

## 2017-11-26 NOTE — Telephone Encounter (Signed)
-----   Message from Claiborne RiggZelda W Fleming, NP sent at 11/20/2017 10:00 PM EDT ----- Labs are essentially normal. Make sure you are drinking at least 48 oz of water per day. Work on eating a low fat, heart healthy diet and participate in regular aerobic exercise program to control as well. Exercise at least 150 minutes per week.No fried foods. No junk foods, sodas, sugary drinks, unhealthy snacking, alcohol or smoking.

## 2017-11-26 NOTE — Telephone Encounter (Signed)
CMA attempt to call patient to inform on lab results. No answer and left a VM for patient to call back. Spanish interpreter Arlys JohnBrian [phone] assist with the call.  If patient call back, please inform:  Labs are essentially normal. Make sure you are drinking at least 48 oz of water per day. Work on eating a low fat, heart healthy diet and participate in regular aerobic exercise program to control as well. Exercise at least 150 minutes per week.No fried foods. No junk foods, sodas, sugary drinks, unhealthy snacking, alcohol or smoking.

## 2017-12-14 ENCOUNTER — Encounter: Payer: Self-pay | Admitting: Nurse Practitioner

## 2017-12-14 ENCOUNTER — Ambulatory Visit: Payer: Self-pay | Attending: Nurse Practitioner | Admitting: Nurse Practitioner

## 2017-12-14 VITALS — BP 130/87 | HR 75 | Temp 98.7°F | Ht 60.0 in | Wt 169.4 lb

## 2017-12-14 DIAGNOSIS — G8929 Other chronic pain: Secondary | ICD-10-CM | POA: Insufficient documentation

## 2017-12-14 DIAGNOSIS — M25561 Pain in right knee: Secondary | ICD-10-CM | POA: Insufficient documentation

## 2017-12-14 MED ORDER — NAPROXEN 500 MG PO TABS: 500.0000 mg | ORAL_TABLET | Freq: Two times a day (BID) | ORAL | 1 refills | Status: DC

## 2017-12-14 MED ORDER — DICLOFENAC SODIUM 1 % TD GEL: 4.0000 g | Freq: Four times a day (QID) | TRANSDERMAL | 1 refills | Status: AC

## 2017-12-14 NOTE — Progress Notes (Signed)
Assessment & Plan:  Sheri Mann was seen today for knee pain.  Diagnoses and all orders for this visit:  Chronic pain of right knee -     naproxen (NAPROSYN) 500 MG tablet; Take 1 tablet (500 mg total) by mouth 2 (two) times daily with a meal. -     diclofenac sodium (VOLTAREN) 1 % GEL; Apply 4 g topically 4 (four) times daily. Work on losing weight to help reduce knee pain. May alternate with heat and ice application for pain relief. May also alternate with acetaminophen as prescribed for back pain. Other alternatives include massage, acupuncture and water aerobics.  You must stay active and avoid a sedentary lifestyle.   Patient has been counseled on age-appropriate routine health concerns for screening and prevention. These are reviewed and up-to-date. Referrals have been placed accordingly. Immunizations are up-to-date or declined.    Subjective:   Chief Complaint  Patient presents with  . Knee Pain    Pt. is here for right knee pain. Pt. stated when she walks it hurts more.    HPI Sheri Mann 52 y.o. female presents to office today for follow up of Right knee pain. She was prescribed tramadol and diclofenac gel a few weeks ago for knee pain. However the tramadol caused dizziness and GI upset. Will try Naproxen today and refill diclofenac.   Knee Pain Patient presents with knee and swelling involving the  right knee. Onset of the symptoms was several months ago. Inciting event: none known. Current symptoms include pain located in the patellar region as well as medial meniscal region, stiffness and swelling. Pain is aggravated by any weight bearing.  Patient has had prior knee problems. Evaluation to date: plain films: Osseous demineralization. Treatment to date: OTC analgesics which are ineffective and prescription NSAIDS which are effective.  Review of Systems  Constitutional: Negative for fever, malaise/fatigue and weight loss.  HENT: Negative.  Negative for nosebleeds.     Eyes: Negative.  Negative for blurred vision, double vision and photophobia.  Respiratory: Negative.  Negative for cough and shortness of breath.   Cardiovascular: Negative.  Negative for chest pain, palpitations and leg swelling.  Gastrointestinal: Negative.  Negative for heartburn, nausea and vomiting.  Genitourinary:       Urinary incontinence  Musculoskeletal: Positive for joint pain. Negative for myalgias.       SEE HPI  Neurological: Negative.  Negative for dizziness, focal weakness, seizures and headaches.  Psychiatric/Behavioral: Negative.  Negative for suicidal ideas.    Past Medical History:  Diagnosis Date  . High cholesterol 2013   . Hypertension   . Obesity (BMI 30-39.9)   . Urinary incontinence, mixed     Past Surgical History:  Procedure Laterality Date  . BREAST EXCISIONAL BIOPSY Left   . BREAST LUMPECTOMY    . EYE SURGERY  2012  . TUBAL LIGATION  2000    Family History  Problem Relation Age of Onset  . Cancer Neg Hx   . Heart disease Neg Hx   . Diabetes Neg Hx   . Hypertension Neg Hx   . Breast cancer Neg Hx     Social History Reviewed with no changes to be made today.   Outpatient Medications Prior to Visit  Medication Sig Dispense Refill  . Calcium Carbonate-Vitamin D 600-400 MG-UNIT chew tablet Chew 1 tablet by mouth daily.    . Omega-3 300 MG CAPS Take by mouth.    . diclofenac sodium (VOLTAREN) 1 % GEL Apply 4 g topically  4 (four) times daily. 100 g 1  . Multiple Vitamin (MULTIVITAMIN) capsule Take 1 capsule by mouth daily.    . traMADol (ULTRAM) 50 MG tablet Take 1 tablet (50 mg total) by mouth every 8 (eight) hours as needed for moderate pain or severe pain. (Patient not taking: Reported on 12/14/2017) 60 tablet 0   No facility-administered medications prior to visit.     No Known Allergies     Objective:    BP 130/87 (BP Location: Left Arm, Patient Position: Sitting, Cuff Size: Normal)   Pulse 75   Temp 98.7 F (37.1 C) (Oral)   Ht  5' (1.524 m)   Wt 169 lb 6.4 oz (76.8 kg)   LMP 11/11/2016 (Exact Date)   SpO2 97%   BMI 33.08 kg/m  Wt Readings from Last 3 Encounters:  12/14/17 169 lb 6.4 oz (76.8 kg)  11/19/17 170 lb (77.1 kg)  09/19/17 167 lb (75.8 kg)    Physical Exam  Constitutional: She is oriented to person, place, and time. She appears well-developed and well-nourished. She is cooperative.  HENT:  Head: Normocephalic and atraumatic.  Eyes: EOM are normal.  Neck: Normal range of motion.  Cardiovascular: Normal rate, regular rhythm, normal heart sounds and intact distal pulses. Exam reveals no gallop and no friction rub.  No murmur heard. Pulmonary/Chest: Effort normal and breath sounds normal. No tachypnea. No respiratory distress. She has no decreased breath sounds. She has no wheezes. She has no rhonchi. She has no rales. She exhibits no tenderness.  Abdominal: Soft. Bowel sounds are normal.  Musculoskeletal: She exhibits edema and tenderness.       Right knee: She exhibits decreased range of motion, swelling, abnormal patellar mobility and MCL laxity. Tenderness found. Medial joint line, MCL and patellar tendon tenderness noted.  Neurological: She is alert and oriented to person, place, and time. Coordination normal.  Skin: Skin is warm and dry.  Psychiatric: She has a normal mood and affect. Her behavior is normal. Judgment and thought content normal.  Nursing note and vitals reviewed.      Patient has been counseled extensively about nutrition and exercise as well as the importance of adherence with medications and regular follow-up. The patient was given clear instructions to go to ER or return to medical center if symptoms don't improve, worsen or new problems develop. The patient verbalized understanding.   Follow-up: Return in about 6 weeks (around 01/25/2018) for right knee pain, ALSO Needs appointment with financial representative.Claiborne Rigg, FNP-BC [facility] and  [place], [place] [phone]   12/14/2017, 3:28 PM

## 2017-12-14 NOTE — Patient Instructions (Signed)
Dolor de rodilla  (Knee Pain)  El dolor de rodilla es un problema frecuente y puede tener muchas causas. A menudo desaparece si se siguen las instrucciones del médico para el cuidado en el hogar. El tratamiento del dolor continuo dependerá de su causa. Si el dolor persiste, tal vez haya que realizar más estudios para diagnosticar la afección, los cuales pueden incluir radiografías u otros estudios de diagnóstico por imágenes de la rodilla.  CUIDADOS EN EL HOGAR  · Tome los medicamentos solamente como se lo haya indicado el médico.  · Mantenga la rodilla en reposo y en alto (elevada) mientras está descansando.  · No haga cosas que le causen dolor o que lo intensifiquen.  · Evite las actividades en las que ambos pies se separan del suelo al mismo tiempo, por ejemplo, correr, saltar la soga o hacer saltos de tijera.  · Aplique hielo sobre la zona de la rodilla:  ? Ponga el hielo en una bolsa plástica.  ? Coloque una toalla entre la piel y la bolsa de hielo.  ? Coloque el hielo durante 20 minutos, 2 a 3 veces por día.  · Pregúntele al médico si debe usar una rodillera elástica.  · Duerma con una almohada debajo de la rodilla.  · Baje de peso si es necesario. El sobrepeso puede aumentar el dolor de rodilla.  · No consuma ningún producto que contenga tabaco, lo que incluye cigarrillos, tabaco de mascar o cigarrillos electrónicos. Si necesita ayuda para dejar de fumar, consulte al médico. Fumar puede retrasar la curación de cualquier problema que tenga en el hueso y la articulación.    SOLICITE AYUDA SI:  · El dolor de rodilla no desaparece, cambia o empeora.  · Tiene fiebre junto con dolor de rodilla.  · La rodilla le falla o se le queda trabada.  · La rodilla está más hinchada.    SOLICITE AYUDA DE INMEDIATO SI:  · La rodilla está caliente al tacto.  · Tiene dolor en el pecho o dificultad para respirar.    Esta información no tiene como fin reemplazar el consejo del médico.  Asegúrese de hacerle al médico cualquier pregunta que tenga.  Document Released: 12/31/2013 Document Revised: 12/31/2013 Document Reviewed: 08/06/2013  Elsevier Interactive Patient Education © 2017 [employer]

## 2017-12-21 ENCOUNTER — Ambulatory Visit: Payer: No Typology Code available for payment source | Attending: Nurse Practitioner

## 2018-01-28 ENCOUNTER — Ambulatory Visit: Payer: No Typology Code available for payment source | Admitting: Nurse Practitioner

## 2018-01-29 ENCOUNTER — Ambulatory Visit: Payer: Self-pay | Attending: Nurse Practitioner | Admitting: Nurse Practitioner

## 2018-01-29 ENCOUNTER — Encounter: Payer: Self-pay | Admitting: Nurse Practitioner

## 2018-01-29 VITALS — BP 132/90 | HR 65 | Temp 98.0°F | Ht 60.0 in | Wt 172.6 lb

## 2018-01-29 DIAGNOSIS — Z9889 Other specified postprocedural states: Secondary | ICD-10-CM | POA: Insufficient documentation

## 2018-01-29 DIAGNOSIS — Z9851 Tubal ligation status: Secondary | ICD-10-CM | POA: Insufficient documentation

## 2018-01-29 DIAGNOSIS — Z6833 Body mass index (BMI) 33.0-33.9, adult: Secondary | ICD-10-CM | POA: Insufficient documentation

## 2018-01-29 DIAGNOSIS — R0981 Nasal congestion: Secondary | ICD-10-CM | POA: Insufficient documentation

## 2018-01-29 DIAGNOSIS — Z79899 Other long term (current) drug therapy: Secondary | ICD-10-CM | POA: Insufficient documentation

## 2018-01-29 DIAGNOSIS — M25561 Pain in right knee: Secondary | ICD-10-CM | POA: Insufficient documentation

## 2018-01-29 DIAGNOSIS — I10 Essential (primary) hypertension: Secondary | ICD-10-CM | POA: Insufficient documentation

## 2018-01-29 DIAGNOSIS — E669 Obesity, unspecified: Secondary | ICD-10-CM | POA: Insufficient documentation

## 2018-01-29 DIAGNOSIS — G8929 Other chronic pain: Secondary | ICD-10-CM | POA: Insufficient documentation

## 2018-01-29 MED ORDER — FLUTICASONE PROPIONATE 50 MCG/ACT NA SUSP: 2.0000 | Freq: Every day | NASAL | 6 refills | Status: DC

## 2018-01-29 NOTE — Patient Instructions (Signed)

## 2018-01-29 NOTE — Progress Notes (Signed)
Assessment & Plan:  Sheri Mann was seen today for follow-up.  Diagnoses and all orders for this visit:  Chronic pain of right knee -     MR Knee Right Wo Contrast; Future Work on losing weight to help reduce knee pain. May alternate with heat and ice application for pain relief. May also alternate with acetaminophen and Ibuprofen as prescribed for back pain. Other alternatives include massage, acupuncture and water aerobics.  You must stay active and avoid a sedentary lifestyle.   Nasal congestion -     fluticasone (FLONASE) 50 MCG/ACT nasal spray; Place 2 sprays into both nostrils daily.    Patient has been counseled on age-appropriate routine health concerns for screening and prevention. These are reviewed and up-to-date. Referrals have been placed accordingly. Immunizations are up-to-date or declined.    Subjective:   Chief Complaint  Patient presents with  . Follow-up    Pt. is here to follow-up on her right knee pain. Pt. stated it do not hurt as much but it hurts when she put weight on it.    HPI Sheri Mann 52 y.o. female presents to office today for follow up to right knee pain. VRI was used to communicate directly with patient for the entire encounter including providing detailed patient instructions.   Right Knee Pain Chronic with onset several months ago. She denies any trauma or injury. Aggravating factors: weight bearing, heavy lifting, prolonged walking. Relieving factors: Rest. Pain is located in the lateral and medial meniscal area. There is associated stiffness and swelling. Pain is somewhat relieved with OTC NSAIDS but continues to be ongoing.    Nasal Congestion She endorses significant nasal congestion and headache over the past few days. She denies any history of allergic rhinitis or any other URI symptoms today. Congestion is worse in the morning and seems to lessen as the day progresses.    Review of Systems  Constitutional: Negative for fever,  malaise/fatigue and weight loss.  HENT: Positive for congestion. Negative for nosebleeds.   Eyes: Negative.  Negative for blurred vision, double vision and photophobia.  Respiratory: Negative.  Negative for cough and shortness of breath.   Cardiovascular: Negative.  Negative for chest pain, palpitations and leg swelling.  Gastrointestinal: Negative.  Negative for heartburn, nausea and vomiting.  Musculoskeletal: Positive for joint pain. Negative for falls and myalgias.       SEE HPI  Neurological: Positive for headaches. Negative for dizziness, focal weakness and seizures.  Endo/Heme/Allergies: Negative for environmental allergies.  Psychiatric/Behavioral: Negative.  Negative for suicidal ideas.    Past Medical History:  Diagnosis Date  . High cholesterol 2013   . Hypertension   . Obesity (BMI 30-39.9)   . Urinary incontinence, mixed     Past Surgical History:  Procedure Laterality Date  . BREAST EXCISIONAL BIOPSY Left   . BREAST LUMPECTOMY    . EYE SURGERY  2012  . TUBAL LIGATION  2000    Family History  Problem Relation Age of Onset  . Cancer Neg Hx   . Heart disease Neg Hx   . Diabetes Neg Hx   . Hypertension Neg Hx   . Breast cancer Neg Hx     Social History Reviewed with no changes to be made today.   Outpatient Medications Prior to Visit  Medication Sig Dispense Refill  . Calcium Carbonate-Vitamin D 600-400 MG-UNIT chew tablet Chew 1 tablet by mouth daily.    . Multiple Vitamin (MULTIVITAMIN) capsule Take 1 capsule by mouth daily.    .Marland Kitchen  naproxen (NAPROSYN) 500 MG tablet Take 1 tablet (500 mg total) by mouth 2 (two) times daily with a meal. 60 tablet 1  . Omega-3 300 MG CAPS Take by mouth.     No facility-administered medications prior to visit.     No Known Allergies     Objective:    BP 132/90 (BP Location: Left Arm, Patient Position: Sitting, Cuff Size: Normal)   Pulse 65   Temp 98 F (36.7 C) (Oral)   Ht 5' (1.524 m)   Wt 172 lb 9.6 oz (78.3 kg)    LMP 11/11/2016 (Exact Date)   SpO2 96%   BMI 33.71 kg/m  Wt Readings from Last 3 Encounters:  01/29/18 172 lb 9.6 oz (78.3 kg)  12/14/17 169 lb 6.4 oz (76.8 kg)  11/19/17 170 lb (77.1 kg)    Physical Exam  Constitutional: She is oriented to person, place, and time. She appears well-developed and well-nourished. She is cooperative.  HENT:  Head: Normocephalic and atraumatic.  Right Ear: A middle ear effusion is present.  Left Ear: A middle ear effusion is present.  Nose: Mucosal edema and rhinorrhea present. Right sinus exhibits no maxillary sinus tenderness and no frontal sinus tenderness. Left sinus exhibits no maxillary sinus tenderness and no frontal sinus tenderness.  Mouth/Throat: No posterior oropharyngeal edema or posterior oropharyngeal erythema.  Cardiovascular: Normal rate, regular rhythm, normal heart sounds and intact distal pulses. Exam reveals no gallop and no friction rub.  No murmur heard. Pulmonary/Chest: Effort normal and breath sounds normal. No tachypnea. No respiratory distress. She has no decreased breath sounds. She has no wheezes. She has no rhonchi. She has no rales. She exhibits no tenderness.  Abdominal: Soft. Bowel sounds are normal.  Musculoskeletal: She exhibits edema and tenderness. She exhibits no deformity.       Right knee: She exhibits decreased range of motion and swelling. Tenderness found. MCL and LCL tenderness noted.  Neurological: She is alert and oriented to person, place, and time. Coordination normal.  Skin: Skin is warm and dry.  Psychiatric: She has a normal mood and affect. Her behavior is normal. Judgment and thought content normal.  Nursing note and vitals reviewed.        Patient has been counseled extensively about nutrition and exercise as well as the importance of adherence with medications and regular follow-up. The patient was given clear instructions to go to ER or return to medical center if symptoms don't improve, worsen or  new problems develop. The patient verbalized understanding.   Follow-up: Return if symptoms worsen or fail to improve.   Claiborne RiggZelda W Sameer Teeple, FNP-BC [facility] [place], [place] [phone]   02/02/2018, 9:51 PM

## 2018-02-02 ENCOUNTER — Encounter: Payer: Self-pay | Admitting: Nurse Practitioner

## 2018-02-04 ENCOUNTER — Ambulatory Visit (HOSPITAL_COMMUNITY)
Admission: RE | Admit: 2018-02-04 | Discharge: 2018-02-04 | Disposition: A | Discharge status: Home / Self Care | Payer: Self-pay | Source: Ambulatory Visit | Attending: Nurse Practitioner | Admitting: Nurse Practitioner

## 2018-02-04 DIAGNOSIS — M25561 Pain in right knee: Secondary | ICD-10-CM | POA: Insufficient documentation

## 2018-02-04 DIAGNOSIS — S83241A Other tear of medial meniscus, current injury, right knee, initial encounter: Secondary | ICD-10-CM | POA: Insufficient documentation

## 2018-02-04 DIAGNOSIS — G8929 Other chronic pain: Secondary | ICD-10-CM | POA: Insufficient documentation

## 2018-02-07 ENCOUNTER — Other Ambulatory Visit: Payer: Self-pay | Admitting: Nurse Practitioner

## 2018-02-07 ENCOUNTER — Telehealth: Payer: Self-pay

## 2018-02-07 DIAGNOSIS — S83241D Other tear of medial meniscus, current injury, right knee, subsequent encounter: Secondary | ICD-10-CM

## 2018-02-07 NOTE — Telephone Encounter (Signed)
-----   Message from Claiborne RiggZelda W Fleming, NP sent at 02/07/2018 10:21 AM EDT ----- You have a tear in the meniscus of your knee. This will need to be further evaluated by orthopedics. Will refer you and they will call to schedule.

## 2018-02-07 NOTE — Telephone Encounter (Signed)
CMA spoke to patient to inform on Xray.  Patient is aware of the referral.   Spanish interpreter Ignacia BayleyJulio [phone] assist with the call.   Patient verified DOB. Patient understood.

## 2018-02-20 ENCOUNTER — Encounter (INDEPENDENT_AMBULATORY_CARE_PROVIDER_SITE_OTHER): Payer: Self-pay | Admitting: Orthopedic Surgery

## 2018-02-20 ENCOUNTER — Ambulatory Visit (INDEPENDENT_AMBULATORY_CARE_PROVIDER_SITE_OTHER): Payer: Self-pay | Admitting: Orthopedic Surgery

## 2018-02-20 DIAGNOSIS — S838X1A Sprain of other specified parts of right knee, initial encounter: Secondary | ICD-10-CM

## 2018-02-20 MED ORDER — LIDOCAINE HCL 1 % IJ SOLN
5.0000 mL | INTRAMUSCULAR | Status: AC | PRN
  Administered 2018-02-20: 5 mL

## 2018-02-20 MED ORDER — METHYLPREDNISOLONE ACETATE 40 MG/ML IJ SUSP
40.0000 mg | INTRAMUSCULAR | Status: AC | PRN
  Administered 2018-02-20: 40 mg via INTRA_ARTICULAR

## 2018-02-20 MED ORDER — BUPIVACAINE HCL 0.25 % IJ SOLN
4.0000 mL | INTRAMUSCULAR | Status: AC | PRN
  Administered 2018-02-20: 4 mL via INTRA_ARTICULAR

## 2018-02-20 NOTE — Progress Notes (Addendum)
Office Visit Note   Patient: Sheri Mann           Date of Birth: 01/12/1966           MRN: [id number] Visit Date: 02/20/2018 Requested by: Claiborne Rigg, NP [address] [place], [place] [address] PCP: Claiborne Rigg, NP  Subjective: Chief Complaint  Patient presents with  . Right Knee - Injury    HPI: Patient presents for evaluation of right knee pain.  She has had pain in the right knee since January.  She denies any history of injury.  Localizes the pain discretely to the medial aspect of the knee but does not report any mechanical symptoms.  She reports decreased range of motion as well as stiffness and swelling.  She denies any other joint complaints.  MRI and radiographs are on the current system.  They are reviewed.  It shows horizontal tear of that medial meniscus.  She has used pills and lotions.  She is not working.  All of this clinic visit is done with the aid of an interpreter.              ROS: All systems reviewed are negative as they relate to the chief complaint within the history of present illness.  Patient denies  fevers or chills.   Assessment & Plan: Visit Diagnoses:  1. Injury of meniscus of right knee, initial encounter     Plan: Impression is right knee pain with medial meniscal tear in a 52 year old patient.  Plan is injection into the knee with 6-week return to decide for or against arthroscopic intervention.  Continue with conservative treatment measures.  Follow-Up Instructions: Return in about 6 weeks (around 04/03/2018).   Orders:  No orders of the defined types were placed in this encounter.  No orders of the defined types were placed in this encounter.     Procedures: Large Joint Inj: R knee on 02/20/2018 1:37 PM Indications: diagnostic evaluation, joint swelling and pain Details: 18 G 1.5 in needle, superolateral approach  Arthrogram: No  Medications: 5 mL lidocaine 1 %; 40 mg methylPREDNISolone acetate 40 MG/ML; 4 mL  bupivacaine 0.25 % Outcome: tolerated well, no immediate complications Procedure, treatment alternatives, risks and benefits explained, specific risks discussed. Consent was given by the patient. Immediately prior to procedure a time out was called to verify the correct patient, procedure, equipment, support staff and site/side marked as required. Patient was prepped and draped in the usual sterile fashion.       Clinical Data: No additional findings.  Objective: Vital Signs: LMP 11/11/2016 (Exact Date)   Physical Exam:   Constitutional: Patient appears well-developed HEENT:  Head: Normocephalic Eyes:EOM are normal Neck: Normal range of motion Cardiovascular: Normal rate Pulmonary/chest: Effort normal Neurologic: Patient is alert Skin: Skin is warm Psychiatric: Patient has normal mood and affect    Ortho Exam: Ortho exam demonstrates slightly antalgic gait to the right.  No effusion in the right knee.  There is medial joint line tenderness.  Pedal pulses are intact.  Extensor mechanism is intact.  Collateral and cruciate ligaments are stable.  Specialty Comments:  No specialty comments available.  Imaging: No results found.   PMFS History: Patient Active Problem List   Diagnosis Date Noted  . Language barrier 02/06/2017  . Post-menopausal bleeding 11/01/2016  . Thickened endometrium 11/01/2016  . Gastroesophageal reflux disease 10/20/2016  . Hypokalemia 10/20/2016  . Abdominal pain, epigastric 08/05/2015  . Perimenopausal vasomotor symptoms 01/28/2015  . Positive  D dimer 08/24/2014  . Left medial knee pain 08/20/2014  . Low back pain radiating to left leg 04/10/2014  . Allergic rhinitis 04/10/2014   Past Medical History:  Diagnosis Date  . High cholesterol 2013   . Hypertension   . Obesity (BMI 30-39.9)   . Urinary incontinence, mixed     Family History  Problem Relation Age of Onset  . Cancer Neg Hx   . Heart disease Neg Hx   . Diabetes Neg Hx   .  Hypertension Neg Hx   . Breast cancer Neg Hx     Past Surgical History:  Procedure Laterality Date  . BREAST EXCISIONAL BIOPSY Left   . BREAST LUMPECTOMY    . EYE SURGERY  2012  . TUBAL LIGATION  2000   Social History   Occupational History  . Occupation: Unemployed   Tobacco Use  . Smoking status: Never Smoker  . Smokeless tobacco: Never Used  Substance and Sexual Activity  . Alcohol use: No  . Drug use: No  . Sexual activity: Yes    Birth control/protection: Surgical

## 2018-04-01 ENCOUNTER — Ambulatory Visit (INDEPENDENT_AMBULATORY_CARE_PROVIDER_SITE_OTHER): Payer: Self-pay | Admitting: Orthopedic Surgery

## 2018-04-01 ENCOUNTER — Encounter (INDEPENDENT_AMBULATORY_CARE_PROVIDER_SITE_OTHER): Payer: Self-pay | Admitting: Orthopedic Surgery

## 2018-04-01 DIAGNOSIS — S838X1D Sprain of other specified parts of right knee, subsequent encounter: Secondary | ICD-10-CM

## 2018-04-01 NOTE — Progress Notes (Signed)
Office Visit Note   Patient: Sheri Mann           Date of Birth: Oct 11, 1965           MRN: [id number] Visit Date: 04/01/2018 Requested by: Claiborne Rigg, NP [address] [place], [place] [address] PCP: Claiborne Rigg, NP  Subjective: Chief Complaint  Patient presents with  . Follow-up    right knee    HPI: Patient presents follow-up of right knee.  She had an injection 02/20/2018.  All notes were reviewed.  The injection helped some.  Interpreter is here.  Still having some pain.  Taking some anti-inflammatories.  States that her symptoms are little bit worse when she sitting.  Denies any discrete mechanical symptoms where the knee is getting stuck with range of motion              ROS: All systems reviewed are negative as they relate to the chief complaint within the history of present illness.  Patient denies  fevers or chills.   Assessment & Plan: Visit Diagnoses: No diagnosis found.  Plan: Impression is right knee medial meniscal tear with no effusion or definite mechanical symptoms today.  I think this is something that if she were highly enthusiastic about having treated we could consider arthroscopic debridement but for now she wants to try anti-inflammatories.  We will see her back after Thanksgiving if her symptoms persist and we could consider another injection at that time.  Discussed arthroscopic intervention as well as the odds of improvement which would be a about 7030 or 8020 in her favor.  She will hold off on that intervention for now.  We will see her back as needed  Follow-Up Instructions: No follow-ups on file.   Orders:  No orders of the defined types were placed in this encounter.  No orders of the defined types were placed in this encounter.     Procedures: No procedures performed   Clinical Data: No additional findings.  Objective: Vital Signs: LMP 11/11/2016 (Exact Date)   Physical Exam:   Constitutional: Patient appears  well-developed HEENT:  Head: Normocephalic Eyes:EOM are normal Neck: Normal range of motion Cardiovascular: Normal rate Pulmonary/chest: Effort normal Neurologic: Patient is alert Skin: Skin is warm Psychiatric: Patient has normal mood and affect    Ortho Exam: Ortho exam demonstrates palpable pedal pulses.  No effusion in the right knee with good range of motion.  Patient does have some medial joint line tenderness.  Stable collateral crucial ligaments are present.  Specialty Comments:  No specialty comments available.  Imaging: No results found.   PMFS History: Patient Active Problem List   Diagnosis Date Noted  . Language barrier 02/06/2017  . Post-menopausal bleeding 11/01/2016  . Thickened endometrium 11/01/2016  . Gastroesophageal reflux disease 10/20/2016  . Hypokalemia 10/20/2016  . Abdominal pain, epigastric 08/05/2015  . Perimenopausal vasomotor symptoms 01/28/2015  . Positive D dimer 08/24/2014  . Left medial knee pain 08/20/2014  . Low back pain radiating to left leg 04/10/2014  . Allergic rhinitis 04/10/2014   Past Medical History:  Diagnosis Date  . High cholesterol 2013   . Hypertension   . Obesity (BMI 30-39.9)   . Urinary incontinence, mixed     Family History  Problem Relation Age of Onset  . Cancer Neg Hx   . Heart disease Neg Hx   . Diabetes Neg Hx   . Hypertension Neg Hx   . Breast cancer Neg Hx  Past Surgical History:  Procedure Laterality Date  . BREAST EXCISIONAL BIOPSY Left   . BREAST LUMPECTOMY    . EYE SURGERY  2012  . TUBAL LIGATION  2000   Social History   Occupational History  . Occupation: Unemployed   Tobacco Use  . Smoking status: Never Smoker  . Smokeless tobacco: Never Used  Substance and Sexual Activity  . Alcohol use: No  . Drug use: No  . Sexual activity: Yes    Birth control/protection: Surgical

## 2018-04-03 ENCOUNTER — Ambulatory Visit (INDEPENDENT_AMBULATORY_CARE_PROVIDER_SITE_OTHER): Payer: Self-pay | Admitting: Orthopedic Surgery

## 2018-04-10 ENCOUNTER — Ambulatory Visit: Payer: Self-pay | Attending: Family Medicine

## 2018-04-18 ENCOUNTER — Ambulatory Visit: Payer: Self-pay | Attending: Nurse Practitioner | Admitting: Physician Assistant

## 2018-04-18 VITALS — BP 122/89 | HR 81 | Temp 98.3°F | Resp 16 | Ht 60.0 in | Wt 174.4 lb

## 2018-04-18 DIAGNOSIS — Z79899 Other long term (current) drug therapy: Secondary | ICD-10-CM | POA: Insufficient documentation

## 2018-04-18 DIAGNOSIS — Z6830 Body mass index (BMI) 30.0-30.9, adult: Secondary | ICD-10-CM | POA: Insufficient documentation

## 2018-04-18 DIAGNOSIS — E669 Obesity, unspecified: Secondary | ICD-10-CM | POA: Insufficient documentation

## 2018-04-18 DIAGNOSIS — X58XXXD Exposure to other specified factors, subsequent encounter: Secondary | ICD-10-CM | POA: Insufficient documentation

## 2018-04-18 DIAGNOSIS — E7800 Pure hypercholesterolemia, unspecified: Secondary | ICD-10-CM | POA: Insufficient documentation

## 2018-04-18 DIAGNOSIS — I10 Essential (primary) hypertension: Secondary | ICD-10-CM | POA: Insufficient documentation

## 2018-04-18 DIAGNOSIS — S83241D Other tear of medial meniscus, current injury, right knee, subsequent encounter: Secondary | ICD-10-CM | POA: Insufficient documentation

## 2018-04-18 MED ORDER — METHOCARBAMOL 500 MG PO TABS: 500.0000 mg | ORAL_TABLET | Freq: Three times a day (TID) | ORAL | 0 refills | Status: DC | PRN

## 2018-04-18 MED ORDER — ACETAMINOPHEN-CODEINE #3 300-30 MG PO TABS: 1.0000 | ORAL_TABLET | Freq: Four times a day (QID) | ORAL | 0 refills | Status: DC | PRN

## 2018-04-18 NOTE — Patient Instructions (Addendum)
Go to your orthopedists office and see if they can work you in today or tomorrow.

## 2018-04-18 NOTE — Progress Notes (Signed)
Patient ID: Sheri Mann, female   DOB: [birth year], 52 y.o.   MRN: [id number]       Sheri Mann, is a 52 y.o. female  [id number]  [id number]  DOB - [birth year]  Subjective:  Chief Complaint and HPI: Sheri Mann is a 52 y.o. female here today Seen by Dr August Saucer 04/01/2018. She had R knee injection 02/20/2018 for meniscus injury.  May need arthroscopic debridement per ortho note.  Injection seemed to help for a while.  No fever.  Started hurting again 6 days ago.  No new injury.  She hasn't been to her orthopedist's office.  Walked in today for an appt.  OTC not helping.  [employer] interpreters Ileana translating.    ROS:   Constitutional:  No f/c, No night sweats, No unexplained weight loss. EENT:  No vision changes, No blurry vision, No hearing changes. No mouth, throat, or ear problems.  Respiratory: No cough, No SOB Cardiac: No CP, no palpitations GI:  No abd pain, No N/V/D. GU: No Urinary s/sx Musculoskeletal: R knee pain Neuro: No headache, no dizziness, no motor weakness.  Skin: No rash Endocrine:  No polydipsia. No polyuria.  Psych: Denies SI/HI  No problems updated.  ALLERGIES: No Known Allergies  PAST MEDICAL HISTORY: Past Medical History:  Diagnosis Date  . High cholesterol 2013   . Hypertension   . Obesity (BMI 30-39.9)   . Urinary incontinence, mixed     MEDICATIONS AT HOME: Prior to Admission medications   Medication Sig Start Date End Date Taking? Authorizing Provider  Calcium Carbonate-Vitamin D 600-400 MG-UNIT chew tablet Chew 1 tablet by mouth daily.   Yes [provider]  acetaminophen-codeine (TYLENOL #3) 300-30 MG tablet Take 1 tablet by mouth every 6 (six) hours as needed for moderate pain. 04/18/18   Anders Simmonds, PA-C  fluticasone (FLONASE) 50 MCG/ACT nasal spray Place 2 sprays into both nostrils daily. Patient not taking: Reported on 04/18/2018 01/29/18   Claiborne Rigg, NP  methocarbamol (ROBAXIN) 500 MG tablet  Take 1 tablet (500 mg total) by mouth every 8 (eight) hours as needed for muscle spasms. 04/18/18   Anders Simmonds, PA-C  Multiple Vitamin (MULTIVITAMIN) capsule Take 1 capsule by mouth daily.    [provider]  naproxen (NAPROSYN) 500 MG tablet Take 1 tablet (500 mg total) by mouth 2 (two) times daily with a meal. Patient not taking: Reported on 04/18/2018 12/14/17   Claiborne Rigg, NP  Omega-3 300 MG CAPS Take by mouth.    [provider]     Objective:  EXAM:   Vitals:   04/18/18 1407  BP: 122/89  Pulse: 81  Resp: 16  Temp: 98.3 F (36.8 C)  TempSrc: Oral  SpO2: 97%  Weight: 174 lb 6.4 oz (79.1 kg)  Height: 5' (1.524 m)    General appearance : A&OX3. NAD. Non-toxic-appearing, walking with limp favoring L HEENT: Atraumatic and Normocephalic.  PERRLA. EOM intact.  Neck: supple, no JVD. No cervical lymphadenopathy. No thyromegaly Chest/Lungs:  Breathing-non-labored, Good air entry bilaterally, breath sounds normal without rales, rhonchi, or wheezing  CVS: S1 S2 regular, no murmurs, gallops, rubs  R knee-no obvious effusion or ballotment.  No erythema over joint.  Even light palpation medially is exquisitely tender.  Unable to perform adequate exam secondary to pain.  Extremities: Bilateral Lower Ext shows no edema, both legs are warm to touch with = pulse throughout Neurology:  CN II-XII grossly intact, Non focal.   Psych:  TP linear.  J/I WNL. Normal speech. Appropriate eye contact and affect.  Skin:  No Rash  Data Review Lab Results  Component Value Date   HGBA1C 5.90 01/28/2015     Assessment & Plan   1. Acute medial meniscus tear of right knee, subsequent encounter Take naprosyn.  Go to ortho office and see if they can work you in today or tomorrow.  Can also try methocarbamol. - acetaminophen-codeine (TYLENOL #3) 300-30 MG tablet; Take 1 tablet by mouth every 6 (six) hours as needed for moderate pain.  Dispense: 30 tablet; Refill:  0   Patient have been counseled extensively about nutrition and exercise  Return if symptoms worsen or fail to improve.  The patient was given clear instructions to go to ER or return to medical center if symptoms don't improve, worsen or new problems develop. The patient verbalized understanding. The patient was told to call to get lab results if they haven't heard anything in the next week.     Georgian Co, PA-C [facility] [place], [place] [phone]   04/18/2018, 2:27 PM

## 2018-06-26 ENCOUNTER — Ambulatory Visit: Payer: Self-pay | Attending: Family Medicine | Admitting: Physician Assistant

## 2018-06-26 VITALS — BP 134/83 | HR 67 | Temp 97.6°F | Resp 16 | Wt 174.0 lb

## 2018-06-26 DIAGNOSIS — Z791 Long term (current) use of non-steroidal anti-inflammatories (NSAID): Secondary | ICD-10-CM | POA: Insufficient documentation

## 2018-06-26 DIAGNOSIS — J320 Chronic maxillary sinusitis: Secondary | ICD-10-CM | POA: Insufficient documentation

## 2018-06-26 DIAGNOSIS — I10 Essential (primary) hypertension: Secondary | ICD-10-CM | POA: Insufficient documentation

## 2018-06-26 DIAGNOSIS — Z789 Other specified health status: Secondary | ICD-10-CM

## 2018-06-26 DIAGNOSIS — R0981 Nasal congestion: Secondary | ICD-10-CM | POA: Insufficient documentation

## 2018-06-26 MED ORDER — AMOXICILLIN 500 MG PO CAPS: 500.0000 mg | ORAL_CAPSULE | Freq: Three times a day (TID) | ORAL | 0 refills | Status: DC

## 2018-06-26 MED ORDER — FLUCONAZOLE 150 MG PO TABS: 150.0000 mg | ORAL_TABLET | Freq: Once | ORAL | 0 refills | Status: AC

## 2018-06-26 MED ORDER — FLUTICASONE PROPIONATE 50 MCG/ACT NA SUSP: 2.0000 | Freq: Every day | NASAL | 6 refills | Status: DC

## 2018-06-26 NOTE — Progress Notes (Signed)
Patient ID: Sheri Mann, female   DOB: [birth year], 53 y.o.   MRN: [id number]      Sheri Mann, is a 53 y.o. female  [id number]  [id number]  DOB - [birth year]  Subjective:  Chief Complaint and HPI: Sheri Mann is a [age] y.o. female here todaywith over 1 week h/o sinus pain and pressure and blowing some blood and discolored mucus out of R nostril.  Previous sinus surgery in 2011.  + sinus pressure and pain with sinus HA.  No fever.  No cough.  No ST.  No GI s/sx.  Not using flonase  Claudia with [employer] interpreters translating.    ROS:   Constitutional:  No f/c, No night sweats, No unexplained weight loss. EENT:  No vision changes, No blurry vision, No hearing changes. Respiratory: No cough, No SOB Cardiac: No CP, no palpitations GI:  No abd pain, No N/V/D. GU: No Urinary s/sx Musculoskeletal: No joint pain Neuro: + headache, no dizziness, no motor weakness.  Skin: No rash Endocrine:  No polydipsia. No polyuria.  Psych: Denies SI/HI  No problems updated.  ALLERGIES: No Known Allergies  PAST MEDICAL HISTORY: Past Medical History:  Diagnosis Date  . High cholesterol 2013   . Hypertension   . Obesity (BMI 30-39.9)   . Urinary incontinence, mixed     MEDICATIONS AT HOME: Prior to Admission medications   Medication Sig Start Date End Date Taking? Authorizing Provider  amoxicillin (AMOXIL) 500 MG capsule Take 1 capsule (500 mg total) by mouth 3 (three) times daily. 06/26/18   Anders SimmondsMcClung, Koji Niehoff M, PA-C  Calcium Carbonate-Vitamin D 600-400 MG-UNIT chew tablet Chew 1 tablet by mouth daily.    [provider]  fluconazole (DIFLUCAN) 150 MG tablet Take 1 tablet (150 mg total) by mouth once for 1 dose. 06/26/18 06/26/18  Anders SimmondsMcClung, Eryck Negron M, PA-C  fluticasone (FLONASE) 50 MCG/ACT nasal spray Place 2 sprays into both nostrils daily. 06/26/18   Anders SimmondsMcClung, Dajion Bickford M, PA-C  methocarbamol (ROBAXIN) 500 MG tablet Take 1 tablet (500 mg total) by mouth every 8 (eight)  hours as needed for muscle spasms. 04/18/18   Anders SimmondsMcClung, Yukie Bergeron M, PA-C  Multiple Vitamin (MULTIVITAMIN) capsule Take 1 capsule by mouth daily.    [provider]  naproxen (NAPROSYN) 500 MG tablet Take 1 tablet (500 mg total) by mouth 2 (two) times daily with a meal. Patient not taking: Reported on 04/18/2018 12/14/17   Claiborne RiggFleming, Zelda W, NP  Omega-3 300 MG CAPS Take by mouth.    [provider]     Objective:  EXAM:   Vitals:   06/26/18 1628  BP: 134/83  Pulse: 67  Resp: 16  Temp: 97.6 F (36.4 C)  TempSrc: Oral  SpO2: 100%  Weight: 174 lb (78.9 kg)    General appearance : A&OX3. NAD. Non-toxic-appearing HEENT: Atraumatic and Normocephalic.  PERRLA. EOM intact.  TM full B. No TM erythema.  Nostrils with inflamed turbinates.  + maxillary TTP Mouth-MMM, post pharynx WNL w/o erythema, No PND. Neck: supple, no JVD. No cervical lymphadenopathy. No thyromegaly Chest/Lungs:  Breathing-non-labored, Good air entry bilaterally, breath sounds normal without rales, rhonchi, or wheezing  CVS: S1 S2 regular, no murmurs, gallops, rubs  Neurology:  CN II-XII grossly intact, Non focal.   Psych:  TP linear. J/I WNL. Normal speech. Appropriate eye contact and affect.  Skin:  No Rash  Data Review Lab Results  Component Value Date   HGBA1C 5.90 01/28/2015     Assessment & Plan   1.  Maxillary sinusitis, unspecified chronicity Amoxicillin 500mg  tid X 10 days.  Diflucan sent if needed.  Cold air humidifier.    2. Nasal congestion - fluticasone (FLONASE) 50 MCG/ACT nasal spray; Place 2 sprays into both nostrils daily.  Dispense: 16 g; Refill: 6  3. Language barrier [employer] interpreters used and additional time performing visit was required.   Patient have been counseled extensively about nutrition and exercise  Return if symptoms worsen or fail to improve.  The patient was given clear instructions to go to ER or return to medical center if symptoms don't improve, worsen  or new problems develop. The patient verbalized understanding. The patient was told to call to get lab results if they haven't heard anything in the next week.     Georgian Co, PA-C [facility] [place], [place] [phone]   06/26/2018, 4:36 PM

## 2018-06-26 NOTE — Patient Instructions (Signed)
Tylenol sinus for sinus Headaches.    Get a cold air humidifier to run in the winter

## 2018-06-28 ENCOUNTER — Ambulatory Visit: Payer: Self-pay | Attending: Family Medicine

## 2018-11-19 ENCOUNTER — Ambulatory Visit: Payer: Self-pay | Attending: Internal Medicine | Admitting: Internal Medicine

## 2018-11-19 ENCOUNTER — Other Ambulatory Visit: Payer: Self-pay | Admitting: Nurse Practitioner

## 2018-11-19 DIAGNOSIS — M549 Dorsalgia, unspecified: Secondary | ICD-10-CM

## 2018-11-19 MED ORDER — NAPROXEN 500 MG PO TABS: 500.0000 mg | ORAL_TABLET | Freq: Two times a day (BID) | ORAL | 0 refills | Status: DC | PRN

## 2018-11-19 NOTE — Progress Notes (Signed)
Virtual Visit via Telephone Note Due to current restrictions/limitations of in-office visits due to the COVID-19 pandemic, this scheduled clinical appointment was converted to a telehealth visit  I connected with Sheri Mann on 11/19/18 at 11:41 a.m EDT by telephone and verified that I am speaking with the correct person using two identifiers. I am in my office.  The patient is at home.  Only the patient, myself and interpreter  Graylin Shiver from [employer] [phone]) participated in this encounter.  I discussed the limitations, risks, security and privacy concerns of performing an evaluation and management service by telephone and the availability of in person appointments. I also discussed with the patient that there may be a patient responsible charge related to this service. The patient expressed understanding and agreed to proceed.  History of Present Illness: Pt with hx of GERD, LBP.  Pt of NP [place].  This was an UC visit for back pain   Patient was a very difficult historian due to language barrier.   Pt c/o having a burning sensation in LT side of her back from neck down to lower back  x 8 days.  Initially pt stated that pain radiates to LT leg then she said it does not, it goes to her bladder.  No dysuria or abdominal pain.  No initiating factors.   -took some abx 6 days ago for 2 days called Tetrayxyl  (from the [employer]panic store) because she felt it was a UTI.  Also took some Benadryl and ASA. -pain is a little better.  Better with laying down but returns when she stands up.  No feeling of burning or weakness in arms/legs.  No fever  Observations/Objective: No direct observation done   Assessment and Plan: 1. Acute left-sided back pain, unspecified back location -Evaluation is made difficult due to the language barrier.  Therefore I have requested a follow-up appointment for her to see her PCP in about 1 week for reevaluation in person.  In the meantime I have given some  Naprosyn to use as needed - naproxen (NAPROSYN) 500 MG tablet; Take 1 tablet (500 mg total) by mouth 2 (two) times daily as needed.  Dispense: 40 tablet; Refill: 0   Follow Up Instructions: 1 wk with PCP   I discussed the assessment and treatment plan with the patient. The patient was provided an opportunity to ask questions and all were answered. The patient agreed with the plan and demonstrated an understanding of the instructions.   The patient was advised to call back or seek an in-person evaluation if the symptoms worsen or if the condition fails to improve as anticipated.  I provided 23 minutes of non-face-to-face time during this encounter.   Jonah Blue, MD

## 2018-11-19 NOTE — Progress Notes (Signed)
Pt states the pain radiates down to her left leg and she feels a burning sensation

## 2018-11-24 ENCOUNTER — Other Ambulatory Visit: Payer: Self-pay

## 2018-11-24 ENCOUNTER — Emergency Department (HOSPITAL_COMMUNITY)
Admission: EM | Admit: 2018-11-24 | Discharge: 2018-11-25 | Disposition: A | Discharge status: Home / Self Care | Payer: HRSA Program | Attending: Emergency Medicine | Admitting: Emergency Medicine

## 2018-11-24 ENCOUNTER — Encounter (HOSPITAL_COMMUNITY): Payer: Self-pay

## 2018-11-24 ENCOUNTER — Emergency Department (HOSPITAL_COMMUNITY): Payer: HRSA Program

## 2018-11-24 DIAGNOSIS — R945 Abnormal results of liver function studies: Secondary | ICD-10-CM | POA: Diagnosis not present

## 2018-11-24 DIAGNOSIS — U071 COVID-19: Secondary | ICD-10-CM | POA: Diagnosis not present

## 2018-11-24 DIAGNOSIS — Z79899 Other long term (current) drug therapy: Secondary | ICD-10-CM | POA: Diagnosis not present

## 2018-11-24 DIAGNOSIS — I10 Essential (primary) hypertension: Secondary | ICD-10-CM | POA: Insufficient documentation

## 2018-11-24 DIAGNOSIS — R109 Unspecified abdominal pain: Secondary | ICD-10-CM

## 2018-11-24 DIAGNOSIS — R7989 Other specified abnormal findings of blood chemistry: Secondary | ICD-10-CM

## 2018-11-24 LAB — LIPASE, BLOOD: Lipase: 22 U/L (ref 11–51)

## 2018-11-24 LAB — COMPREHENSIVE METABOLIC PANEL
ALT: 49 U/L — ABNORMAL HIGH (ref 0–44)
AST: 46 U/L — ABNORMAL HIGH (ref 15–41)
Albumin: 3.5 g/dL (ref 3.5–5.0)
Alkaline Phosphatase: 172 U/L — ABNORMAL HIGH (ref 38–126)
Anion gap: 11 (ref 5–15)
BUN: 8 mg/dL (ref 6–20)
CO2: 23 mmol/L (ref 22–32)
Calcium: 9.6 mg/dL (ref 8.9–10.3)
Chloride: 106 mmol/L (ref 98–111)
Creatinine, Ser: 0.58 mg/dL (ref 0.44–1.00)
GFR calc Af Amer: >60 mL/min (ref >60)
GFR calc non Af Amer: >60 mL/min (ref >60)
Glucose, Bld: 155 mg/dL — ABNORMAL HIGH (ref 70–99)
Potassium: 3.5 mmol/L (ref 3.5–5.1)
Sodium: 140 mmol/L (ref 135–145)
Total Bilirubin: 0.4 mg/dL (ref 0.3–1.2)
Total Protein: 7.5 g/dL (ref 6.5–8.1)

## 2018-11-24 LAB — CBC
HCT: 38.6 % (ref 36.0–46.0)
Hemoglobin: 12.7 g/dL (ref 12.0–15.0)
MCH: 28.7 pg (ref 26.0–34.0)
MCHC: 32.9 g/dL (ref 30.0–36.0)
MCV: 87.1 fL (ref 80.0–100.0)
Platelets: 177 10*3/uL (ref 150–400)
RBC: 4.43 MIL/uL (ref 3.87–5.11)
RDW: 11.8 % (ref 11.5–15.5)
WBC: 5.7 10*3/uL (ref 4.0–10.5)
nRBC: 0 % (ref 0.0–0.2)

## 2018-11-24 LAB — URINALYSIS, ROUTINE W REFLEX MICROSCOPIC
Bacteria, UA: NONE SEEN
Bilirubin Urine: NEGATIVE
Glucose, UA: NEGATIVE mg/dL
Ketones, ur: NEGATIVE mg/dL
Leukocytes,Ua: NEGATIVE
Nitrite: NEGATIVE
Protein, ur: NEGATIVE mg/dL
Specific Gravity, Urine: 1.001 — ABNORMAL LOW (ref 1.005–1.030)
pH: 6 (ref 5.0–8.0)

## 2018-11-24 LAB — I-STAT BETA HCG BLOOD, ED (MC, WL, AP ONLY): I-stat hCG, quantitative: <5 m[IU]/mL (ref <5)

## 2018-11-24 LAB — TROPONIN I: Troponin I: <0.03 ng/mL (ref <0.03)

## 2018-11-24 MED ORDER — SODIUM CHLORIDE 0.9% FLUSH: 3.0000 mL | Freq: Once | INTRAVENOUS | Status: DC

## 2018-11-24 NOTE — ED Triage Notes (Signed)
Pt reports chest pressure, back pain, abd pain with n.v. for 1 week. Subjective fever about 2 weeks ago. No vomiting today. Pt a.o, nad noted.

## 2018-11-25 ENCOUNTER — Emergency Department (HOSPITAL_COMMUNITY): Payer: HRSA Program

## 2018-11-25 LAB — TROPONIN I: Troponin I: <0.03 ng/mL (ref <0.03)

## 2018-11-25 LAB — SARS CORONAVIRUS 2 BY RT PCR (HOSPITAL ORDER, PERFORMED IN ~~LOC~~ HOSPITAL LAB): SARS Coronavirus 2: POSITIVE — AB

## 2018-11-25 MED ORDER — ONDANSETRON HCL 4 MG/2ML IJ SOLN
4.0000 mg | Freq: Once | INTRAMUSCULAR | Status: AC
  Administered 2018-11-25: 4 mg via INTRAVENOUS
  Filled 2018-11-25: qty 2

## 2018-11-25 MED ORDER — SODIUM CHLORIDE 0.9 % IV BOLUS
1000.0000 mL | Freq: Once | INTRAVENOUS | Status: AC
  Administered 2018-11-25: 1000 mL via INTRAVENOUS

## 2018-11-25 MED ORDER — IOHEXOL 300 MG/ML  SOLN
100.0000 mL | Freq: Once | INTRAMUSCULAR | Status: AC | PRN
  Administered 2018-11-25: 100 mL via INTRAVENOUS

## 2018-11-25 MED ORDER — MORPHINE SULFATE (PF) 4 MG/ML IV SOLN
4.0000 mg | Freq: Once | INTRAVENOUS | Status: AC
  Administered 2018-11-25: 4 mg via INTRAVENOUS
  Filled 2018-11-25: qty 1

## 2018-11-25 NOTE — Discharge Instructions (Addendum)
Sus anlisis de sangre mostraron inflamacin leve en el hgado, pero la tomografa computarizada y la ecografa mostraron que el hgado estaba bien.  Tuviste una prueba de coronavirus con un resultado positivo. Coronavirus is probably what is causing your problem today.  Necesita ponerse en [employer]  Beber mucho lquido. Tome paracetamol o ibuprofeno segn sea necesario para el dolor o la fiebre.  Si desarrolla dificultad para respirar o confusin, regrese al departamento de emergencias para una evaluacin adicional.

## 2018-11-25 NOTE — ED Provider Notes (Signed)
[facility] Provider Note   CSN: [id number] Arrival date & time: 11/24/18  1951    History   Chief Complaint Chief Complaint  Patient presents with   Chest Pain   Back Pain   Abdominal Pain    HPI Sheri Mann is a 53 y.o. female.    The history is provided by the patient.  Chest Pain  Associated symptoms: abdominal pain and back pain   Back Pain  Associated symptoms: abdominal pain and chest pain   Abdominal Pain  Associated symptoms: chest pain   She has history of hypertension and hyperlipidemia and comes in complaining of chest pain, abdominal pain, back pain for the last 2 weeks.  Pain is burning and intermittent.  There is associated anorexia.  She has had nausea with occasional vomiting.  She denies diarrhea.  About 2 weeks ago, she did have a feeling like her body was hot but did not check her temperature.  She has not had any further fevers or chills or sweats.  She denies difficulty urinating.  She denies any constipation or diarrhea.  She took ibuprofen without relief.  There is associated anorexia.  Past Medical History:  Diagnosis Date   High cholesterol 2013    Hypertension    Obesity (BMI 30-39.9)    Urinary incontinence, mixed     Patient Active Problem List   Diagnosis Date Noted   Language barrier 02/06/2017   Post-menopausal bleeding 11/01/2016   Thickened endometrium 11/01/2016   Gastroesophageal reflux disease 10/20/2016   Hypokalemia 10/20/2016   Abdominal pain, epigastric 08/05/2015   Perimenopausal vasomotor symptoms 01/28/2015   Positive D dimer 08/24/2014   Left medial knee pain 08/20/2014   Low back pain radiating to left leg 04/10/2014   Allergic rhinitis 04/10/2014    Past Surgical History:  Procedure Laterality Date   BREAST EXCISIONAL BIOPSY Left    BREAST LUMPECTOMY     EYE SURGERY  2012   TUBAL LIGATION  2000     OB History    Gravida  5   Para  5     Term  5   Preterm      AB      Living  5     SAB      TAB      Ectopic      Multiple      Live Births  5        Obstetric Comments  SVD x 5         Home Medications    Prior to Admission medications   Medication Sig Start Date End Date Taking? Authorizing Provider  Calcium Carbonate-Vitamin D 600-400 MG-UNIT chew tablet Chew 1 tablet by mouth daily.    [provider]  fluticasone (FLONASE) 50 MCG/ACT nasal spray Place 2 sprays into both nostrils daily. 06/26/18   Anders SimmondsMcClung, Angela M, PA-C  methocarbamol (ROBAXIN) 500 MG tablet Take 1 tablet (500 mg total) by mouth every 8 (eight) hours as needed for muscle spasms. Patient not taking: Reported on 11/19/2018 04/18/18   Anders SimmondsMcClung, Angela M, PA-C  Multiple Vitamin (MULTIVITAMIN) capsule Take 1 capsule by mouth daily.    [provider]  naproxen (NAPROSYN) 500 MG tablet Take 1 tablet (500 mg total) by mouth 2 (two) times daily as needed. 11/19/18   Marcine MatarJohnson, Deborah B, MD  Omega-3 300 MG CAPS Take by mouth.    [provider]    Family History Family History  Problem Relation Age of Onset   Cancer Neg Hx    Heart disease Neg Hx    Diabetes Neg Hx    Hypertension Neg Hx    Breast cancer Neg Hx     Social History Social History   Tobacco Use   Smoking status: Never Smoker   Smokeless tobacco: Never Used  Substance Use Topics   Alcohol use: No   Drug use: No     Allergies   Patient has no known allergies.   Review of Systems Review of Systems  Cardiovascular: Positive for chest pain.  Gastrointestinal: Positive for abdominal pain.  Musculoskeletal: Positive for back pain.  All other systems reviewed and are negative.    Physical Exam Updated Vital Signs BP (!) 142/91 (BP Location: Left Arm)    Pulse 75    Temp 98.5 F (36.9 C) (Oral)    Resp 18    LMP 11/11/2016 (Exact Date)    SpO2 99%   Physical Exam Vitals signs and nursing note reviewed.    53 year old  female, resting comfortably and in no acute distress. Vital signs are significant for mildly elevated blood pressure. Oxygen saturation is 99%, which is normal. Head is normocephalic and atraumatic. PERRLA, EOMI. Oropharynx is clear. Neck is nontender and supple without adenopathy or JVD. Back is nontender and there is no CVA tenderness. Lungs are clear without rales, wheezes, or rhonchi. Chest is mildly tender diffusely.  There is no crepitus. Heart has regular rate and rhythm without murmur. Abdomen is soft, flat, with moderate tenderness diffusely.  There is no rebound or guarding.  There are no masses or hepatosplenomegaly and peristalsis is normoactive. Extremities have no cyanosis or edema, full range of motion is present. Skin is warm and dry without rash. Neurologic: Mental status is normal, cranial nerves are intact, there are no motor or sensory deficits.  ED Treatments / Results  Labs (all labs ordered are listed, but only abnormal results are displayed) Labs Reviewed  SARS CORONAVIRUS 2 (HOSPITAL ORDER, PERFORMED IN [facility]) - Abnormal; Notable for the following components:      Result Value   SARS Coronavirus 2 POSITIVE (*)    All other components within normal limits  COMPREHENSIVE METABOLIC PANEL - Abnormal; Notable for the following components:   Glucose, Bld 155 (*)    AST 46 (*)    ALT 49 (*)    Alkaline Phosphatase 172 (*)    All other components within normal limits  URINALYSIS, ROUTINE W REFLEX MICROSCOPIC - Abnormal; Notable for the following components:   Color, Urine COLORLESS (*)    Specific Gravity, Urine 1.001 (*)    Hgb urine dipstick SMALL (*)    All other components within normal limits  LIPASE, BLOOD  CBC  TROPONIN I  TROPONIN I  I-STAT BETA HCG BLOOD, ED ([facility], [facility], [facility] ONLY)    EKG EKG Interpretation  Date/Time:  Sunday November 24 2018 20:06:01 EDT Ventricular Rate:  95 PR Interval:  148 QRS Duration: 86 QT Interval:  348 QTC  Calculation: 437 R Axis:   -107 Text Interpretation:  Normal sinus rhythm Right superior axis deviation T wave abnormality, consider lateral ischemia Abnormal ECG No old tracing to compare Confirmed by Dione BoozeGlick, Lenn Volker ([id number]) on 11/24/2018 11:02:55 PM   Radiology Dg Chest 2 View  Result Date: 11/24/2018 CLINICAL DATA:  Initial evaluation for acute chest pain. EXAM: CHEST - 2 VIEW COMPARISON:  Prior radiograph 10/21/2014. FINDINGS: Transverse heart size at  the upper limits of normal. Mediastinal silhouette within normal limits. Lungs normally inflated. Irregular serpiginous densities overlying the peripheral right upper lung most consistent with scarring. Mild bibasilar subsegmental atelectasis noted. No focal infiltrates. No pulmonary edema or pleural effusion. No pneumothorax. No acute osseous finding. IMPRESSION: Mild bibasilar subsegmental atelectasis. No other active cardiopulmonary disease. Electronically Signed   By: Jeannine Boga M.D.   On: 11/24/2018 20:59   Ct Abdomen Pelvis W Contrast  Result Date: 11/25/2018 CLINICAL DATA:  53 year old female with chest pressure, back pain, abdominal pain and nausea vomiting for 1 week. EXAM: CT ABDOMEN AND PELVIS WITH CONTRAST TECHNIQUE: Multidetector CT imaging of the abdomen and pelvis was performed using the standard protocol following bolus administration of intravenous contrast. CONTRAST:  189mL OMNIPAQUE IOHEXOL 300 MG/ML  SOLN COMPARISON:  CT Abdomen and Pelvis 09/19/2016. FINDINGS: Lower chest: Peripheral patchy and indistinct opacity throughout the visible middle lobes and right lower lobe is largely new since 2018. There was mild right lower lobe scarring previously. No pleural or pericardial effusion. Hepatobiliary: Negative liver and gallbladder. Pancreas: Stable, negative. Spleen: Negative. Adrenals/Urinary Tract: Normal adrenal glands. Chronic mildly lobulated appearance of the right kidney. Bilateral renal enhancement and contrast excretion is  symmetric and within normal limits. No nephrolithiasis is evident. Both proximal ureters are decompressed and normal. Unremarkable urinary bladder. Stomach/Bowel: Mild chronic diverticulosis at the junction of the descending and sigmoid colon. Questionable generalized mild colonic wall thickening. No large bowel mesenteric stranding. Diminutive and normal appendix. Negative terminal ileum. No dilated small bowel. Decompressed stomach and duodenum. No free air, free fluid. Vascular/Lymphatic: Major arterial structures are patent and normal. Portal venous system is patent. No lymphadenopathy. Reproductive: Negative. Other: No pelvic free fluid. Musculoskeletal: No acute osseous abnormality identified. IMPRESSION: 1. Abnormal lung bases with bilateral peripheral patchy and indistinct opacity, suspicious for acute viral/atypical respiratory infection. COVID-19 testing may be valuable. 2. Difficult to exclude mild acute colitis, with questionable generalized large bowel wall thickening. 3. No other acute or inflammatory process identified in the abdomen or pelvis. Electronically Signed   By: Genevie Ann M.D.   On: 11/25/2018 02:46   US Abdomen Limited  Result Date: 11/25/2018 CLINICAL DATA:  53 year old female with abnormal LFTs and right upper quadrant pain. EXAM: ULTRASOUND ABDOMEN LIMITED RIGHT UPPER QUADRANT COMPARISON:  CT Abdomen and Pelvis 09/19/2016. FINDINGS: Gallbladder: No gallstones or wall thickening visualized. No sonographic Murphy sign noted by sonographer. Common bile duct: Diameter: 3 millimeters, normal. Liver: Liver echogenicity appears at the upper limits of normal on image 26. No discrete liver lesion. No intrahepatic biliary ductal dilatation. Portal vein is patent on color Doppler imaging with normal direction of blood flow towards the liver. Other findings: Negative visible right kidney. IMPRESSION: Negative right upper quadrant ultrasound. Electronically Signed   By: Genevie Ann M.D.   On: 11/25/2018  01:42    Procedures Procedures  Medications Ordered in ED Medications  sodium chloride flush (NS) 0.9 % injection 3 mL (has no administration in time range)  sodium chloride 0.9 % bolus 1,000 mL (has no administration in time range)  morphine 4 MG/ML injection 4 mg (has no administration in time range)  ondansetron (ZOFRAN) injection 4 mg (has no administration in time range)     Initial Impression / Assessment and Plan / ED Course  I have reviewed the triage vital signs and the nursing notes.  Pertinent labs & imaging results that were available during my care of the patient were reviewed by me and considered  in my medical decision making (see chart for details).  Chest, abdominal, back pain of uncertain cause.  Screening labs had been obtained and she does have mild elevation of transaminases and mild to moderate elevation of alkaline phosphatase but normal WBC and hemoglobin.  Urinalysis is unremarkable.  On review of old records, 1 year ago she had a very mild elevation of alkaline phosphatase.  I am concerned about possibility of cholelithiasis with biliary colic.  However, with generalized pain, will send for CT of abdomen and pelvis and also right upper quadrant ultrasound.  ECG shows mild repolarization changes.  Initial troponin is negative, will get delta troponin.  Chest x-ray is unremarkable.  She will be given IV fluids, morphine, ondansetron.  Further review of past records do show ED visits for GERD and pyelonephritis.  Urine today is unremarkable.  CT showed no acute intra-abdominal process, but findings in the lung suggestive of COVID-19.  Ultrasound was unremarkable.  COVID-19 test has come back positive.  It is possible that her symptoms are related to COVID-19.  She is advised of these findings and told to continue symptomatic treatment at home.  Return for difficulty breathing or confusion or other new symptoms.  Patient expressed understanding.  Hartley Jacki ConesMarcial Mann  was evaluated in Emergency Department on 11/25/2018 for the symptoms described in the history of present illness. She was evaluated in the context of the global COVID-19 pandemic, which necessitated consideration that the patient might be at risk for infection with the SARS-CoV-2 virus that causes COVID-19. Institutional protocols and algorithms that pertain to the evaluation of patients at risk for COVID-19 are in a state of rapid change based on information released by regulatory bodies including the CDC and federal and state organizations. These policies and algorithms were followed during the patient's care in the ED.  Final Clinical Impressions(s) / ED Diagnoses   Final diagnoses:  Abdominal pain, unspecified abdominal location  Elevated liver function tests  COVID-19 virus detected    ED Discharge Orders    None       Dione BoozeGlick, Hazyl Marseille, MD 11/25/18 [phone]

## 2018-12-13 ENCOUNTER — Ambulatory Visit: Payer: Self-pay | Attending: Nurse Practitioner | Admitting: Nurse Practitioner

## 2018-12-13 ENCOUNTER — Encounter: Payer: Self-pay | Admitting: Nurse Practitioner

## 2018-12-13 ENCOUNTER — Other Ambulatory Visit: Payer: Self-pay

## 2018-12-13 DIAGNOSIS — U071 COVID-19: Secondary | ICD-10-CM

## 2018-12-13 DIAGNOSIS — K219 Gastro-esophageal reflux disease without esophagitis: Secondary | ICD-10-CM

## 2018-12-13 MED ORDER — OMEPRAZOLE 20 MG PO CPDR: 20.0000 mg | DELAYED_RELEASE_CAPSULE | Freq: Every day | ORAL | 3 refills | Status: DC

## 2018-12-13 NOTE — Progress Notes (Signed)
Virtual Visit via Telephone Note Due to national recommendations of social distancing due to COVID 19, telehealth visit is felt to be most appropriate for this patient at this time.  I discussed the limitations, risks, security and privacy concerns of performing an evaluation and management service by telephone and the availability of in person appointments. I also discussed with the patient that there may be a patient responsible charge related to this service. The patient expressed understanding and agreed to proceed.    I connected with Sheri SplinterAntonieta Marcial Mann on 12/13/18  at   9:30 AM EDT   by telephone and verified that I am speaking with the correct person using two identifiers.   Consent I discussed the limitations, risks, security and privacy concerns of performing an evaluation and management service by telephone and the availability of in person appointments. I also discussed with the patient that there may be a patient responsible charge related to this service. The patient expressed understanding and agreed to proceed.   Location of Patient: Private Residence   [facility]    Persons participating in Telemedicine visit: Bertram DenverZelda Harlym Gehling FNP-BC [place] CMA Zarin Jacki ConesMarcial Mann   Spanish Interpreter [place] #[id number]   History of Present Illness: Telemedicine visit for: ED F/U; COVID; abdominal pain  GERD When she eats. She feels a burning sensation in her stomach. Feels like acid in her throat. She does have a chronic history of GERD based on her chart review as she was on Zantac. Other symptoms include:  bilious reflux, chest pain, deep pressure at base of neck, dysphagia, heartburn, midespigastric pain, nocturnal burning and regurgitation of undigested food and upper abdominal discomfort.  She denies choking on food, hematemesis, melena, regurgitation of undigested food, shortness of breath and wheezing.  She denies dysphagia.   She has not lost weight. She denies melena, hematochezia, hematemesis, and coffee ground emesis. Medical therapy in the past has included H2 antagonists.  COVID-19  Patient asymptomatic. Has no complaints aside from abdominal pain from GERD. She denies fever, headache, diarrhea, cough, shortness of breath.    Past Medical History:  Diagnosis Date  . High cholesterol 2013   . Hypertension   . Obesity (BMI 30-39.9)   . Urinary incontinence, mixed     Past Surgical History:  Procedure Laterality Date  . BREAST EXCISIONAL BIOPSY Left   . BREAST LUMPECTOMY    . EYE SURGERY  2012  . TUBAL LIGATION  2000    Family History  Problem Relation Age of Onset  . Cancer Neg Hx   . Heart disease Neg Hx   . Diabetes Neg Hx   . Hypertension Neg Hx   . Breast cancer Neg Hx     Social History   Socioeconomic History  . Marital status: Married    Spouse name: Not on file  . Number of children: 5   . Years of education: 2   . Highest education level: Not on file  Occupational History  . Occupation: Unemployed   Social Needs  . Financial resource strain: Not on file  . Food insecurity    Worry: Not on file    Inability: Not on file  . Transportation needs    Medical: Not on file    Non-medical: Not on file  Tobacco Use  . Smoking status: Never Smoker  . Smokeless tobacco: Never Used  Substance and Sexual Activity  . Alcohol use: No  . Drug use: No  .  Sexual activity: Yes    Birth control/protection: Surgical  Lifestyle  . Physical activity    Days per week: Not on file    Minutes per session: Not on file  . Stress: Not on file  Relationships  . Social Herbalist on phone: Not on file    Gets together: Not on file    Attends religious service: Not on file    Active member of club or organization: Not on file    Attends meetings of clubs or organizations: Not on file    Relationship status: Not on file  Other Topics Concern  . Not on file  Social History  Narrative   Lives with husband and brother in [place].    5 children grown and married.    Cannot read or write in [place].    Husband can read and write in [place] or [place].      Observations/Objective: Awake, alert and oriented x 3   Review of Systems  Constitutional: Negative for fever, malaise/fatigue and weight loss.  HENT: Negative.  Negative for nosebleeds.   Eyes: Negative.  Negative for blurred vision, double vision and photophobia.  Respiratory: Negative.  Negative for cough and shortness of breath.   Cardiovascular: Negative.  Negative for chest pain, palpitations and leg swelling.  Gastrointestinal: Positive for heartburn. Negative for nausea and vomiting.  Musculoskeletal: Negative.  Negative for myalgias.  Neurological: Negative.  Negative for dizziness, focal weakness, seizures and headaches.  Psychiatric/Behavioral: Negative.  Negative for suicidal ideas.    Assessment and Plan: Tiffaney was evaluated today for hospitalization follow-up.  Diagnoses and all orders for this visit:  Gastroesophageal reflux disease, esophagitis presence not specified -     omeprazole (PRILOSEC) 20 MG capsule; Take 1 capsule (20 mg total) by mouth daily. Patient's daughter will pick up scripts today.  COVID-19 virus detected Asymptomatic. Discussed COVID isolation guidelines today.     Follow Up Instructions Return in about 2 weeks (around 12/27/2018) for GERD.     I discussed the assessment and treatment plan with the patient. The patient was provided an opportunity to ask questions and all were answered. The patient agreed with the plan and demonstrated an understanding of the instructions.   The patient was advised to call back or seek an in-person evaluation if the symptoms worsen or if the condition fails to improve as anticipated.  I provided 19 minutes of non-face-to-face time during this encounter including median intraservice time, reviewing previous notes, labs, imaging,  medications and explaining diagnosis and management.  Gildardo Pounds, FNP-BC

## 2019-01-07 ENCOUNTER — Other Ambulatory Visit: Payer: Self-pay

## 2019-01-07 ENCOUNTER — Encounter: Payer: Self-pay | Admitting: Nurse Practitioner

## 2019-01-07 ENCOUNTER — Ambulatory Visit: Payer: Self-pay | Attending: Nurse Practitioner | Admitting: Nurse Practitioner

## 2019-01-07 VITALS — Ht 60.0 in | Wt 174.0 lb

## 2019-01-07 DIAGNOSIS — M545 Low back pain, unspecified: Secondary | ICD-10-CM

## 2019-01-07 DIAGNOSIS — G8929 Other chronic pain: Secondary | ICD-10-CM

## 2019-01-07 DIAGNOSIS — K219 Gastro-esophageal reflux disease without esophagitis: Secondary | ICD-10-CM

## 2019-01-07 MED ORDER — METHOCARBAMOL 500 MG PO TABS: 500.0000 mg | ORAL_TABLET | Freq: Three times a day (TID) | ORAL | 0 refills | Status: DC | PRN

## 2019-01-07 MED ORDER — OMEPRAZOLE 20 MG PO CPDR: 20.0000 mg | DELAYED_RELEASE_CAPSULE | Freq: Every day | ORAL | 3 refills | Status: DC

## 2019-01-07 MED ORDER — NAPROXEN 500 MG PO TABS: 500.0000 mg | ORAL_TABLET | Freq: Two times a day (BID) | ORAL | 0 refills | Status: DC

## 2019-01-07 MED FILL — OMEPRAZOLE 20 MG CAP: 20 | 30 days supply | Qty: 30 | Fill #0

## 2019-01-07 MED FILL — NAPROXEN 500 MG TABLET: 500 | 30 days supply | Qty: 60 | Fill #0

## 2019-01-07 MED FILL — METHOCARBAMOL 500 MG TABS: 500 | 20 days supply | Qty: 60 | Fill #0

## 2019-01-07 NOTE — Progress Notes (Signed)
Virtual Visit via Telephone Note Due to national recommendations of social distancing due to [place] 19, telehealth visit is felt to be most appropriate for this patient at this time.  I discussed the limitations, risks, security and privacy concerns of performing an evaluation and management service by telephone and the availability of in person appointments. I also discussed with the patient that there may be a patient responsible charge related to this service. The patient expressed understanding and agreed to proceed.    I connected with Sheri Mann on 01/07/19  at   9:50 AM EDT  EDT by telephone and verified that I am speaking with the correct person using two identifiers.   Consent I discussed the limitations, risks, security and privacy concerns of performing an evaluation and management service by telephone and the availability of in person appointments. I also discussed with the patient that there may be a patient responsible charge related to this service. The patient expressed understanding and agreed to proceed.   Location of Patient: Private residence   Location of Provider: [place] participating in Telemedicine visit: [name] FNP-BC [facility] [place] [id number]   History of Present Illness: Telemedicine visit for: GERD FOLLOW UP. She also has complaints of back pain.   GERD F/U for GERD. She has been taking prescribed prilosec which has greatly relieved her symptoms of burning, acid, chest pain, and deep pressure at the base of the neck along with dysphagia, heartburn, mid epigastric pain and regurgitation. At this time we will continue as prescribed.  She denies hematemesis, melena and shortness of breath. Medical therapy in the past has included H2 antagonists.  Back Pain Onset 2 months ago. She denies any injury or trauma.  Aggravating factors: Bending, lifting  twisting, cleaning/house chores. Relieving factors: rest or wearing a back brace.    Past Medical History:  Diagnosis Date  . High cholesterol 2013   . Hypertension   . Obesity (BMI 30-39.9)   . Urinary incontinence, mixed     Past Surgical History:  Procedure Laterality Date  . BREAST EXCISIONAL BIOPSY Left   . BREAST LUMPECTOMY    . EYE SURGERY  2012  . TUBAL LIGATION  2000    Family History  Problem Relation Age of Onset  . Cancer Neg Hx   . Heart disease Neg Hx   . Diabetes Neg Hx   . Hypertension Neg Hx   . Breast cancer Neg Hx     Social History   Socioeconomic History  . Marital status: Married    Spouse name: Not on file  . Number of children: 5   . Years of education: 2   . Highest education level: Not on file  Occupational History  . Occupation: Unemployed   Social Needs  . Financial resource strain: Not on file  . Food insecurity    Worry: Not on file    Inability: Not on file  . Transportation needs    Medical: Not on file    Non-medical: Not on file  Tobacco Use  . Smoking status: Never Smoker  . Smokeless tobacco: Never Used  Substance and Sexual Activity  . Alcohol use: No  . Drug use: No  . Sexual activity: Yes    Birth control/protection: Surgical  Lifestyle  . Physical activity    Days per week: Not on file    Minutes per session: Not on file  .  Stress: Not on file  Relationships  . Social Musicianconnections    Talks on phone: Not on file    Gets together: Not on file    Attends religious service: Not on file    Active member of club or organization: Not on file    Attends meetings of clubs or organizations: Not on file    Relationship status: Not on file  Other Topics Concern  . Not on file  Social History Narrative   [place] and [place]    5 children grown and married.    Cannot read or write in spanish.    Husband can read and write in spanish or english.      Observations/Objective: Awake, alert and oriented x  3   Review of Systems  Constitutional: Negative for fever, malaise/fatigue and weight loss.  HENT: Negative.  Negative for nosebleeds.   Eyes: Negative.  Negative for blurred vision, double vision and photophobia.  Respiratory: Negative.  Negative for cough and shortness of breath.   Cardiovascular: Negative.  Negative for chest pain, palpitations and leg swelling.  Gastrointestinal: Positive for heartburn. Negative for abdominal pain, blood in stool, constipation, diarrhea, melena, nausea and vomiting.  Musculoskeletal: Positive for back pain. Negative for myalgias.  Neurological: Negative.  Negative for dizziness, focal weakness, seizures and headaches.  Psychiatric/Behavioral: Negative.  Negative for suicidal ideas.    Assessment and Plan: Sheri Mann was seen today for follow-up.  Diagnoses and all orders for this visit:  Gastroesophageal reflux disease, esophagitis presence not specified -     omeprazole (PRILOSEC) 20 MG capsule; Take 1 capsule (20 mg total) by mouth daily. Please mail  Chronic bilateral low back pain without sciatica -     naproxen (NAPROSYN) 500 MG tablet; Take 1 tablet (500 mg total) by mouth 2 (two) times daily with a meal. Please mail -     methocarbamol (ROBAXIN) 500 MG tablet; Take 1 tablet (500 mg total) by mouth every 8 (eight) hours as needed for muscle spasms. Work on losing weight to help reduce back pain. May alternate with heat and ice application for pain relief. May also alternate with acetaminophen as prescribed for back pain. Other alternatives include massage, acupuncture and water aerobics.  You must stay active and avoid a sedentary lifestyle.     Follow Up Instructions Return for PAP SMEAR and back pain.     I discussed the assessment and treatment plan with the patient. The patient was provided an opportunity to ask questions and all were answered. The patient agreed with the plan and demonstrated an understanding of the instructions.    The patient was advised to call back or seek an in-person evaluation if the symptoms worsen or if the condition fails to improve as anticipated.  I provided 24 minutes of non-face-to-face time during this encounter including median intraservice time, reviewing previous notes, labs, imaging, medications and explaining diagnosis and management.  Claiborne RiggZelda Mann , FNP-BC

## 2019-01-24 ENCOUNTER — Other Ambulatory Visit: Payer: Self-pay | Admitting: Nurse Practitioner

## 2019-02-21 ENCOUNTER — Other Ambulatory Visit: Payer: Self-pay | Admitting: Nurse Practitioner

## 2019-03-31 ENCOUNTER — Ambulatory Visit: Payer: Self-pay | Attending: Nurse Practitioner | Admitting: Nurse Practitioner

## 2019-03-31 ENCOUNTER — Other Ambulatory Visit: Payer: Self-pay

## 2019-03-31 ENCOUNTER — Encounter: Payer: Self-pay | Admitting: Nurse Practitioner

## 2019-03-31 VITALS — BP 122/80 | HR 71 | Temp 98.2°F | Ht 60.0 in | Wt 171.2 lb

## 2019-03-31 DIAGNOSIS — G8929 Other chronic pain: Secondary | ICD-10-CM

## 2019-03-31 DIAGNOSIS — Z124 Encounter for screening for malignant neoplasm of cervix: Secondary | ICD-10-CM

## 2019-03-31 DIAGNOSIS — M545 Low back pain: Secondary | ICD-10-CM

## 2019-03-31 MED ORDER — NAPROXEN 500 MG PO TABS: 500.0000 mg | ORAL_TABLET | Freq: Two times a day (BID) | ORAL | 0 refills | Status: DC

## 2019-03-31 MED ORDER — TIZANIDINE HCL 4 MG PO TABS: 4.0000 mg | ORAL_TABLET | Freq: Three times a day (TID) | ORAL | 0 refills | Status: DC | PRN

## 2019-03-31 MED FILL — tiZANidine HCL 4 MG TABS: 4 | 20 days supply | Qty: 60 | Fill #0

## 2019-03-31 MED FILL — NAPROXEN 500 MG TABLET: 500 | 30 days supply | Qty: 60 | Fill #0

## 2019-03-31 NOTE — Patient Instructions (Signed)
Dolor de espalda crnico Chronic Back Pain Cuando el dolor en la espalda dura ms de 3 meses, se denomina dolor de espalda crnico. El dolor puede empeorar en ciertos momentos (exacerbaciones). Hay cosas que puede hacer en su casa para manejar el dolor. Siga estas indicaciones en su casa: Actividad      Evite agacharse y realizar otras actividades que agraven el dolor.  Cuando est de pie: ? Mantenga la parte alta de la espalda y el cuello rectos. ? Mantenga los hombros hacia atrs. ? Evite encorvarse.  Cuando est sentado: ? Mantenga la espalda recta. ? Relaje los hombros. No curve los hombros ni los eche hacia atrs.  No permanezca sentado o de pie en el mismo lugar durante mucho tiempo.  Durante el da, haga pausas breves para descansar. Por lo general, recostarse o permanecer de pie es mejor que permanecer sentado. Descansar puede ayudar a aliviar el dolor.  Cuando est sentado o de pie por mucho tiempo, haga un poco de actividad moderada o ejercicios de estiramiento. Esto ayudar a evitar la rigidez y el dolor.  Realice actividad fsica con regularidad. Pregntele al mdico qu actividades son seguras para usted.  No levante ningn objeto que pese ms de 10libras (4,5kg). Para evitar lesionarse cuando levanta objetos: ? Flexione las rodillas. ? Mantenga el peso cerca del cuerpo. ? No gire el cuerpo. Control del dolor  Si se lo indican, aplique hielo sobre la zona dolorida. El mdico puede indicarle que use hielo durante las 24 a 48 horas luego del comienzo de una exacerbacin. ? Ponga el hielo en una bolsa plstica. ? Coloque una toalla entre la piel y la bolsa de hielo. ? Coloque el hielo durante 20minutos, 2 a 3veces por da.  Si se lo indican, aplique calor en la zona dolorida con la frecuencia que le indique el mdico. Use la fuente de calor que el mdico le recomiende, como una compresa de calor hmedo o una almohadilla trmica. ? Coloque una toalla entre la piel  y la fuente de calor. ? Aplique el calor durante 20 a 30minutos. ? Retire la fuente de calor si la piel se pone de color rojo brillante. Esto es muy importante si no puede sentir dolor, calor o fro. Puede correr un riesgo mayor de sufrir quemaduras.  Sumrjase en un bao clido. Esto puede ayudar a aliviar el dolor.  Tome los medicamentos de venta libre y los recetados solamente como se lo haya indicado el mdico. Instrucciones generales  Duerma sobre un colchn firme. Intente acostarse de costado, con las rodillas ligeramente flexionadas. Si se recuesta sobre la espalda, coloque una almohada debajo de las rodillas.  Concurra a todas las visitas de seguimiento como se lo haya indicado el mdico. Esto es importante. Comunquese con un mdico si:  Tiene un dolor que no mejora con descanso ni medicamentos. Solicite ayuda de inmediato si:  Siente debilidad en uno o ambos brazos o piernas.  Pierde la sensibilidad (adormecimiento) en uno o ambos brazos o piernas.  Le cuesta controlar cundo defecar (tener deposiciones) o hacer pis (orinar).  Siente malestar estomacal (nuseas).  Vomita.  Tiene dolor de vientre (abdominal).  Le falta el aire.  Pierde el conocimiento (se desmaya). Resumen  Cuando el dolor en la espalda dura ms de 3 meses, se denomina dolor de espalda crnico.  El dolor puede empeorar en ciertos momentos (exacerbaciones).  Use hielo y calor segn le indique el mdico. El mdico puede indicarle que use hielo luego del comienzo de   las exacerbaciones. Esta informacin no tiene como fin reemplazar el consejo del mdico. Asegrese de hacerle al mdico cualquier pregunta que tenga. Document Released: 12/19/2010 Document Revised: 08/30/2017 Document Reviewed: 04/02/2017 Elsevier Patient Education  2020 Elsevier Inc.  

## 2019-03-31 NOTE — Progress Notes (Signed)
Assessment & Plan:  Sheri Mann was seen today for gynecologic exam.  Diagnoses and all orders for this visit:  Encounter for Papanicolaou smear for cervical cancer screening -     Cytology - PAP -     Cervicovaginal ancillary only  Chronic bilateral low back pain without sciatica -     naproxen (NAPROSYN) 500 MG tablet; Take 1 tablet (500 mg total) by mouth 2 (two) times daily with a meal. Please mail -     tiZANidine (ZANAFLEX) 4 MG tablet; Take 1 tablet (4 mg total) by mouth every 8 (eight) hours as needed for muscle spasms.    Patient has been counseled on age-appropriate routine health concerns for screening and prevention. These are reviewed and up-to-date. Referrals have been placed accordingly. Immunizations are up-to-date or declined.    Subjective:   Chief Complaint  Patient presents with  . Gynecologic Exam    Pt. is here for pap smear.    HPI Sheri Mann 53 y.o. female presents to office today for PAP.  Chronic Back Pain Notes chronic back pain. Robaxin not effective. Taking OTC Ibuprofen which provides a little more relief than the muscle relaxant. Pain is intermittent and described as aching in the thoracic spine, neck and shoulders. Aggravated by nothing. Denies injury or trauma. Denies chest pain, shortness of breath, palpitations, lightheadedness, dizziness, headaches or BLE edema.  BP Readings from Last 3 Encounters:  03/31/19 122/80  11/25/18 (!) 140/97  06/26/18 134/83    Review of Systems  Constitutional: Negative.  Negative for chills, fever, malaise/fatigue and weight loss.  Respiratory: Negative.  Negative for cough, shortness of breath and wheezing.   Cardiovascular: Negative.  Negative for chest pain, orthopnea and leg swelling.  Gastrointestinal: Negative for abdominal pain.  Genitourinary: Negative.  Negative for flank pain.  Musculoskeletal: Positive for back pain, myalgias and neck pain.  Skin: Negative.  Negative for rash.   Psychiatric/Behavioral: Negative for suicidal ideas.    Past Medical History:  Diagnosis Date  . High cholesterol 2013   . Hypertension   . Obesity (BMI 30-39.9)   . Urinary incontinence, mixed     Past Surgical History:  Procedure Laterality Date  . BREAST EXCISIONAL BIOPSY Left   . BREAST LUMPECTOMY    . EYE SURGERY  2012  . TUBAL LIGATION  2000    Family History  Problem Relation Age of Onset  . Cancer Neg Hx   . Heart disease Neg Hx   . Diabetes Neg Hx   . Hypertension Neg Hx   . Breast cancer Neg Hx     Social History Reviewed with no changes to be made today.   Outpatient Medications Prior to Visit  Medication Sig Dispense Refill  . Calcium Carbonate-Vitamin D 600-400 MG-UNIT chew tablet Chew 1 tablet by mouth daily.    . Omega-3 300 MG CAPS Take by mouth.    Marland Kitchen omeprazole (PRILOSEC) 20 MG capsule Take 1 capsule (20 mg total) by mouth daily. Please mail 30 capsule 3  . methocarbamol (ROBAXIN) 500 MG tablet Take 1 tablet (500 mg total) by mouth every 8 (eight) hours as needed for muscle spasms. 60 tablet 0  . naproxen (NAPROSYN) 500 MG tablet Take 1 tablet (500 mg total) by mouth 2 (two) times daily with a meal. Please mail 60 tablet 0  . fluticasone (FLONASE) 50 MCG/ACT nasal spray Place 2 sprays into both nostrils daily. (Patient not taking: Reported on 12/13/2018) 16 g 6  . Multiple Vitamin (  MULTIVITAMIN) capsule Take 1 capsule by mouth daily.     No facility-administered medications prior to visit.     No Known Allergies     Objective:    BP 122/80 (BP Location: Left Arm, Patient Position: Sitting, Cuff Size: Large)   Pulse 71   Temp 98.2 F (36.8 C) (Oral)   Ht 5' (1.524 m)   Wt 171 lb 3.2 oz (77.7 kg)   LMP 11/11/2016 (Exact Date)   SpO2 98%   BMI 33.44 kg/m  Wt Readings from Last 3 Encounters:  03/31/19 171 lb 3.2 oz (77.7 kg)  01/07/19 174 lb (78.9 kg)  11/25/18 174 lb 2.6 oz (79 kg)    Physical Exam Constitutional:      Appearance: She is  well-developed.  HENT:     Head: Normocephalic.  Cardiovascular:     Rate and Rhythm: Normal rate and regular rhythm.     Heart sounds: Normal heart sounds.  Pulmonary:     Effort: Pulmonary effort is normal.     Breath sounds: Normal breath sounds.  Abdominal:     General: Bowel sounds are normal.     Palpations: Abdomen is soft.     Hernia: There is no hernia in the left inguinal area.  Genitourinary:    Labia:        Right: No rash, tenderness, lesion or injury.        Left: No rash, tenderness, lesion or injury.      Vagina: Normal. No signs of injury and foreign body. No vaginal discharge, erythema, tenderness or bleeding.     Cervix: No cervical motion tenderness or friability.     Uterus: Not deviated and not enlarged.      Adnexa:        Right: No mass, tenderness or fullness.         Left: No mass, tenderness or fullness.       Rectum: Normal. No external hemorrhoid.  Musculoskeletal: Normal range of motion.  Lymphadenopathy:     Lower Body: No right inguinal adenopathy. No left inguinal adenopathy.  Skin:    General: Skin is warm and dry.  Neurological:     Mental Status: She is alert and oriented to person, place, and time.  Psychiatric:        Behavior: Behavior normal.        Thought Content: Thought content normal.        Judgment: Judgment normal.          Patient has been counseled extensively about nutrition and exercise as well as the importance of adherence with medications and regular follow-up. The patient was given clear instructions to go to ER or return to medical center if symptoms don't improve, worsen or new problems develop. The patient verbalized understanding.   Follow-up: Return if symptoms worsen or fail to improve.   Gildardo Pounds, FNP-BC [facility] [place], [place]   03/31/2019, 9:26 AM

## 2019-04-07 ENCOUNTER — Ambulatory Visit: Payer: Self-pay | Attending: Family Medicine

## 2019-04-07 ENCOUNTER — Other Ambulatory Visit: Payer: Self-pay

## 2019-04-07 ENCOUNTER — Telehealth: Payer: Self-pay | Admitting: Nurse Practitioner

## 2019-04-07 ENCOUNTER — Other Ambulatory Visit: Payer: Self-pay | Admitting: Nurse Practitioner

## 2019-04-07 DIAGNOSIS — K219 Gastro-esophageal reflux disease without esophagitis: Secondary | ICD-10-CM

## 2019-04-07 LAB — CERVICOVAGINAL ANCILLARY ONLY
Bacterial Vaginitis (gardnerella): NEGATIVE
Candida Glabrata: NEGATIVE
Candida Vaginitis: NEGATIVE
Chlamydia: NEGATIVE
Comment: NEGATIVE
Comment: NEGATIVE
Comment: NEGATIVE
Comment: NEGATIVE
Comment: NEGATIVE
Comment: NORMAL
Neisseria Gonorrhea: NEGATIVE
Trichomonas: NEGATIVE

## 2019-04-07 MED FILL — OMEPRAZOLE 20 MG CAP: 20 | 30 days supply | Qty: 30 | Fill #0

## 2019-04-07 NOTE — Telephone Encounter (Signed)
Patient came in wanting to know what her pap results were. Please follow up.

## 2019-04-08 LAB — CYTOLOGY - PAP
Comment: NEGATIVE
Diagnosis: NEGATIVE
High risk HPV: NEGATIVE

## 2019-04-08 MED FILL — OMEPRAZOLE 20 MG CAP: 20 | 30 days supply | Qty: 30 | Fill #1

## 2019-04-16 NOTE — Telephone Encounter (Signed)
CMA spoke to patient and inform on lab results. Pt. Understood.  Spanish interpreter assist with the call.

## 2019-05-01 ENCOUNTER — Other Ambulatory Visit: Payer: Self-pay

## 2019-05-01 ENCOUNTER — Ambulatory Visit: Payer: Self-pay | Attending: Nurse Practitioner | Admitting: Physician Assistant

## 2019-05-01 DIAGNOSIS — R1032 Left lower quadrant pain: Secondary | ICD-10-CM

## 2019-05-01 DIAGNOSIS — M79605 Pain in left leg: Secondary | ICD-10-CM

## 2019-05-01 MED ORDER — METHOCARBAMOL 500 MG PO TABS: 1000.0000 mg | ORAL_TABLET | Freq: Three times a day (TID) | ORAL | 0 refills | Status: DC | PRN

## 2019-05-01 MED ORDER — DICLOFENAC SODIUM 75 MG PO TBEC: 75.0000 mg | DELAYED_RELEASE_TABLET | Freq: Two times a day (BID) | ORAL | 0 refills | Status: DC

## 2019-05-01 MED FILL — METHOCARBAMOL 500 MG TABS: 500 | 15 days supply | Qty: 90 | Fill #0

## 2019-05-01 MED FILL — DICLOFENAC SOD EC 75 MG TAB: 75 | 30 days supply | Qty: 60 | Fill #0

## 2019-05-01 NOTE — Progress Notes (Signed)
Virtual Visit via Telephone Note  I connected with Sheri Mann on 05/01/19 at  4:10 PM EST by telephone and verified that I am speaking with the correct person using two identifiers.   I discussed the limitations, risks, security and privacy concerns of performing an evaluation and management service by telephone and the availability of in person appointments. I also discussed with the patient that there may be a patient responsible charge related to this service. The patient expressed understanding and agreed to proceed.  Patient location:  home My Location:  [place] office Persons on the call: me, the patient, and the interpreter   History of Present Illness:  Pain in L abdomen and into L leg for 1 week.  LMP was several years.  No urinary s/sx.  No N/V/D/C.  Has not tried any OTC meds.  Naproxen helps a little with the pain.  No rash(she doesn't see a rash).  Describes the pain as burning and worse when she walks.  No back pain.  No fever.  Pain is almost constant. NKI.  Appetite is good.      Observations/Objective:  NAD.  A&Ox3   Assessment and Plan: 1. Left lower quadrant abdominal pain ?shingles prodrome-does not sound like acute abdomen - Comprehensive metabolic panel; Future - CBC with Differential; Future To UC or ED if develops rash and can't get f/up here.    2. Left leg pain ?shingles prodrome - diclofenac (VOLTAREN) 75 MG EC tablet; Take 1 tablet (75 mg total) by mouth 2 (two) times daily. With food prn pain  Dispense: 60 tablet; Refill: 0 - methocarbamol (ROBAXIN) 500 MG tablet; Take 2 tablets (1,000 mg total) by mouth every 8 (eight) hours as needed for muscle spasms.  Dispense: 90 tablet; Refill: 0    Follow Up Instructions: See PCP in 3 months   I discussed the assessment and treatment plan with the patient. The patient was provided an opportunity to ask questions and all were answered. The patient agreed with the plan and demonstrated an understanding of  the instructions.   The patient was advised to call back or seek an in-person evaluation if the symptoms worsen or if the condition fails to improve as anticipated.  I provided 14 minutes of non-face-to-face time during this encounter.   Freeman Caldron, PA-C  Patient ID: Sheri Mann, female   DOB: 11-18-1965, 53 y.o.   MRN: [id number]

## 2019-05-01 NOTE — Progress Notes (Signed)
Patient has been called and DOB has been verified. Patient has been screened and transferred to PCP to start phone visit.     

## 2019-05-02 ENCOUNTER — Ambulatory Visit: Payer: Self-pay | Attending: Nurse Practitioner

## 2019-05-02 ENCOUNTER — Other Ambulatory Visit: Payer: Self-pay

## 2019-05-02 DIAGNOSIS — R1032 Left lower quadrant pain: Secondary | ICD-10-CM

## 2019-05-03 LAB — CBC WITH DIFFERENTIAL/PLATELET
Basophils Absolute: 0 10*3/uL (ref 0.0–0.2)
Basos: 0 %
EOS (ABSOLUTE): 0 10*3/uL (ref 0.0–0.4)
Eos: 1 %
Hematocrit: 39.9 % (ref 34.0–46.6)
Hemoglobin: 13.2 g/dL (ref 11.1–15.9)
Immature Grans (Abs): 0 10*3/uL (ref 0.0–0.1)
Immature Granulocytes: 0 %
Lymphocytes Absolute: 1.8 10*3/uL (ref 0.7–3.1)
Lymphs: 42 %
MCH: 29.3 pg (ref 26.6–33.0)
MCHC: 33.1 g/dL (ref 31.5–35.7)
MCV: 89 fL (ref 79–97)
Monocytes Absolute: 0.3 10*3/uL (ref 0.1–0.9)
Monocytes: 7 %
Neutrophils Absolute: 2.1 10*3/uL (ref 1.4–7.0)
Neutrophils: 50 %
Platelets: 167 10*3/uL (ref 150–450)
RBC: 4.5 x10E6/uL (ref 3.77–5.28)
RDW: 12.7 % (ref 11.7–15.4)
WBC: 4.3 10*3/uL (ref 3.4–10.8)

## 2019-05-03 LAB — COMPREHENSIVE METABOLIC PANEL
ALT: 15 IU/L (ref 0–32)
AST: 18 IU/L (ref 0–40)
Albumin/Globulin Ratio: 1.7 (ref 1.2–2.2)
Albumin: 4.5 g/dL (ref 3.8–4.9)
Alkaline Phosphatase: 146 IU/L — ABNORMAL HIGH (ref 39–117)
BUN/Creatinine Ratio: 19 (ref 9–23)
BUN: 13 mg/dL (ref 6–24)
Bilirubin Total: 0.3 mg/dL (ref 0.0–1.2)
CO2: 24 mmol/L (ref 20–29)
Calcium: 10.1 mg/dL (ref 8.7–10.2)
Chloride: 105 mmol/L (ref 96–106)
Creatinine, Ser: 0.68 mg/dL (ref 0.57–1.00)
GFR calc Af Amer: 117 mL/min/{1.73_m2} (ref >59)
GFR calc non Af Amer: 102 mL/min/{1.73_m2} (ref >59)
Globulin, Total: 2.7 g/dL (ref 1.5–4.5)
Glucose: 104 mg/dL — ABNORMAL HIGH (ref 65–99)
Potassium: 4.7 mmol/L (ref 3.5–5.2)
Sodium: 140 mmol/L (ref 134–144)
Total Protein: 7.2 g/dL (ref 6.0–8.5)

## 2019-07-30 ENCOUNTER — Ambulatory Visit: Payer: Self-pay

## 2019-07-30 ENCOUNTER — Ambulatory Visit: Payer: Self-pay | Attending: Nurse Practitioner | Admitting: Physician Assistant

## 2019-07-30 ENCOUNTER — Other Ambulatory Visit: Payer: Self-pay

## 2019-07-30 DIAGNOSIS — M79605 Pain in left leg: Secondary | ICD-10-CM

## 2019-07-30 DIAGNOSIS — R8290 Unspecified abnormal findings in urine: Secondary | ICD-10-CM

## 2019-07-30 DIAGNOSIS — R202 Paresthesia of skin: Secondary | ICD-10-CM

## 2019-07-30 DIAGNOSIS — R0981 Nasal congestion: Secondary | ICD-10-CM

## 2019-07-30 LAB — POCT URINALYSIS DIP (CLINITEK)
Bilirubin, UA: NEGATIVE
Glucose, UA: NEGATIVE mg/dL
Ketones, POC UA: NEGATIVE mg/dL
Leukocytes, UA: NEGATIVE
Nitrite, UA: NEGATIVE
POC PROTEIN,UA: NEGATIVE
Spec Grav, UA: 1.025 (ref 1.010–1.025)
Urobilinogen, UA: 0.2 E.U./dL
pH, UA: 6.5 (ref 5.0–8.0)

## 2019-07-30 MED ORDER — FLUTICASONE PROPIONATE 50 MCG/ACT NA SUSP: 2.0000 | Freq: Every day | NASAL | 6 refills | Status: DC

## 2019-07-30 MED ORDER — GABAPENTIN 300 MG PO CAPS: 300.0000 mg | ORAL_CAPSULE | Freq: Every day | ORAL | 3 refills | Status: DC

## 2019-07-30 MED ORDER — DICLOFENAC SODIUM 75 MG PO TBEC: 75.0000 mg | DELAYED_RELEASE_TABLET | Freq: Two times a day (BID) | ORAL | 0 refills | Status: DC

## 2019-07-30 MED ORDER — METHOCARBAMOL 500 MG PO TABS: 1000.0000 mg | ORAL_TABLET | Freq: Three times a day (TID) | ORAL | 0 refills | Status: DC | PRN

## 2019-07-30 MED FILL — METHOCARBAMOL 500 MG TABS: 500 | 15 days supply | Qty: 90 | Fill #0

## 2019-07-30 MED FILL — FLUTICASONE PROP 50 MCG SPR: 50 | 30 days supply | Qty: 16 | Fill #0

## 2019-07-30 MED FILL — GABAPENTIN 300 MG CAPSULE: 300 | 30 days supply | Qty: 30 | Fill #0

## 2019-07-30 MED FILL — DICLOFENAC SOD EC 75 MG TAB: 75 | 30 days supply | Qty: 60 | Fill #0

## 2019-07-30 NOTE — Progress Notes (Signed)
DOB and name Verified   Lower left abdominal pain radiating down the the left leg X 1 Wk. Pain 8/10. c /o burning on urination and urinnary discomfort X 1 wk C /o headache every day X 1 wk. Did not try meds

## 2019-07-30 NOTE — Progress Notes (Signed)
Virtual Visit via Telephone Note  I connected with Sheri Mann on 07/30/19 at 11:10 AM EST by telephone and verified that I am speaking with the correct person using two identifiers.   I discussed the limitations, risks, security and privacy concerns of performing an evaluation and management service by telephone and the availability of in person appointments. I also discussed with the patient that there may be a patient responsible charge related to this service. The patient expressed understanding and agreed to proceed.  PATIENT visit by telephone virtually in the context of Covid-19 pandemic. Patient location:  home My Location:  [facility] office Persons on the call:  Me and the patient and Oscar(interpreter)   History of Present Illness:  C/o left lower abdominal pain radiating into L leg.  This has occurred before but it is different now bc her L leg is feeling numb.  Minimal pain in lower back.  Not affecting ability to walk.  Urine smells strong.  She is concerned about her kidneys.  No fever.  No N/V.  (similar pain I saw her for in November).    Denied some things to me that she had told my nurse differently-denies pain on urination.  Denies HA to me.      Observations/Objective:  NAD.A&Ox3   Assessment and Plan:  1. Left leg pain - methocarbamol (ROBAXIN) 500 MG tablet; Take 2 tablets (1,000 mg total) by mouth every 8 (eight) hours as needed for muscle spasms.  Dispense: 90 tablet; Refill: 0 - diclofenac (VOLTAREN) 75 MG EC tablet; Take 1 tablet (75 mg total) by mouth 2 (two) times daily. With food prn pain  Dispense: 60 tablet; Refill: 0  2. Paresthesia - gabapentin (NEURONTIN) 300 MG capsule; Take 1 capsule (300 mg total) by mouth at bedtime. Prn bedtime  Dispense: 30 capsule; Refill: 3 - TSH  3. Abnormal urine odor - Basic metabolic panel - POCT URINALYSIS DIP (CLINITEK)  4. Nasal congestion - fluticasone (FLONASE) 50 MCG/ACT nasal spray; Place 2 sprays into  both nostrils daily.  Dispense: 16 g; Refill: 6  Follow Up Instructions: See PCP in 4-6 weeks   I discussed the assessment and treatment plan with the patient. The patient was provided an opportunity to ask questions and all were answered. The patient agreed with the plan and demonstrated an understanding of the instructions.   The patient was advised to call back or seek an in-person evaluation if the symptoms worsen or if the condition fails to improve as anticipated.  I provided 13 minutes of non-face-to-face time during this encounter.   Georgian Co, PA-C  Patient ID: Sheri Mann, female   DOB: October 07, 1965, 54 y.o.   MRN: [id number]

## 2019-07-31 LAB — TSH: TSH: 1.15 u[IU]/mL (ref 0.450–4.500)

## 2019-07-31 LAB — BASIC METABOLIC PANEL
BUN/Creatinine Ratio: 14 (ref 9–23)
BUN: 11 mg/dL (ref 6–24)
CO2: 28 mmol/L (ref 20–29)
Calcium: 9.9 mg/dL (ref 8.7–10.2)
Chloride: 102 mmol/L (ref 96–106)
Creatinine, Ser: 0.77 mg/dL (ref 0.57–1.00)
GFR calc Af Amer: 103 mL/min/{1.73_m2} (ref >59)
GFR calc non Af Amer: 89 mL/min/{1.73_m2} (ref >59)
Glucose: 136 mg/dL — ABNORMAL HIGH (ref 65–99)
Potassium: 4.1 mmol/L (ref 3.5–5.2)
Sodium: 143 mmol/L (ref 134–144)

## 2019-08-01 ENCOUNTER — Telehealth (INDEPENDENT_AMBULATORY_CARE_PROVIDER_SITE_OTHER): Payer: Self-pay

## 2019-08-01 NOTE — Telephone Encounter (Signed)
Call placed using pacific interpreter [phone]) patient verified date of birth. She is aware that kidney function looks fine from her urine and blood work. Blood sugar was a little elevated/ advised her to eliminate sugars and eat less carbohydrates to help avoid developing diabetes. Keep scheduled appointment with PCP on 09/09/19 at 11:10. She verbalized understanding of results. Sheri Mann, CMA

## 2019-08-01 NOTE — Telephone Encounter (Signed)
-----   Message from Anders Simmonds, [place] sent at 07/31/2019  8:26 AM EST ----- Please call patient and let her know that her kidney function looks fine from urine and blood work.  Her blood sugar is a little high.  She should eliminate sugar and eat less carbohydrates to avoid developing diabetes.  See PCP as next planned.  Thanks, Georgian Co, PA-C

## 2019-08-17 IMAGING — MG DIGITAL SCREENING BILATERAL MAMMOGRAM WITH CAD
4 series · 4 of 4 positions shown · non-contrast
Comparison: Previous exam(s).

CLINICAL DATA: Screening.

EXAM:
DIGITAL SCREENING BILATERAL MAMMOGRAM WITH CAD

[R MLO]
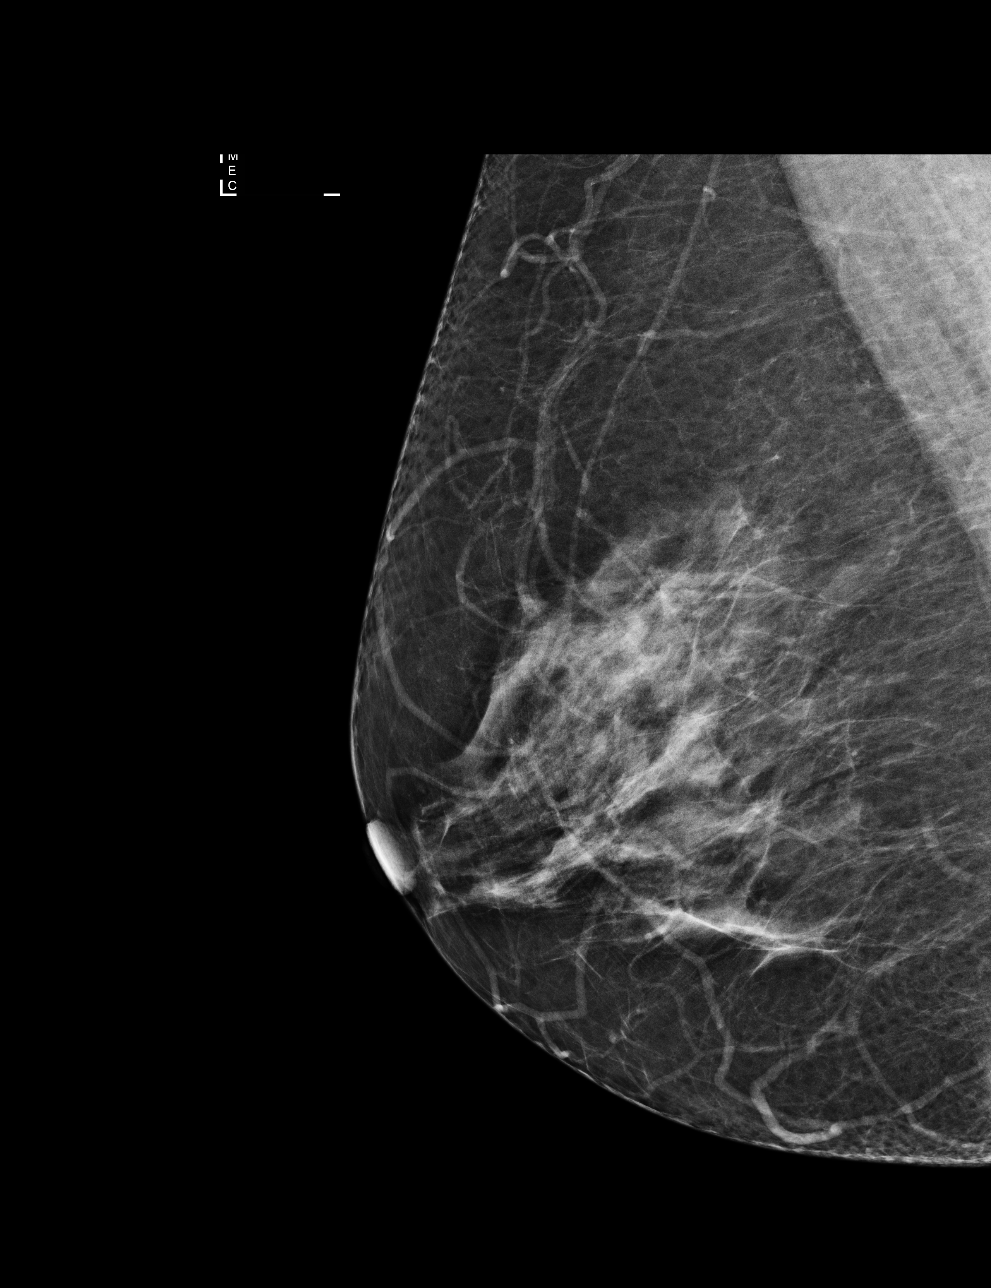

[R CC]
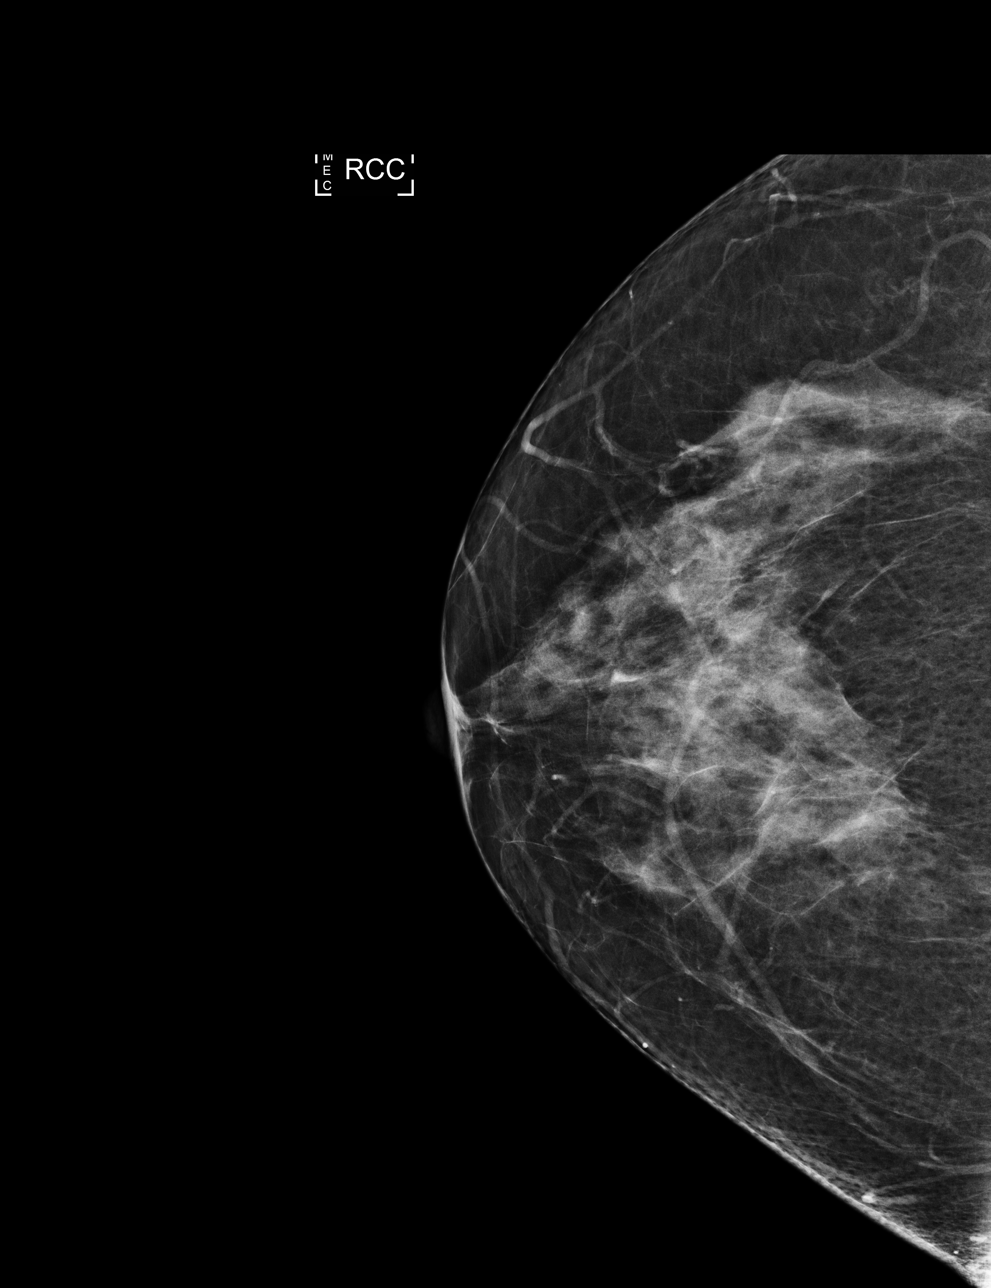

[L CC]
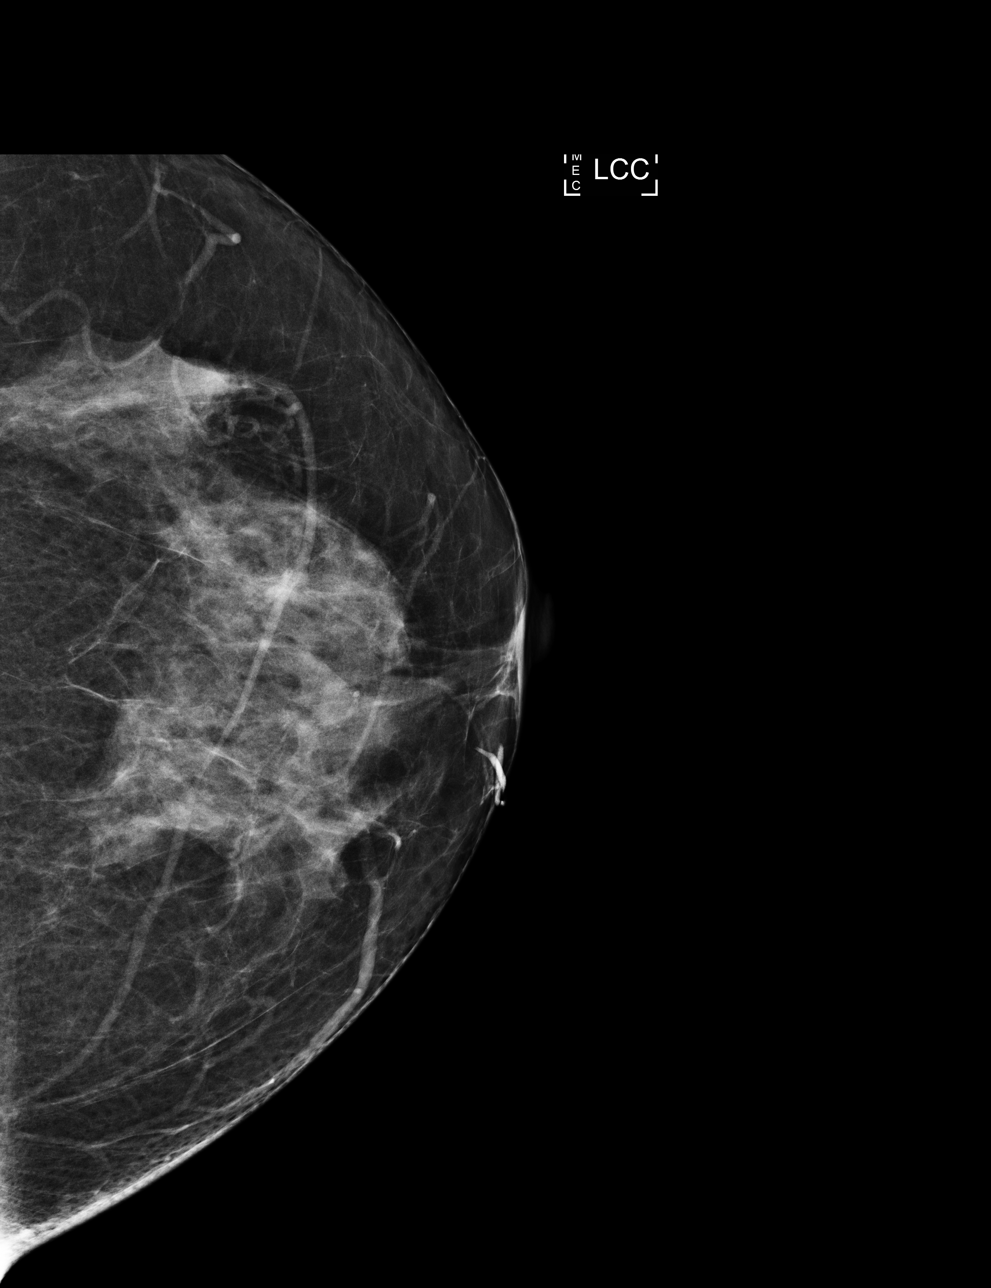

[L MLO]
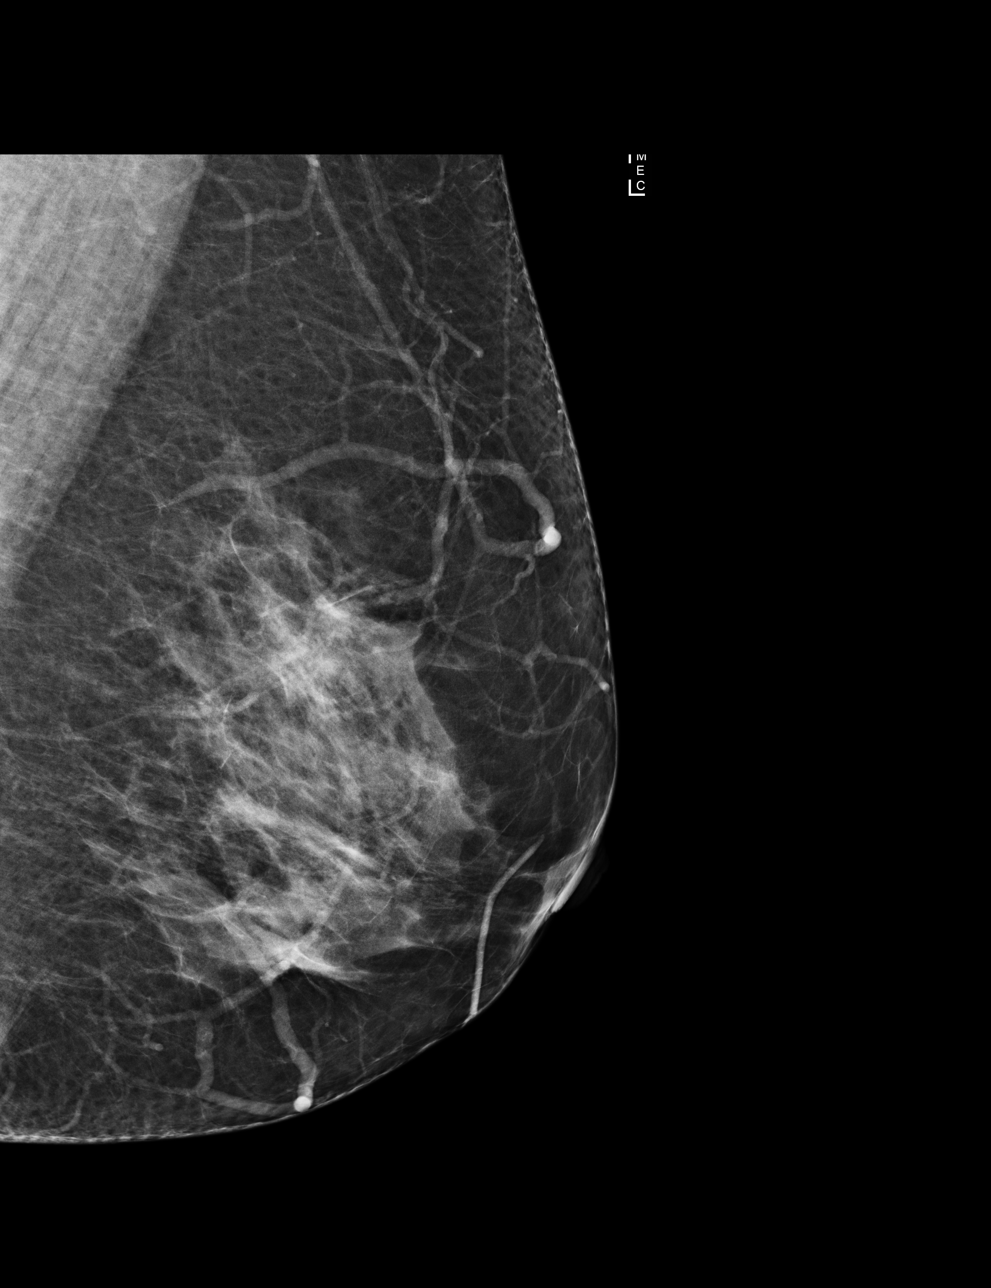

[4 of 4 positions shown; findings below may reference images not displayed]

ACR Breast Density Category c: The breast tissue is heterogeneously
dense, which may obscure small masses.
FINDINGS: There are no findings suspicious for malignancy. Images were
processed with CAD.
IMPRESSION: No mammographic evidence of malignancy. A result letter of this
screening mammogram will be mailed directly to the patient.

RECOMMENDATION:
Screening mammogram in one year. (Code:YJ-[id number])

BI-RADS CATEGORY  1: Negative.

## 2019-09-09 ENCOUNTER — Other Ambulatory Visit: Payer: Self-pay

## 2019-09-09 ENCOUNTER — Other Ambulatory Visit: Payer: Self-pay | Admitting: Nurse Practitioner

## 2019-09-09 ENCOUNTER — Encounter: Payer: Self-pay | Admitting: Nurse Practitioner

## 2019-09-09 ENCOUNTER — Ambulatory Visit: Payer: Self-pay | Attending: Nurse Practitioner | Admitting: Nurse Practitioner

## 2019-09-09 DIAGNOSIS — E876 Hypokalemia: Secondary | ICD-10-CM

## 2019-09-09 DIAGNOSIS — H5789 Other specified disorders of eye and adnexa: Secondary | ICD-10-CM

## 2019-09-09 DIAGNOSIS — R7303 Prediabetes: Secondary | ICD-10-CM

## 2019-09-09 DIAGNOSIS — E785 Hyperlipidemia, unspecified: Secondary | ICD-10-CM

## 2019-09-09 DIAGNOSIS — J302 Other seasonal allergic rhinitis: Secondary | ICD-10-CM

## 2019-09-09 MED ORDER — AZELASTINE HCL 0.05 % OP SOLN: 1.0000 [drp] | Freq: Every day | OPHTHALMIC | 1 refills | Status: DC

## 2019-09-09 MED FILL — ?AZELASTINE HCL 0.05 % SOLN: 0.05 | 90 days supply | Qty: 6 | Fill #0

## 2019-09-09 NOTE — Progress Notes (Signed)
Virtual Visit via Telephone Note Due to national recommendations of social distancing due to [place] 19, telehealth visit is felt to be most appropriate for this patient at this time.  I discussed the limitations, risks, security and privacy concerns of performing an evaluation and management service by telephone and the availability of in person appointments. I also discussed with the patient that there may be a patient responsible charge related to this service. The patient expressed understanding and agreed to proceed.    I connected with Towana Badger on 09/09/19  at  11:10 AM EDT  EDT by telephone and verified that I am speaking with the correct person using two identifiers.   Consent I discussed the limitations, risks, security and privacy concerns of performing an evaluation and management service by telephone and the availability of in person appointments. I also discussed with the patient that there may be a patient responsible charge related to this service. The patient expressed understanding and agreed to proceed.   Location of Patient: Private Residence    Location of Provider: [place] [facility] [employer] Office    Persons participating in Telemedicine visit: [name] FNP-BC [place] ID# [place] [id number]   History of Present Illness: Telemedicine visit for: F/U  Follow up to left leg pain and abdominal pain. Currently resolved.  She does endorse mild headache today.   Concerns of left eye redness. Onset one month ago. States she initially had redness in the right eye that looked like blood. Slowly resolved over the past few weeks now with left eye redness that is similar to the previous right eye redness. She denies any heavy lifting, eye pain, discharge, visual disturbances or floaters. She does wear readers.     Past Medical History:  Diagnosis Date  . High cholesterol 2013   . Hypertension   .  Obesity (BMI 30-39.9)   . Urinary incontinence, mixed     Past Surgical History:  Procedure Laterality Date  . BREAST EXCISIONAL BIOPSY Left   . BREAST LUMPECTOMY    . EYE SURGERY  2012  . TUBAL LIGATION  2000    Family History  Problem Relation Age of Onset  . Cancer Neg Hx   . Heart disease Neg Hx   . Diabetes Neg Hx   . Hypertension Neg Hx   . Breast cancer Neg Hx     Social History   Socioeconomic History  . Marital status: Married    Spouse name: Not on file  . Number of children: 5   . Years of education: 2   . Highest education level: Not on file  Occupational History  . Occupation: Unemployed   Tobacco Use  . Smoking status: Never Smoker  . Smokeless tobacco: Never Used  Substance and Sexual Activity  . Alcohol use: No  . Drug use: No  . Sexual activity: Yes    Birth control/protection: Surgical  Other Topics Concern  . Not on file  Social History Narrative   Lives with husband and brother in [place].    5 children grown and married.    Cannot read or write in [place].    Husband can read and write in [place] or [place].    Social Determinants of Health   Financial Resource Strain:   . Difficulty of Paying Living Expenses:   Food Insecurity:   . Worried About Charity fundraiser in the Last Year:   . [employer]  in the Last Year:   Transportation Needs:   . Freight forwarder (Medical):   Marland Kitchen Lack of Transportation (Non-Medical):   Physical Activity:   . Days of Exercise per Week:   . Minutes of Exercise per Session:   Stress:   . Feeling of Stress :   Social Connections:   . Frequency of Communication with Friends and Family:   . Frequency of Social Gatherings with Friends and Family:   . Attends Religious Services:   . Active Member of Clubs or Organizations:   . Attends Banker Meetings:   Marland Kitchen Marital Status:      Observations/Objective: Awake, alert and oriented x 3   Review of Systems  Constitutional: Negative for  fever, malaise/fatigue and weight loss.  HENT: Negative.  Negative for nosebleeds.   Eyes: Positive for redness. Negative for blurred vision, double vision, photophobia, pain and discharge.  Respiratory: Negative.  Negative for cough and shortness of breath.   Cardiovascular: Negative.  Negative for chest pain, palpitations and leg swelling.  Gastrointestinal: Negative.  Negative for heartburn, nausea and vomiting.  Musculoskeletal: Negative.  Negative for myalgias.  Neurological: Negative.  Negative for dizziness, focal weakness, seizures and headaches.  Psychiatric/Behavioral: Negative.  Negative for suicidal ideas.    Assessment and Plan: Ulani was seen today for follow-up.  Diagnoses and all orders for this visit:  Seasonal allergies -     azelastine (OPTIVAR) 0.05 % ophthalmic solution; Place 1 drop into the left eye daily.     Follow Up Instructions Return in about 3 months (around 12/10/2019).     I discussed the assessment and treatment plan with the patient. The patient was provided an opportunity to ask questions and all were answered. The patient agreed with the plan and demonstrated an understanding of the instructions.   The patient was advised to call back or seek an in-person evaluation if the symptoms worsen or if the condition fails to improve as anticipated.  I provided 12 minutes of non-face-to-face time during this encounter including median intraservice time, reviewing previous notes, labs, imaging, medications and explaining diagnosis and management.  Claiborne Rigg, FNP-BC

## 2019-09-09 NOTE — Progress Notes (Signed)
cmp

## 2019-09-09 NOTE — Addendum Note (Signed)
Addended byVidal Schwalbe on: 09/09/2019 02:52 PM   Modules accepted: Orders

## 2019-09-10 LAB — HEMOGLOBIN A1C
Est. average glucose Bld gHb Est-mCnc: 126 mg/dL
Hgb A1c MFr Bld: 6 % — ABNORMAL HIGH (ref 4.8–5.6)

## 2019-09-10 LAB — CMP14+EGFR
ALT: 22 IU/L (ref 0–32)
AST: 17 IU/L (ref 0–40)
Albumin/Globulin Ratio: 1.8 (ref 1.2–2.2)
Albumin: 4.6 g/dL (ref 3.8–4.9)
Alkaline Phosphatase: 152 IU/L — ABNORMAL HIGH (ref 39–117)
BUN/Creatinine Ratio: 13 (ref 9–23)
BUN: 9 mg/dL (ref 6–24)
Bilirubin Total: 0.3 mg/dL (ref 0.0–1.2)
CO2: 24 mmol/L (ref 20–29)
Calcium: 10 mg/dL (ref 8.7–10.2)
Chloride: 105 mmol/L (ref 96–106)
Creatinine, Ser: 0.69 mg/dL (ref 0.57–1.00)
GFR calc Af Amer: 116 mL/min/{1.73_m2} (ref >59)
GFR calc non Af Amer: 100 mL/min/{1.73_m2} (ref >59)
Globulin, Total: 2.5 g/dL (ref 1.5–4.5)
Glucose: 91 mg/dL (ref 65–99)
Potassium: 4.5 mmol/L (ref 3.5–5.2)
Sodium: 142 mmol/L (ref 134–144)
Total Protein: 7.1 g/dL (ref 6.0–8.5)

## 2019-09-10 LAB — LIPID PANEL
Chol/HDL Ratio: 4.3 ratio (ref 0.0–4.4)
Cholesterol, Total: 213 mg/dL — ABNORMAL HIGH (ref 100–199)
HDL: 49 mg/dL (ref >39)
LDL Chol Calc (NIH): 139 mg/dL — ABNORMAL HIGH (ref 0–99)
Triglycerides: 137 mg/dL (ref 0–149)
VLDL Cholesterol Cal: 25 mg/dL (ref 5–40)

## 2019-10-20 ENCOUNTER — Ambulatory Visit: Payer: Self-pay | Attending: Nurse Practitioner | Admitting: Nurse Practitioner

## 2019-10-20 ENCOUNTER — Encounter: Payer: Self-pay | Admitting: Nurse Practitioner

## 2019-10-20 ENCOUNTER — Other Ambulatory Visit: Payer: Self-pay

## 2019-10-20 VITALS — BP 144/90 | HR 73 | Temp 98.1°F | Wt 167.0 lb

## 2019-10-20 DIAGNOSIS — R7401 Elevation of levels of liver transaminase levels: Secondary | ICD-10-CM

## 2019-10-20 DIAGNOSIS — R1084 Generalized abdominal pain: Secondary | ICD-10-CM

## 2019-10-20 DIAGNOSIS — R0789 Other chest pain: Secondary | ICD-10-CM

## 2019-10-20 DIAGNOSIS — R202 Paresthesia of skin: Secondary | ICD-10-CM

## 2019-10-20 DIAGNOSIS — K219 Gastro-esophageal reflux disease without esophagitis: Secondary | ICD-10-CM

## 2019-10-20 MED ORDER — OMEPRAZOLE 20 MG PO CPDR: 20.0000 mg | DELAYED_RELEASE_CAPSULE | Freq: Every day | ORAL | 2 refills | Status: DC

## 2019-10-20 MED ORDER — METHOCARBAMOL 500 MG PO TABS: 1000.0000 mg | ORAL_TABLET | Freq: Three times a day (TID) | ORAL | 0 refills | Status: DC | PRN

## 2019-10-20 MED ORDER — GABAPENTIN 300 MG PO CAPS: 300.0000 mg | ORAL_CAPSULE | Freq: Every day | ORAL | 3 refills | Status: DC

## 2019-10-20 MED FILL — METHOCARBAMOL 500 MG TABS: 500 | 15 days supply | Qty: 90 | Fill #0

## 2019-10-20 MED FILL — ?OMEPRAZOLE 20 MG CAP DR: 20 | 30 days supply | Qty: 30 | Fill #0

## 2019-10-20 MED FILL — GABAPENTIN 300 MG CAPSULE: 300 | 30 days supply | Qty: 30 | Fill #0

## 2019-10-20 NOTE — Progress Notes (Signed)
Assessment & Plan:  Amneet was seen today for abdominal pain.  Diagnoses and all orders for this visit:  Transaminitis -     CMP14+EGFR  Generalized abdominal pain -     CBC -     CT Abdomen Pelvis Wo Contrast; Future  Chest wall pain -     DG Chest 2 View; Future -     methocarbamol (ROBAXIN) 500 MG tablet; Take 2 tablets (1,000 mg total) by mouth every 8 (eight) hours as needed for muscle spasms.  Paresthesia -     gabapentin (NEURONTIN) 300 MG capsule; Take 1 capsule (300 mg total) by mouth at bedtime. Prn bedtime  Gastroesophageal reflux disease -     omeprazole (PRILOSEC) 20 MG capsule; Take 1 capsule (20 mg total) by mouth daily. INSTRUCTIONS: Avoid GERD Triggers: acidic, spicy or fried foods, caffeine, coffee, sodas,  alcohol and chocolate.   Patient has been counseled on age-appropriate routine health concerns for screening and prevention. These are reviewed and up-to-date. Referrals have been placed accordingly. Immunizations are up-to-date or declined.    Subjective:   Chief Complaint  Patient presents with  . Abdominal Pain    x 2.5 weeks. only has pain when area is touched.doesn't get worse after eating. denies nausea, vomiting, constipation, diarrhea   HPI Sheri Mann 54 y.o. female presents to office today with complaints of lower left rib cage pain and epigastric pain. Denies dysuria, nausea and vomiting. She does endorse burning sensation in the epigastric region. She has not been taking omeprazole. She also has a history of transaminitis.   Recent imaging CT abdomen pelvis 1. Abnormal lung bases with bilateral peripheral patchy and indistinct opacity, suspicious for acute viral/atypical respiratory infection. COVID-19 testing may be valuable. 2. Difficult to exclude mild acute colitis, with questionable generalized large bowel wall thickening. 3. No other acute or inflammatory process identified in the abdomen or pelvis.   Chest  Xray Mild bibasilar subsegmental atelectasis. No other active cardiopulmonary disease.      Review of Systems  Constitutional: Negative for fever, malaise/fatigue and weight loss.  HENT: Negative.  Negative for nosebleeds.   Eyes: Negative.  Negative for blurred vision, double vision and photophobia.  Respiratory: Negative.  Negative for cough and shortness of breath.   Cardiovascular: Negative for chest pain, palpitations and leg swelling.       Chest wall pain  Gastrointestinal: Positive for abdominal pain and heartburn. Negative for blood in stool, constipation (has several BMs a day), diarrhea, melena, nausea and vomiting.  Musculoskeletal: Positive for myalgias.  Neurological: Negative.  Negative for dizziness, focal weakness, seizures and headaches.  Psychiatric/Behavioral: Negative.  Negative for suicidal ideas.    Past Medical History:  Diagnosis Date  . High cholesterol 2013   . Hypertension   . Obesity (BMI 30-39.9)   . Urinary incontinence, mixed     Past Surgical History:  Procedure Laterality Date  . BREAST EXCISIONAL BIOPSY Left   . BREAST LUMPECTOMY    . EYE SURGERY  2012  . TUBAL LIGATION  2000    Family History  Problem Relation Age of Onset  . Cancer Neg Hx   . Heart disease Neg Hx   . Diabetes Neg Hx   . Hypertension Neg Hx   . Breast cancer Neg Hx     Social History Reviewed with no changes to be made today.   Outpatient Medications Prior to Visit  Medication Sig Dispense Refill  . fluticasone (FLONASE) 50 MCG/ACT nasal  spray Place 2 sprays into both nostrils daily. 16 g 6  . Omega-3 300 MG CAPS Take 1 capsule by mouth 2 (two) times daily.     Marland Kitchen azelastine (OPTIVAR) 0.05 % ophthalmic solution Place 1 drop into the left eye daily. (Patient not taking: Reported on 10/20/2019) 6 mL 1  . Calcium Carbonate-Vitamin D 600-400 MG-UNIT chew tablet Chew 1 tablet by mouth daily.    . diclofenac (VOLTAREN) 75 MG EC tablet Take 1 tablet (75 mg total) by mouth  2 (two) times daily. With food prn pain (Patient not taking: Reported on 10/20/2019) 60 tablet 0  . gabapentin (NEURONTIN) 300 MG capsule Take 1 capsule (300 mg total) by mouth at bedtime. Prn bedtime (Patient not taking: Reported on 10/20/2019) 30 capsule 3  . methocarbamol (ROBAXIN) 500 MG tablet Take 2 tablets (1,000 mg total) by mouth every 8 (eight) hours as needed for muscle spasms. 90 tablet 0  . Multiple Vitamin (MULTIVITAMIN) capsule Take 1 capsule by mouth daily.    Marland Kitchen omeprazole (PRILOSEC) 20 MG capsule Take 1 capsule (20 mg total) by mouth daily. (Patient not taking: Reported on 05/01/2019) 30 capsule 2  . tiZANidine (ZANAFLEX) 4 MG tablet Take 1 tablet (4 mg total) by mouth every 8 (eight) hours as needed for muscle spasms. (Patient not taking: Reported on 05/01/2019) 60 tablet 0   No facility-administered medications prior to visit.    No Known Allergies     Objective:    BP (!) 144/90   Pulse 73   Temp 98.1 F (36.7 C) (Temporal)   Wt 167 lb (75.8 kg)   LMP 11/11/2016 (Exact Date)   SpO2 97%   BMI 32.61 kg/m  Wt Readings from Last 3 Encounters:  10/20/19 167 lb (75.8 kg)  03/31/19 171 lb 3.2 oz (77.7 kg)  01/07/19 174 lb (78.9 kg)    Physical Exam Vitals and nursing note reviewed.  Constitutional:      Appearance: She is well-developed.  HENT:     Head: Normocephalic and atraumatic.  Cardiovascular:     Rate and Rhythm: Normal rate and regular rhythm.     Heart sounds: Normal heart sounds. No murmur. No friction rub. No gallop.   Pulmonary:     Effort: Pulmonary effort is normal. No tachypnea or respiratory distress.     Breath sounds: Normal breath sounds. No decreased breath sounds, wheezing, rhonchi or rales.  Chest:     Chest wall: Tenderness present.    Abdominal:     General: Bowel sounds are normal.     Palpations: Abdomen is soft.     Tenderness: There is generalized abdominal tenderness. There is no right CVA tenderness or left CVA tenderness.      Hernia: No hernia is present.    Musculoskeletal:        General: Normal range of motion.     Cervical back: Normal range of motion.  Skin:    General: Skin is warm and dry.  Neurological:     Mental Status: She is alert and oriented to person, place, and time.     Coordination: Coordination normal.  Psychiatric:        Behavior: Behavior normal. Behavior is cooperative.        Thought Content: Thought content normal.        Judgment: Judgment normal.          Patient has been counseled extensively about nutrition and exercise as well as the importance of adherence with medications  and regular follow-up. The patient was given clear instructions to go to ER or return to medical center if symptoms don't improve, worsen or new problems develop. The patient verbalized understanding.   Follow-up: Return in about 3 months (around 01/20/2020).   Gildardo Pounds, FNP-BC [facility] and [place], [place]   10/20/2019, 2:50 PM

## 2019-10-20 NOTE — Patient Instructions (Signed)
Su tomografa computarizada ha sido programada en [facility] el 10/27/2019 a las 3:45 p.m. Debe ir al Hospital a ms tardar el 10/26/2019 para recoger el medio de contraste oral. Debe beber la primera botella a las 2 pm y la segunda botella a las 3 pm. [place], no puede comer ni beber nada ms despus de las 12 pm del 10/27/2019.

## 2019-10-21 ENCOUNTER — Other Ambulatory Visit: Payer: Self-pay | Admitting: Nurse Practitioner

## 2019-10-21 DIAGNOSIS — R7401 Elevation of levels of liver transaminase levels: Secondary | ICD-10-CM

## 2019-10-21 LAB — CMP14+EGFR
ALT: 23 IU/L (ref 0–32)
AST: 21 IU/L (ref 0–40)
Albumin/Globulin Ratio: 2 (ref 1.2–2.2)
Albumin: 4.5 g/dL (ref 3.8–4.9)
Alkaline Phosphatase: 165 IU/L — ABNORMAL HIGH (ref 39–117)
BUN/Creatinine Ratio: 16 (ref 9–23)
BUN: 12 mg/dL (ref 6–24)
Bilirubin Total: <0.2 mg/dL (ref 0.0–1.2)
CO2: 24 mmol/L (ref 20–29)
Calcium: 9.8 mg/dL (ref 8.7–10.2)
Chloride: 107 mmol/L — ABNORMAL HIGH (ref 96–106)
Creatinine, Ser: 0.75 mg/dL (ref 0.57–1.00)
GFR calc Af Amer: 106 mL/min/{1.73_m2} (ref >59)
GFR calc non Af Amer: 92 mL/min/{1.73_m2} (ref >59)
Globulin, Total: 2.3 g/dL (ref 1.5–4.5)
Glucose: 89 mg/dL (ref 65–99)
Potassium: 4.6 mmol/L (ref 3.5–5.2)
Sodium: 142 mmol/L (ref 134–144)
Total Protein: 6.8 g/dL (ref 6.0–8.5)

## 2019-10-21 LAB — CBC
Hematocrit: 38.1 % (ref 34.0–46.6)
Hemoglobin: 12.5 g/dL (ref 11.1–15.9)
MCH: 29.8 pg (ref 26.6–33.0)
MCHC: 32.8 g/dL (ref 31.5–35.7)
MCV: 91 fL (ref 79–97)
Platelets: 187 10*3/uL (ref 150–450)
RBC: 4.2 x10E6/uL (ref 3.77–5.28)
RDW: 12.9 % (ref 11.7–15.4)
WBC: 5.9 10*3/uL (ref 3.4–10.8)

## 2019-10-27 ENCOUNTER — Other Ambulatory Visit: Payer: Self-pay

## 2019-10-27 ENCOUNTER — Ambulatory Visit (HOSPITAL_COMMUNITY)
Admission: RE | Admit: 2019-10-27 | Discharge: 2019-10-27 | Disposition: A | Discharge status: Home / Self Care | Payer: Self-pay | Source: Ambulatory Visit | Attending: Nurse Practitioner | Admitting: Nurse Practitioner

## 2019-10-27 DIAGNOSIS — R1084 Generalized abdominal pain: Secondary | ICD-10-CM | POA: Insufficient documentation

## 2019-10-30 ENCOUNTER — Other Ambulatory Visit: Payer: Self-pay

## 2019-10-30 ENCOUNTER — Ambulatory Visit: Payer: Self-pay | Attending: Nurse Practitioner

## 2019-10-31 ENCOUNTER — Telehealth: Payer: Self-pay | Admitting: Nurse Practitioner

## 2019-11-01 ENCOUNTER — Other Ambulatory Visit: Payer: Self-pay | Admitting: Nurse Practitioner

## 2019-11-01 DIAGNOSIS — Z1211 Encounter for screening for malignant neoplasm of colon: Secondary | ICD-10-CM

## 2019-11-01 DIAGNOSIS — R1013 Epigastric pain: Secondary | ICD-10-CM

## 2019-11-03 ENCOUNTER — Encounter: Payer: Self-pay | Admitting: Gastroenterology

## 2019-11-13 ENCOUNTER — Telehealth: Payer: Self-pay | Admitting: Nurse Practitioner

## 2019-11-13 NOTE — Telephone Encounter (Signed)
Pt was sent a letter from financial dept. Inform them, that the application they submitted was incomplete, since they were missing some documentation at the time of the appointment, Pt need to reschedule and resubmit all new papers and application for CAFA and OC, P.S. old documents has been sent back by mail to the Pt and Pt. need to make a new appt. 

## 2019-12-01 ENCOUNTER — Ambulatory Visit: Payer: Self-pay

## 2019-12-01 ENCOUNTER — Other Ambulatory Visit: Payer: Self-pay

## 2019-12-09 ENCOUNTER — Ambulatory Visit (INDEPENDENT_AMBULATORY_CARE_PROVIDER_SITE_OTHER): Payer: Self-pay | Admitting: Gastroenterology

## 2019-12-09 ENCOUNTER — Encounter: Payer: Self-pay | Admitting: Gastroenterology

## 2019-12-09 ENCOUNTER — Other Ambulatory Visit: Payer: Self-pay

## 2019-12-09 VITALS — BP 130/90 | HR 68 | Ht 60.0 in | Wt 165.2 lb

## 2019-12-09 DIAGNOSIS — R748 Abnormal levels of other serum enzymes: Secondary | ICD-10-CM

## 2019-12-09 DIAGNOSIS — R109 Unspecified abdominal pain: Secondary | ICD-10-CM

## 2019-12-09 DIAGNOSIS — R933 Abnormal findings on diagnostic imaging of other parts of digestive tract: Secondary | ICD-10-CM

## 2019-12-09 MED ORDER — DICYCLOMINE HCL 20 MG PO TABS: 20.0000 mg | ORAL_TABLET | Freq: Four times a day (QID) | ORAL | 3 refills | Status: DC

## 2019-12-09 MED FILL — DICYCLOMINE 20 MG TABLET: 20 | 12 days supply | Qty: 50 | Fill #0

## 2019-12-09 NOTE — Patient Instructions (Signed)
Su proveedor ha [employer] que vaya al nivel del stano para realizar anlisis de laboratorio antes de salir hoy. Presione "B" en el ascensor. El laboratorio est ubicado en la primera puerta a la izquierda al salir del Medical sales representative.  Hemos enviado los siguientes medicamentos a su farmacia para que los recoja a su conveniencia: Dicyclomine   Se le ha programado una endoscopia y Neomia Dear colonoscopia. Siga las instrucciones escritas que se le dieron en su visita de hoy. Recoja sus suministros de preparacin en la farmacia dentro de los prximos 1-3 das. Si [place] inhaladores (aunque solo sea necesario), trigalos el da de su procedimiento.

## 2019-12-09 NOTE — Progress Notes (Signed)
Referring Provider: Gildardo Pounds, NP Primary Care Physician:  Gildardo Pounds, NP  Reason for Consultation:  Abdominal pain   IMPRESSION:  Upper abdominal pain - band like distribution    - symptoms developed after Covid Abnormal CT scan 11/2018 - ? 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 EGD recommended to exclude concurrent processes such as H pylori, PUD, gastritis, esophagitis. Abnormal CT scan 11/2018 was likely due to Covid. Trial of dicyclomine in the meantime.   Colonoscopy recommended to evaluate her symptoms and for colon cancer screeing.   Elevated alk phos likely due to fatty liver given her concurrent risks. Will proceed with isoenzymes for confirmation.   PLAN: Alk phos isoenzymes EGD Colonoscopy Dicyclomine 20 mg QID (#50 with 3 refills)  Please see the "Patient Instructions" section for addition details about the plan.  HPI: Sheri Mann is a 54 y.o. female referred by Dr. Raul Del for evaluation of abdominal pain. The history is obtained through the patient with assistance of the Spanish interpreter and review of her electronic health record.  Even with the assistance of the interpreter, I was having difficulties understanding her history.  Had Covid June 2020. She completed the Covid vaccine 2 months ago.   Intermittent, bilateral, band-like upper abdominal pain that started after having Covid last year.  Intermittently feels a small ball at the xiphoid. Pain is worse when she lies on either side No associated with eating, defecation, or other movements No radiation to the back or shoulders Appetite good. Weight is stable.  Applying VapoRub locally provides some improvement Her daughter told her that her blood  sugar levels are high. Notes heartburn when she eats celery. No dysphagia or odyophagia. Some back pain if she pushes on her abdomen.   She is concerned about having "rheumatia."   Prior evaluation for abdominal pain in the past, but, notes that pain was epigastric pain and she had burning LLQ pain - very different from her recent symptoms. .   H pylori breath test 2017 negative  CT abd/pelvis with contrast 11/25/18: abnormal lung bases with bilateral peripheral patchy and indistinct opacity; ? Mild colitis with thickening of the large bowel CT abd/pelvis without contrast 10/27/19: no acute findings, gas in the bladder  Labs: 05/02/19: normal hepatic function panel except for alk phos 146 09/09/19: normal hepatic function panel except for alk phos 165 10/20/19: normal hepatic function panel except for alk phos 165  No known family history of colon cancer or polyps. No family history of uterine/endometrial cancer, pancreatic cancer or gastric/stomach cancer.   Past Medical History:  Diagnosis Date  . High cholesterol 2013   . Hypertension   . Obesity (BMI 30-39.9)   . Urinary incontinence, mixed     Past Surgical History:  Procedure Laterality Date  . BREAST EXCISIONAL BIOPSY Left   . BREAST LUMPECTOMY    . EYE SURGERY  2012  . TUBAL LIGATION  2000    No current outpatient medications on file.   No current facility-administered medications for this visit.    Allergies as of 12/09/2019  . (No Known Allergies)    Family History  Problem Relation Age of Onset  . Cancer Neg Hx   . Heart disease Neg Hx   .  Diabetes Neg Hx   . Hypertension Neg Hx   . Breast cancer Neg Hx     Social History   Socioeconomic History  . Marital status: Married    Spouse name: Not on file  . Number of children: 5   . Years of education: 2   . Highest education level: Not on file  Occupational History  . Occupation: Unemployed   Tobacco Use  . Smoking status: Never Smoker  . Smokeless  tobacco: Never Used  Vaping Use  . Vaping Use: Never used  Substance and Sexual Activity  . Alcohol use: No  . Drug use: No  . Sexual activity: Yes    Birth control/protection: Surgical  Other Topics Concern  . Not on file  Social History Narrative   Lives with husband and brother in [place].    5 children grown and married.    Cannot read or write in [place].    Husband can read and write in [place] or [place].    Social Determinants of Health   Financial Resource Strain:   . Difficulty of Paying Living Expenses:   Food Insecurity:   . Worried About Charity fundraiser in the Last Year:   . Arboriculturist in the Last Year:   Transportation Needs:   . Film/video editor (Medical):   Marland Kitchen Lack of Transportation (Non-Medical):   Physical Activity:   . Days of Exercise per Week:   . Minutes of Exercise per Session:   Stress:   . Feeling of Stress :   Social Connections:   . Frequency of Communication with Friends and Family:   . Frequency of Social Gatherings with Friends and Family:   . Attends Religious Services:   . Active Member of Clubs or Organizations:   . Attends Archivist Meetings:   Marland Kitchen Marital Status:   Intimate Partner Violence:   . Fear of Current or Ex-Partner:   . Emotionally Abused:   Marland Kitchen Physically Abused:   . Sexually Abused:     Review of Systems: 12 system ROS is negative except as noted above.   Physical Exam: General:   Alert,  well-nourished, pleasant and cooperative in NAD Head:  Normocephalic and atraumatic. Eyes:  Sclera clear, no icterus.   Conjunctiva pink. Ears:  Normal auditory acuity. Nose:  No deformity, discharge,  or lesions. Mouth:  No deformity or lesions.   Neck:  Supple; no masses or thyromegaly. Lungs:  Clear throughout to auscultation.   No wheezes. Heart:  Regular rate and rhythm; no murmurs. Abdomen:  Soft,nontender, nondistended, normal bowel sounds, no rebound or guarding. No hepatosplenomegaly.   Rectal:   Deferred  Msk:  Symmetrical. No boney deformities LAD: No inguinal or umbilical LAD Extremities:  No clubbing or edema. Neurologic:  Alert and  oriented x4;  grossly nonfocal Skin:  Intact without significant lesions or rashes. Psych:  Alert and cooperative. Normal mood and affect.   Cuinn Westerhold L. Tarri Glenn, MD, MPH 12/09/2019, 2:23 PM

## 2019-12-10 ENCOUNTER — Ambulatory Visit: Payer: Self-pay | Admitting: Nurse Practitioner

## 2019-12-12 LAB — ALKALINE PHOSPHATASE ISOENZYMES
Alkaline phosphatase (APISO): 123 U/L (ref 37–153)
Bone Isoenzymes: 48 % (ref 28–66)
Intestinal Isoenzymes: 6 % (ref 1–24)
Liver Isoenzymes: 46 % (ref 25–69)

## 2020-01-28 ENCOUNTER — Ambulatory Visit: Payer: Self-pay

## 2020-01-28 NOTE — Telephone Encounter (Signed)
Using Spanish interpreter [place] # [phone], pt. Reports she thinks she has a fishbone in her left thumb. Has pain in left hand, wrist and thumb."Feels numb at times and I saw pus." No fever. Appointment made for tomorrow. Instructed to go to UC/ED for worsening of symptoms.Verbalizes understanding. Reason for Disposition . [1] Looks infected (spreading redness, pus) AND [2] large red area (> 2 in. or 5 cm)  Answer Assessment - Initial Assessment Questions 1. ONSET: "When did the pain start?"     Started today 2. LOCATION: "Where is the pain located?"     Left hand, wrist and thumb 3. PAIN: "How bad is the pain?" (Scale 1-10; or mild, moderate, severe)   - MILD (1-3): doesn't interfere with normal activities   - MODERATE (4-7): interferes with normal activities (e.g., work or school) or awakens from sleep   - SEVERE (8-10): excruciating pain, unable to use hand at all     6 4. WORK OR EXERCISE: "Has there been any recent work or exercise that involved this part of the body?"     No 5. CAUSE: "What do you think is causing the pain?"     Fish bone in thumb 6. AGGRAVATING FACTORS: "What makes the pain worse?" (e.g., using computer)     No 7. OTHER SYMPTOMS: "Do you have any other symptoms?" (e.g., neck pain, swelling, rash, numbness, fever)     Numb 8. PREGNANCY: "Is there any chance you are pregnant?" "When was your last menstrual period?"     No  Protocols used: HAND AND WRIST PAIN-A-AH

## 2020-01-29 ENCOUNTER — Encounter: Payer: Self-pay | Admitting: Physician Assistant

## 2020-01-29 ENCOUNTER — Other Ambulatory Visit: Payer: Self-pay

## 2020-01-29 ENCOUNTER — Ambulatory Visit: Payer: Self-pay | Attending: Physician Assistant | Admitting: Physician Assistant

## 2020-01-29 ENCOUNTER — Ambulatory Visit (HOSPITAL_COMMUNITY)
Admission: RE | Admit: 2020-01-29 | Discharge: 2020-01-29 | Disposition: A | Discharge status: Home / Self Care | Payer: Self-pay | Source: Ambulatory Visit | Attending: Physician Assistant | Admitting: Physician Assistant

## 2020-01-29 VITALS — BP 134/85 | HR 62 | Temp 97.7°F | Ht 60.0 in | Wt 164.0 lb

## 2020-01-29 DIAGNOSIS — M79644 Pain in right finger(s): Secondary | ICD-10-CM

## 2020-01-29 DIAGNOSIS — M79642 Pain in left hand: Secondary | ICD-10-CM

## 2020-01-29 DIAGNOSIS — Z789 Other specified health status: Secondary | ICD-10-CM

## 2020-01-29 DIAGNOSIS — M795 Residual foreign body in soft tissue: Secondary | ICD-10-CM

## 2020-01-29 MED ORDER — DOXYCYCLINE HYCLATE 100 MG PO TABS: 100.0000 mg | ORAL_TABLET | Freq: Two times a day (BID) | ORAL | 0 refills | Status: DC

## 2020-01-29 MED ORDER — MUPIROCIN 2 % EX OINT: 1.0000 "application " | TOPICAL_OINTMENT | Freq: Two times a day (BID) | CUTANEOUS | 0 refills | Status: DC

## 2020-01-29 MED FILL — MUPIROCIN 2% OINTMENT: 2 | 7 days supply | Qty: 22 | Fill #0

## 2020-01-29 MED FILL — ?DOXYCYCLINE HYCLATE 100 MG: 100 | 10 days supply | Qty: 20 | Fill #0

## 2020-01-29 NOTE — Patient Instructions (Signed)
If the area worsens go to the emergency department.

## 2020-01-29 NOTE — Progress Notes (Signed)
Sheri Mann, is a 54 y.o. female  DPO:[id number]  ERX:[id number]  DOB - Oct 31, 1965  Subjective:  Chief Complaint and HPI: Sheri Mann is a 54 y.o. female here today with longstanding L hand pain and also L thumb pain and redness since boning a frozen fish 01/09/2020 and thinks she may have a bone in her finger from the fish.  No fever.  No swelling.    Her L hand has been causing her pain for some time.  Gets better with movement but stiff and sore at times.    She is R hand dominant.    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   ROS:   Constitutional:  No f/c, No night sweats, No unexplained weight loss. EENT:  No vision changes, No blurry vision, No hearing changes. No mouth, throat, or ear problems.  Respiratory: No cough, No SOB Cardiac: No CP, no palpitations GI:  No abd pain, No N/V/D. GU: No Urinary s/sx Musculoskeletal: see above Neuro: No headache, no dizziness, no motor weakness.  Skin: No rash Endocrine:  No polydipsia. No polyuria.  Psych: Denies SI/HI  No problems updated.  ALLERGIES: No Known Allergies  PAST MEDICAL HISTORY: Past Medical History:  Diagnosis Date  . High cholesterol 2013   . Hypertension   . Obesity (BMI 30-39.9)   . Urinary incontinence, mixed     MEDICATIONS AT HOME: Prior to Admission medications   Medication Sig Start Date End Date Taking? Authorizing Provider  dicyclomine (BENTYL) 20 MG tablet Take 1 tablet (20 mg total) by mouth in the morning, at noon, in the evening, and at bedtime. 12/09/19  Yes Tressia Danas, MD  doxycycline (VIBRA-TABS) 100 MG tablet Take 1 tablet (100 mg total) by mouth 2 (two) times daily. 01/29/20   Anders Simmonds, PA-C  mupirocin ointment (BACTROBAN) 2 % Apply 1 application topically 2 (two) times daily. To thumb 01/29/20   Anders Simmonds, PA-C     Objective:  EXAM:   Vitals:   01/29/20 1030  BP: 134/85  Pulse: 62  Temp: 97.7 F (36.5 C)  TempSrc: Temporal  SpO2: 99%   Weight: 164 lb (74.4 kg)  Height: 5' (1.524 m)    General appearance : A&OX3. NAD. Non-toxic-appearing HEENT: Atraumatic and Normocephalic.  PERRLA. EOM intact.   Chest/Lungs:  Breathing-non-labored, Good air entry bilaterally, breath sounds normal without rales, rhonchi, or wheezing  CVS: S1 S2 regular, no murmurs, gallops, rubs  L and R hand examined.  L thumb-ulnar side with denuded/sloughed layer of skin exposing a minimally erythematous layer of skin that is TTP.  There is no felon or paronychia.  There is no induration or fluctuance.  Normal ROM.  N-V intact.  L hand with overall =grip, S&ROM to R Neurology:  CN II-XII grossly intact, Non focal.   Psych:  TP linear. J/I WNL. Normal speech. Appropriate eye contact and affect.  Skin:  No Rash  Data Review Lab Results  Component Value Date   HGBA1C 6.0 (H) 09/09/2019   HGBA1C 5.90 01/28/2015     Assessment & Plan   1. Foreign body (FB) in soft tissue Unclear if retained FB but does not appear angry and infected;  Just slightly red and tender.   - doxycycline (VIBRA-TABS) 100 MG tablet; Take 1 tablet (100 mg total) by mouth 2 (two) times daily.  Dispense: 20 tablet; Refill: 0 - mupirocin ointment (BACTROBAN) 2 %; Apply 1 application topically 2 (two) times daily. To thumb  Dispense: 22 g;  Refill: 0 - DG Hand Complete Left; Future - Ambulatory referral to Hand Surgery  2. Language barrier [employer] interpreters "Sheri Mann" used and additional time performing visit was required.   3. Thumb pain, right - DG Hand Complete Left; Future - Ambulatory referral to Hand Surgery  4. Hand pain, left Suspect arthritis - DG Hand Complete Left; Future - Ambulatory referral to Hand Surgery  Patient have been counseled extensively about nutrition and exercise  Return if symptoms worsen or fail to improve.  The patient was given clear instructions to go to ER or return to medical center if symptoms don't improve, worsen or new problems  develop. The patient verbalized understanding. The patient was told to call to get lab results if they haven't heard anything in the next week.     Georgian Co, PA-C [facility] [place], [place] [phone]   01/29/2020, 10:52 AMPatient ID: [name], female   DOB: Apr 07, 1966, 54 y.o.   MRN: [id number]

## 2020-01-29 NOTE — Telephone Encounter (Signed)
Pt. Was seen for an OV for her finger pain.

## 2020-02-06 ENCOUNTER — Encounter: Payer: Self-pay | Admitting: Gastroenterology

## 2020-02-06 ENCOUNTER — Ambulatory Visit: Payer: Self-pay | Admitting: Physician Assistant

## 2020-03-12 ENCOUNTER — Ambulatory Visit: Payer: Self-pay | Admitting: Nurse Practitioner

## 2020-04-20 ENCOUNTER — Ambulatory Visit (HOSPITAL_COMMUNITY)
Admission: RE | Admit: 2020-04-20 | Discharge: 2020-04-20 | Disposition: A | Discharge status: Home / Self Care | Payer: Self-pay | Source: Ambulatory Visit | Attending: Nurse Practitioner | Admitting: Nurse Practitioner

## 2020-04-20 ENCOUNTER — Ambulatory Visit: Payer: Self-pay | Attending: Nurse Practitioner | Admitting: Nurse Practitioner

## 2020-04-20 ENCOUNTER — Other Ambulatory Visit: Payer: Self-pay

## 2020-04-20 ENCOUNTER — Other Ambulatory Visit: Payer: Self-pay | Admitting: Nurse Practitioner

## 2020-04-20 ENCOUNTER — Encounter: Payer: Self-pay | Admitting: Nurse Practitioner

## 2020-04-20 VITALS — BP 126/82 | HR 73 | Temp 97.7°F | Ht 60.0 in | Wt 165.0 lb

## 2020-04-20 DIAGNOSIS — M25552 Pain in left hip: Secondary | ICD-10-CM

## 2020-04-20 DIAGNOSIS — Z1211 Encounter for screening for malignant neoplasm of colon: Secondary | ICD-10-CM

## 2020-04-20 DIAGNOSIS — R399 Unspecified symptoms and signs involving the genitourinary system: Secondary | ICD-10-CM

## 2020-04-20 DIAGNOSIS — J302 Other seasonal allergic rhinitis: Secondary | ICD-10-CM

## 2020-04-20 DIAGNOSIS — Z1231 Encounter for screening mammogram for malignant neoplasm of breast: Secondary | ICD-10-CM

## 2020-04-20 DIAGNOSIS — K219 Gastro-esophageal reflux disease without esophagitis: Secondary | ICD-10-CM

## 2020-04-20 DIAGNOSIS — Z23 Encounter for immunization: Secondary | ICD-10-CM

## 2020-04-20 DIAGNOSIS — R1013 Epigastric pain: Secondary | ICD-10-CM

## 2020-04-20 DIAGNOSIS — E782 Mixed hyperlipidemia: Secondary | ICD-10-CM

## 2020-04-20 DIAGNOSIS — R7303 Prediabetes: Secondary | ICD-10-CM

## 2020-04-20 LAB — POCT URINALYSIS DIP (CLINITEK)
Bilirubin, UA: NEGATIVE
Glucose, UA: NEGATIVE mg/dL
Leukocytes, UA: NEGATIVE
Nitrite, UA: NEGATIVE
POC PROTEIN,UA: NEGATIVE
Spec Grav, UA: 1.025 (ref 1.010–1.025)
Urobilinogen, UA: 0.2 E.U./dL
pH, UA: 5.5 (ref 5.0–8.0)

## 2020-04-20 LAB — POCT GLYCOSYLATED HEMOGLOBIN (HGB A1C): Hemoglobin A1C: 5.9 % — AB (ref 4.0–5.6)

## 2020-04-20 LAB — GLUCOSE, POCT (MANUAL RESULT ENTRY): POC Glucose: 155 mg/dl — AB (ref 70–99)

## 2020-04-20 MED ORDER — FAMOTIDINE 20 MG PO TABS: 20.0000 mg | ORAL_TABLET | Freq: Every day | ORAL | 0 refills | Status: DC

## 2020-04-20 MED ORDER — TAMSULOSIN HCL 0.4 MG PO CAPS: 0.4000 mg | ORAL_CAPSULE | Freq: Every day | ORAL | 0 refills | Status: DC

## 2020-04-20 MED ORDER — MELOXICAM 7.5 MG PO TABS: 7.5000 mg | ORAL_TABLET | Freq: Every day | ORAL | 0 refills | Status: DC

## 2020-04-20 MED FILL — ?FAMOTIDINE 20MG TABLET: 20 | 30 days supply | Qty: 30 | Fill #0

## 2020-04-20 MED FILL — TAMSULOSIN HCL 0.4 MG CAP: 0.4 | 14 days supply | Qty: 14 | Fill #0

## 2020-04-20 MED FILL — MELOXICAM 7.5 MG TABLET: 7.5 | 30 days supply | Qty: 30 | Fill #0

## 2020-04-20 NOTE — Progress Notes (Signed)
Assessment & Plan:  Sheri Mann was seen today for dysuria.  Diagnoses and all orders for this visit:  Gastroesophageal reflux disease without esophagitis -     CMP14+EGFR -     famotidine (PEPCID) 20 MG tablet; Take 1 tablet (20 mg total) by mouth at bedtime. INSTRUCTIONS: Avoid GERD Triggers: acidic, spicy or fried foods, caffeine, coffee, sodas,  alcohol and chocolate.   UTI symptoms -     POCT URINALYSIS DIP (CLINITEK) -     Urinalysis, Complete -     DG Abd 1 View; Future -     tamsulosin (FLOMAX) 0.4 MG CAPS capsule; Take 1 capsule (0.4 mg total) by mouth daily for 14 days.  Left hip pain -     meloxicam (MOBIC) 7.5 MG tablet; Take 1 tablet (7.5 mg total) by mouth daily. For left leg pain -     DG Hip Unilat W OR W/O Pelvis 2-3 Views Left; Future Work on losing weight to help reduce joint pain. May alternate with heat and ice application for pain relief. May also alternate with acetaminophen as prescribed pain relief. Other alternatives include massage, acupuncture and water aerobics.  You must stay active and avoid a sedentary lifestyle.  Breast cancer screening by mammogram -     MM 3D SCREEN BREAST BILATERAL; Future  Colon cancer screening -     Fecal occult blood, imunochemical  Seasonal allergies -     Glucose (CBG) -     HgB A1c  Hyperlipemia, mixed -     Lipid panel INSTRUCTIONS: Work on a low fat, heart healthy diet and participate in regular aerobic exercise program by working out at least 150 minutes per week; 5 days a week-30 minutes per day. Avoid red meat/beef/steak,  fried foods. junk foods, sodas, sugary drinks, unhealthy snacking, alcohol and smoking.  Drink at least 80 oz of water per day and monitor your carbohydrate intake daily.    Need for immunization against influenza -     Flu Vaccine QUAD 36+ mos IM  Prediabetes -     Glucose (CBG) -     HgB A1c    Patient has been counseled on age-appropriate routine health concerns for screening and  prevention. These are reviewed and up-to-date. Referrals have been placed accordingly. Immunizations are up-to-date or declined.    Subjective:   Chief Complaint  Patient presents with  . Dysuria    Pt. stated it hurts after she urinates and her left kidney side hurts.    HPI Sheri Mann 54 y.o. female presents to office today with complaints of UTI symptoms. VRI was used to communicate directly with patient for the entire encounter including providing detailed patient instructions.   Urinary Tract Infection: Patient complains of suprapubic pressure and pain after voiding She has had symptoms for several weeks. Patient also complains of back pain. Patient denies fever and vaginal discharge. Patient does not have a history of recurrent UTI.  Patient does not have a history of pyelonephritis.     Review of Systems  Constitutional: Negative for fever, malaise/fatigue and weight loss.  HENT: Negative.  Negative for nosebleeds.   Eyes: Negative.  Negative for blurred vision, double vision and photophobia.  Respiratory: Negative.  Negative for cough and shortness of breath.   Cardiovascular: Negative.  Negative for chest pain, palpitations and leg swelling.  Gastrointestinal: Positive for heartburn. Negative for nausea and vomiting.  Genitourinary: Positive for dysuria and flank pain.  Musculoskeletal: Positive for back pain and  joint pain (left hip). Negative for myalgias.  Neurological: Negative.  Negative for dizziness, focal weakness, seizures and headaches.  Psychiatric/Behavioral: Negative.  Negative for suicidal ideas.    Past Medical History:  Diagnosis Date  . High cholesterol 2013   . Hypertension   . Obesity (BMI 30-39.9)   . Urinary incontinence, mixed     Past Surgical History:  Procedure Laterality Date  . BREAST EXCISIONAL BIOPSY Left   . BREAST LUMPECTOMY    . EYE SURGERY  2012  . TUBAL LIGATION  2000    Family History  Problem Relation Age of  Onset  . Cancer Neg Hx   . Heart disease Neg Hx   . Diabetes Neg Hx   . Hypertension Neg Hx   . Breast cancer Neg Hx     Social History Reviewed with no changes to be made today.   Outpatient Medications Prior to Visit  Medication Sig Dispense Refill  . dicyclomine (BENTYL) 20 MG tablet Take 1 tablet (20 mg total) by mouth in the morning, at noon, in the evening, and at bedtime. 50 tablet 3  . mupirocin ointment (BACTROBAN) 2 % Apply 1 application topically 2 (two) times daily. To thumb 22 g 0  . doxycycline (VIBRA-TABS) 100 MG tablet Take 1 tablet (100 mg total) by mouth 2 (two) times daily. 20 tablet 0   No facility-administered medications prior to visit.    No Known Allergies     Objective:    BP 126/82 (BP Location: Left Arm, Patient Position: Sitting, Cuff Size: Normal)   Pulse 73   Temp 97.7 F (36.5 C) (Temporal)   Ht 5' (1.524 m)   Wt 165 lb (74.8 kg)   LMP 11/11/2016 (Exact Date)   SpO2 98%   BMI 32.22 kg/m  Wt Readings from Last 3 Encounters:  04/20/20 165 lb (74.8 kg)  01/29/20 164 lb (74.4 kg)  12/09/19 165 lb 4 oz (75 kg)    Physical Exam Vitals and nursing note reviewed.  Constitutional:      Appearance: She is well-developed.  HENT:     Head: Normocephalic and atraumatic.  Cardiovascular:     Rate and Rhythm: Normal rate and regular rhythm.     Heart sounds: Normal heart sounds. No murmur heard.  No friction rub. No gallop.   Pulmonary:     Effort: Pulmonary effort is normal. No tachypnea or respiratory distress.     Breath sounds: Normal breath sounds. No decreased breath sounds, wheezing, rhonchi or rales.  Chest:     Chest wall: No tenderness.  Abdominal:     General: Bowel sounds are normal.     Palpations: Abdomen is soft.     Tenderness: There is abdominal tenderness in the left lower quadrant. There is left CVA tenderness.  Musculoskeletal:     Cervical back: Normal range of motion.     Lumbar back: Positive left straight leg raise  test.     Left hip: Tenderness present.  Skin:    General: Skin is warm and dry.  Neurological:     Mental Status: She is alert and oriented to person, place, and time.     Coordination: Coordination normal.  Psychiatric:        Behavior: Behavior normal. Behavior is cooperative.        Thought Content: Thought content normal.        Judgment: Judgment normal.          Patient has been counseled extensively about  nutrition and exercise as well as the importance of adherence with medications and regular follow-up. The patient was given clear instructions to go to ER or return to medical center if symptoms don't improve, worsen or new problems develop. The patient verbalized understanding.   Follow-up: Return in about 4 weeks (around 05/18/2020) for TELE left hip/abd pain.   Gildardo Pounds, FNP-BC [facility] [place], [place]   04/20/2020, 4:16 PM

## 2020-04-21 LAB — CMP14+EGFR
ALT: 18 IU/L (ref 0–32)
AST: 18 IU/L (ref 0–40)
Albumin/Globulin Ratio: 1.6 (ref 1.2–2.2)
Albumin: 4.5 g/dL (ref 3.8–4.9)
Alkaline Phosphatase: 142 IU/L — ABNORMAL HIGH (ref 44–121)
BUN/Creatinine Ratio: 16 (ref 9–23)
BUN: 14 mg/dL (ref 6–24)
Bilirubin Total: 0.3 mg/dL (ref 0.0–1.2)
CO2: 28 mmol/L (ref 20–29)
Calcium: 10 mg/dL (ref 8.7–10.2)
Chloride: 103 mmol/L (ref 96–106)
Creatinine, Ser: 0.86 mg/dL (ref 0.57–1.00)
GFR calc Af Amer: 90 mL/min/{1.73_m2} (ref >59)
GFR calc non Af Amer: 78 mL/min/{1.73_m2} (ref >59)
Globulin, Total: 2.9 g/dL (ref 1.5–4.5)
Glucose: 112 mg/dL — ABNORMAL HIGH (ref 65–99)
Potassium: 4.3 mmol/L (ref 3.5–5.2)
Sodium: 140 mmol/L (ref 134–144)
Total Protein: 7.4 g/dL (ref 6.0–8.5)

## 2020-04-21 LAB — MICROSCOPIC EXAMINATION
Bacteria, UA: NONE SEEN
Casts: NONE SEEN /lpf
Epithelial Cells (non renal): NONE SEEN /hpf (ref 0–10)

## 2020-04-21 LAB — URINALYSIS, COMPLETE
Bilirubin, UA: NEGATIVE
Glucose, UA: NEGATIVE
Leukocytes,UA: NEGATIVE
Nitrite, UA: NEGATIVE
Protein,UA: NEGATIVE
RBC, UA: NEGATIVE
Specific Gravity, UA: 1.025 (ref 1.005–1.030)
Urobilinogen, Ur: 0.2 mg/dL (ref 0.2–1.0)
pH, UA: 5.5 (ref 5.0–7.5)

## 2020-04-21 LAB — LIPID PANEL
Chol/HDL Ratio: 4.5 ratio — ABNORMAL HIGH (ref 0.0–4.4)
Cholesterol, Total: 211 mg/dL — ABNORMAL HIGH (ref 100–199)
HDL: 47 mg/dL (ref >39)
LDL Chol Calc (NIH): 138 mg/dL — ABNORMAL HIGH (ref 0–99)
Triglycerides: 147 mg/dL (ref 0–149)
VLDL Cholesterol Cal: 26 mg/dL (ref 5–40)

## 2020-04-23 ENCOUNTER — Other Ambulatory Visit: Payer: Self-pay | Admitting: Nurse Practitioner

## 2020-04-23 MED ORDER — METHOCARBAMOL 500 MG PO TABS: 500.0000 mg | ORAL_TABLET | Freq: Three times a day (TID) | ORAL | 1 refills | Status: DC | PRN

## 2020-04-23 MED FILL — METHOCARBAMOL 500 MG TABS: 500 | 20 days supply | Qty: 60 | Fill #0

## 2020-04-24 LAB — FECAL OCCULT BLOOD, IMMUNOCHEMICAL: Fecal Occult Bld: NEGATIVE

## 2020-05-20 ENCOUNTER — Ambulatory Visit
Admission: RE | Admit: 2020-05-20 | Discharge: 2020-05-20 | Disposition: A | Discharge status: Home / Self Care | Payer: No Typology Code available for payment source | Source: Ambulatory Visit | Attending: Nurse Practitioner | Admitting: Nurse Practitioner

## 2020-05-20 ENCOUNTER — Other Ambulatory Visit: Payer: Self-pay

## 2020-05-20 DIAGNOSIS — Z1231 Encounter for screening mammogram for malignant neoplasm of breast: Secondary | ICD-10-CM

## 2020-06-09 ENCOUNTER — Encounter: Payer: Self-pay | Admitting: Nurse Practitioner

## 2020-06-09 ENCOUNTER — Ambulatory Visit: Payer: Self-pay | Attending: Nurse Practitioner | Admitting: Nurse Practitioner

## 2020-06-09 ENCOUNTER — Other Ambulatory Visit: Payer: Self-pay

## 2020-06-09 ENCOUNTER — Other Ambulatory Visit: Payer: Self-pay | Admitting: Nurse Practitioner

## 2020-06-09 DIAGNOSIS — M25552 Pain in left hip: Secondary | ICD-10-CM

## 2020-06-09 MED ORDER — GABAPENTIN 300 MG PO CAPS: 300.0000 mg | ORAL_CAPSULE | Freq: Every day | ORAL | 1 refills | Status: DC

## 2020-06-09 MED ORDER — MELOXICAM 15 MG PO TABS: 15.0000 mg | ORAL_TABLET | Freq: Every day | ORAL | 0 refills | Status: DC

## 2020-06-09 MED FILL — GABAPENTIN 300 MG CAPSULE: 300 | 30 days supply | Qty: 30 | Fill #0

## 2020-06-09 MED FILL — MELOXICAM 15 MG TABLET: 15 | 30 days supply | Qty: 30 | Fill #0

## 2020-06-09 NOTE — Progress Notes (Signed)
Virtual Visit via Telephone Note Due to national recommendations of social distancing due to COVID 19, telehealth visit is felt to be most appropriate for this patient at this time.  I discussed the limitations, risks, security and privacy concerns of performing an evaluation and management service by telephone and the availability of in person appointments. I also discussed with the patient that there may be a patient responsible charge related to this service. The patient expressed understanding and agreed to proceed.    I connected with [name] on 06/09/20  at   1:50 PM EST  EDT by telephone and verified that I am speaking with the correct person using two identifiers.   Consent I discussed the limitations, risks, security and privacy concerns of performing an evaluation and management service by telephone and the availability of in person appointments. I also discussed with the patient that there may be a patient responsible charge related to this service. The patient expressed understanding and agreed to proceed.   Location of Patient: Private Residence    [facility] of Provider[facility] [place] [facility]    Persons participating in Telemedicine visit: Bertram Denver FNP-BC [facility] [place] CMA Alnita Jacki Cones  Spanish Interpreter ID# [id number]   History of Present Illness: Telemedicine visit for: left Hip pain  Associated symptoms: none. She was started on meloxicam 7.5 mg daily last month for left hip pain. Aggravating factors: sitting, lying down for long periods of time.  Also notes associated back pain when she is experiecing the hip pain as well as left sciatica. Pain described as aching and burning. Onset of symptoms since last year. Hip xray was negative for any acute process.     Past Medical History:  Diagnosis Date   High cholesterol 2013    Hypertension    Obesity (BMI 30-39.9)    Urinary incontinence, mixed     Past Surgical  History:  Procedure Laterality Date   BREAST EXCISIONAL BIOPSY Left    BREAST LUMPECTOMY     EYE SURGERY  2012   TUBAL LIGATION  2000    Family History  Problem Relation Age of Onset   Cancer Neg Hx    Heart disease Neg Hx    Diabetes Neg Hx    Hypertension Neg Hx    Breast cancer Neg Hx     Social History   Socioeconomic History   Marital status: Married    Spouse name: Not on file   Number of children: 5    Years of education: 2    Highest education level: Not on file  Occupational History   Occupation: Unemployed   Tobacco Use   Smoking status: Never Smoker   Smokeless tobacco: Never Used  Building services engineer Use: Never used  Substance and Sexual Activity   Alcohol use: No   Drug use: No   Sexual activity: Yes    Birth control/protection: Surgical  Other Topics Concern   Not on file  Social History Narrative   Lives with husband and brother in [place].    5 children grown and married.    Cannot read or write in spanish.    Husband can read and write in spanish or english.    Social Determinants of Health   Financial Resource Strain: Not on file  Food Insecurity: Not on file  Transportation Needs: Not on file  Physical Activity: Not on file  Stress: Not on file  Social Connections: Not on file  Observations/Objective: Awake, alert and oriented x 3   Review of Systems  Constitutional: Negative for fever, malaise/fatigue and weight loss.  HENT: Negative.  Negative for nosebleeds.   Eyes: Negative.  Negative for blurred vision, double vision and photophobia.  Respiratory: Negative.  Negative for cough and shortness of breath.   Cardiovascular: Negative.  Negative for chest pain, palpitations and leg swelling.  Gastrointestinal: Negative.  Negative for heartburn, nausea and vomiting.  Musculoskeletal: Positive for joint pain. Negative for myalgias.  Neurological: Positive for sensory change. Negative for dizziness, focal weakness,  seizures and headaches.  Psychiatric/Behavioral: Negative.  Negative for suicidal ideas.    Assessment and Plan: Zaiah was seen today for follow-up.  Diagnoses and all orders for this visit:  Left hip pain -     gabapentin (NEURONTIN) 300 MG capsule; Take 1 capsule (300 mg total) by mouth at bedtime. For left hip pain -     meloxicam (MOBIC) 15 MG tablet; Take 1 tablet (15 mg total) by mouth daily. For left leg pain     Follow Up Instructions Return in about 4 weeks (around 07/07/2020).     I discussed the assessment and treatment plan with the patient. The patient was provided an opportunity to ask questions and all were answered. The patient agreed with the plan and demonstrated an understanding of the instructions.   The patient was advised to call back or seek an in-person evaluation if the symptoms worsen or if the condition fails to improve as anticipated.  I provided 15 minutes of non-face-to-face time during this encounter including median intraservice time, reviewing previous notes, labs, imaging, medications and explaining diagnosis and management.  Claiborne Rigg, FNP-BC

## 2020-06-23 ENCOUNTER — Ambulatory Visit: Payer: Self-pay | Attending: Nurse Practitioner

## 2020-06-23 ENCOUNTER — Other Ambulatory Visit: Payer: Self-pay

## 2020-07-19 ENCOUNTER — Other Ambulatory Visit: Payer: Self-pay | Admitting: Nurse Practitioner

## 2020-07-19 ENCOUNTER — Other Ambulatory Visit: Payer: Self-pay

## 2020-07-19 ENCOUNTER — Ambulatory Visit: Payer: Self-pay | Admitting: Nurse Practitioner

## 2020-07-19 DIAGNOSIS — K219 Gastro-esophageal reflux disease without esophagitis: Secondary | ICD-10-CM

## 2020-07-19 MED ORDER — FAMOTIDINE 20 MG PO TABS: 20.0000 mg | ORAL_TABLET | Freq: Every day | ORAL | 2 refills | Status: DC

## 2020-07-21 MED FILL — FAMOTIDINE 20 MG TABS: 20 | 30 days supply | Qty: 30 | Fill #0

## 2020-09-19 ENCOUNTER — Other Ambulatory Visit: Payer: Self-pay

## 2020-09-19 MED ORDER — FAMOTIDINE 20 MG PO TABS: 20.0000 mg | ORAL_TABLET | Freq: Every day | ORAL | 11 refills | Status: DC

## 2020-10-22 ENCOUNTER — Ambulatory Visit: Payer: No Typology Code available for payment source | Admitting: Nurse Practitioner

## 2020-11-05 ENCOUNTER — Ambulatory Visit: Payer: Self-pay | Attending: Nurse Practitioner | Admitting: Nurse Practitioner

## 2020-11-05 ENCOUNTER — Other Ambulatory Visit: Payer: Self-pay

## 2020-11-05 ENCOUNTER — Encounter: Payer: Self-pay | Admitting: Nurse Practitioner

## 2020-11-05 VITALS — BP 122/78 | HR 71 | Ht 60.0 in | Wt 167.6 lb

## 2020-11-05 DIAGNOSIS — E785 Hyperlipidemia, unspecified: Secondary | ICD-10-CM

## 2020-11-05 DIAGNOSIS — M25552 Pain in left hip: Secondary | ICD-10-CM

## 2020-11-05 DIAGNOSIS — R7303 Prediabetes: Secondary | ICD-10-CM

## 2020-11-05 DIAGNOSIS — Z130 Encounter for screening for diseases of the blood and blood-forming organs and certain disorders involving the immune mechanism: Secondary | ICD-10-CM

## 2020-11-05 LAB — POCT GLYCOSYLATED HEMOGLOBIN (HGB A1C): HbA1c, POC (prediabetic range): 5.7 % (ref 5.7–6.4)

## 2020-11-05 MED ORDER — MELOXICAM 7.5 MG PO TABS
7.5000 mg | ORAL_TABLET | Freq: Every day | ORAL | 1 refills | Status: AC
  Filled 2020-11-05: qty 30, 30d supply, fill #0

## 2020-11-05 MED ORDER — METHOCARBAMOL 500 MG PO TABS
ORAL_TABLET | Freq: Three times a day (TID) | ORAL | 1 refills | Status: DC | PRN
Start: 2020-11-05 — End: 2021-06-29
  Filled 2020-11-05: qty 60, 20d supply, fill #0

## 2020-11-05 NOTE — Progress Notes (Signed)
Assessment & Plan:  Sheri Mann was seen today for prediabetes.  Diagnoses and all orders for this visit:  Prediabetes -     POCT glycosylated hemoglobin (Hb A1C) -     CMP14+EGFR Continue blood sugar control as discussed in office today, low carbohydrate diet, and regular physical exercise as tolerated, 150 minutes per week (30 min each day, 5 days per week, or 50 min 3 days per week).   Left hip pain -     methocarbamol (ROBAXIN) 500 MG tablet; TAKE 1 TABLET (500 MG TOTAL) BY MOUTH EVERY 8 (EIGHT) HOURS AS NEEDED FOR MUSCLE SPASMS. -     meloxicam (MOBIC) 7.5 MG tablet; Take 1 tablet (7.5 mg total) by mouth daily. FOR TENSION HEADACHE Work on losing weight to help reduce joint pain. May alternate with heat and ice application for pain relief. May also alternate with acetaminophen  as prescribed pain relief. Other alternatives include massage, acupuncture and water aerobics.  You must stay active and avoid a sedentary lifestyle.  Dyslipidemia, goal LDL below 70 -     Lipid panel INSTRUCTIONS: Work on a low fat, heart healthy diet and participate in regular aerobic exercise program by working out at least 150 minutes per week; 5 days a week-30 minutes per day. Avoid red meat/beef/steak,  fried foods. junk foods, sodas, sugary drinks, unhealthy snacking, alcohol and smoking.  Drink at least 80 oz of water per day and monitor your carbohydrate intake daily.    Screening for deficiency anemia -     CBC    Patient has been counseled on age-appropriate routine health concerns for screening and prevention. These are reviewed and up-to-date. Referrals have been placed accordingly. Immunizations are up-to-date or declined.    Subjective:   Chief Complaint  Patient presents with  . Prediabetes   HPI Sheri Mann 55 y.o. female presents to office today for follow up to prediabetes  VRI was used to communicate directly with patient for the entire encounter including providing  detailed patient instructions.   She has neck pain, shoulder pain and upper back pain. Associated symptoms: headaches  x 1 week. She has been taking tylenol with some relief of her pain. She has tried robaxin which has provided moderate relief of symptoms. Pain is mostly during the day. She does endorse increased stress.   Depression screen [facility] 2/9 11/05/2020 07/30/2019 03/31/2019 06/26/2018 04/18/2018  Decreased Interest 0 0 0 0 0  Down, Depressed, Hopeless 1 0 0 0 0  PHQ - 2 Score 1 0 0 0 0  Altered sleeping 0 0 0 - 0  Tired, decreased energy 1 0 2 - 1  Change in appetite 0 0 0 - -  Feeling bad or failure about yourself  0 0 0 - 0  Trouble concentrating 0 0 0 - 0  Moving slowly or fidgety/restless 0 0 0 - 0  Suicidal thoughts 0 0 0 - 0  PHQ-9 Score 2 0 2 - 1  Some recent data might be hidden   GAD 7 : Generalized Anxiety Score 11/05/2020 07/30/2019 03/31/2019 06/26/2018  Nervous, Anxious, on Edge 0 0 0 -  Control/stop worrying 0 0 0 0  Worry too much - different things 0 0 0 0  Trouble relaxing 1 0 0 0  Restless 0 0 0 0  Easily annoyed or irritable 0 0 0 0  Afraid - awful might happen 0 0 0 0  Total GAD 7 Score 1 0 0 -  Anxiety  Difficulty - - - -   Prediabetes Well controlled with diet only at this time. LDL not at goal.  Lab Results  Component Value Date   HGBA1C 5.7 11/05/2020   Lab Results  Component Value Date   HGBA1C 5.9 (A) 04/20/2020   Lab Results  Component Value Date   LDLCALC 138 (H) 04/20/2020  The 10-year ASCVD risk score Mikey Bussing DC Jr., et al., 2013) is: 1.9%   Values used to calculate the score:     Age: 17 years     Sex: Female     Is Non-Hispanic African American: No     Diabetic: No     Tobacco smoker: No     Systolic Blood Pressure: 956 mmHg     Is BP treated: No     HDL Cholesterol: 47 mg/dL     Total Cholesterol: 211 mg/dL Review of Systems  Constitutional: Negative for fever, malaise/fatigue and weight loss.  HENT: Negative.  Negative for  nosebleeds.   Eyes: Negative.  Negative for blurred vision, double vision and photophobia.  Respiratory: Negative.  Negative for cough and shortness of breath.   Cardiovascular: Negative.  Negative for chest pain, palpitations and leg swelling.  Gastrointestinal: Negative.  Negative for heartburn, nausea and vomiting.  Musculoskeletal: Positive for back pain, joint pain and neck pain. Negative for myalgias.  Neurological: Positive for headaches. Negative for dizziness, focal weakness and seizures.  Psychiatric/Behavioral: Negative.  Negative for suicidal ideas.    Past Medical History:  Diagnosis Date  . High cholesterol 2013   . Hypertension   . Obesity (BMI 30-39.9)   . Urinary incontinence, mixed     Past Surgical History:  Procedure Laterality Date  . BREAST EXCISIONAL BIOPSY Left   . BREAST LUMPECTOMY    . EYE SURGERY  2012  . TUBAL LIGATION  2000    Family History  Problem Relation Age of Onset  . Cancer Neg Hx   . Heart disease Neg Hx   . Diabetes Neg Hx   . Hypertension Neg Hx   . Breast cancer Neg Hx     Social History Reviewed with no changes to be made today.   Outpatient Medications Prior to Visit  Medication Sig Dispense Refill  . gabapentin (NEURONTIN) 300 MG capsule TAKE 1 CAPSULE (300 MG TOTAL) BY MOUTH AT BEDTIME. FOR LEFT HIP PAIN (Patient not taking: Reported on 11/05/2020) 30 capsule 1  . dicyclomine (BENTYL) 20 MG tablet Take 1 tablet (20 mg total) by mouth in the morning, at noon, in the evening, and at bedtime. (Patient not taking: Reported on 11/05/2020) 50 tablet 3  . famotidine (PEPCID) 20 MG tablet TAKE 1 TABLET (20 MG TOTAL) BY MOUTH AT BEDTIME. (Patient not taking: Reported on 11/05/2020) 30 tablet 2  . famotidine (PEPCID) 20 MG tablet TAKE 1 TABLET (20 MG TOTAL) BY MOUTH AT BEDTIME. (Patient not taking: Reported on 11/05/2020) 30 tablet 11  . meloxicam (MOBIC) 15 MG tablet TAKE 1 TABLET (15 MG TOTAL) BY MOUTH DAILY. FOR LEFT LEG PAIN (Patient not  taking: Reported on 11/05/2020) 30 tablet 0  . methocarbamol (ROBAXIN) 500 MG tablet TAKE 1 TABLET (500 MG TOTAL) BY MOUTH EVERY 8 (EIGHT) HOURS AS NEEDED FOR MUSCLE SPASMS. (Patient not taking: Reported on 11/05/2020) 60 tablet 1  . tamsulosin (FLOMAX) 0.4 MG CAPS capsule TAKE 1 CAPSULE (0.4 MG TOTAL) BY MOUTH DAILY FOR 14 DAYS. (Patient not taking: Reported on 11/05/2020) 14 capsule 0   No facility-administered medications prior  to visit.    No Known Allergies     Objective:    BP 122/78   Pulse 71   Ht 5' (1.524 m)   Wt 167 lb 9.6 oz (76 kg)   LMP 11/11/2016 (Exact Date)   SpO2 98%   BMI 32.73 kg/m  Wt Readings from Last 3 Encounters:  11/05/20 167 lb 9.6 oz (76 kg)  04/20/20 165 lb (74.8 kg)  01/29/20 164 lb (74.4 kg)    Physical Exam Vitals and nursing note reviewed.  Constitutional:      Appearance: She is well-developed.  HENT:     Head: Normocephalic and atraumatic.  Cardiovascular:     Rate and Rhythm: Normal rate and regular rhythm.     Heart sounds: Normal heart sounds. No murmur heard. No friction rub. No gallop.   Pulmonary:     Effort: Pulmonary effort is normal. No tachypnea or respiratory distress.     Breath sounds: Normal breath sounds. No decreased breath sounds, wheezing, rhonchi or rales.  Chest:     Chest wall: No tenderness.  Abdominal:     General: Bowel sounds are normal.     Palpations: Abdomen is soft.  Musculoskeletal:        General: Normal range of motion.     Cervical back: Normal range of motion.  Skin:    General: Skin is warm and dry.     Comments: Varicose veins BLE  Neurological:     Mental Status: She is alert and oriented to person, place, and time.     Coordination: Coordination normal.  Psychiatric:        Behavior: Behavior normal. Behavior is cooperative.        Thought Content: Thought content normal.        Judgment: Judgment normal.          Patient has been counseled extensively about nutrition and exercise as  well as the importance of adherence with medications and regular follow-up. The patient was given clear instructions to go to ER or return to medical center if symptoms don't improve, worsen or new problems develop. The patient verbalized understanding.   Follow-up: Return in about 4 weeks (around 12/03/2020) for f/u tele neck and shoulder apin.   Gildardo Pounds, FNP-BC [facility] and [place], [place]   11/05/2020, 10:40 PM

## 2020-11-05 NOTE — Patient Instructions (Addendum)
[employer] in [place] Service options: In-store shopping  Delivery Address: [address], [place], [place] [address] Areas served: [phone]  Open ? Closes 5:30PM Phone: [phone]  [facility] [address]  In [facility] Open ? Closes 6PM  [phone]

## 2020-11-06 LAB — CMP14+EGFR
ALT: 19 IU/L (ref 0–32)
AST: 22 IU/L (ref 0–40)
Albumin/Globulin Ratio: 1.6 (ref 1.2–2.2)
Albumin: 4.4 g/dL (ref 3.8–4.9)
Alkaline Phosphatase: 143 IU/L — ABNORMAL HIGH (ref 44–121)
BUN/Creatinine Ratio: 20 (ref 9–23)
BUN: 14 mg/dL (ref 6–24)
Bilirubin Total: 0.4 mg/dL (ref 0.0–1.2)
CO2: 22 mmol/L (ref 20–29)
Calcium: 9.8 mg/dL (ref 8.7–10.2)
Chloride: 104 mmol/L (ref 96–106)
Creatinine, Ser: 0.7 mg/dL (ref 0.57–1.00)
Globulin, Total: 2.7 g/dL (ref 1.5–4.5)
Glucose: 98 mg/dL (ref 65–99)
Potassium: 4.4 mmol/L (ref 3.5–5.2)
Sodium: 139 mmol/L (ref 134–144)
Total Protein: 7.1 g/dL (ref 6.0–8.5)
eGFR: 103 mL/min/{1.73_m2} (ref >59)

## 2020-11-06 LAB — CBC
Hematocrit: 38.4 % (ref 34.0–46.6)
Hemoglobin: 12.9 g/dL (ref 11.1–15.9)
MCH: 29.5 pg (ref 26.6–33.0)
MCHC: 33.6 g/dL (ref 31.5–35.7)
MCV: 88 fL (ref 79–97)
Platelets: 177 10*3/uL (ref 150–450)
RBC: 4.38 x10E6/uL (ref 3.77–5.28)
RDW: 12.4 % (ref 11.7–15.4)
WBC: 5.2 10*3/uL (ref 3.4–10.8)

## 2020-11-06 LAB — LIPID PANEL
Chol/HDL Ratio: 3.7 ratio (ref 0.0–4.4)
Cholesterol, Total: 179 mg/dL (ref 100–199)
HDL: 49 mg/dL (ref >39)
LDL Chol Calc (NIH): 110 mg/dL — ABNORMAL HIGH (ref 0–99)
Triglycerides: 108 mg/dL (ref 0–149)
VLDL Cholesterol Cal: 20 mg/dL (ref 5–40)

## 2020-11-09 ENCOUNTER — Other Ambulatory Visit: Payer: Self-pay

## 2020-12-17 ENCOUNTER — Encounter: Payer: Self-pay | Admitting: Nurse Practitioner

## 2020-12-17 ENCOUNTER — Other Ambulatory Visit: Payer: Self-pay

## 2020-12-17 ENCOUNTER — Ambulatory Visit: Payer: Self-pay | Attending: Nurse Practitioner | Admitting: Nurse Practitioner

## 2020-12-17 NOTE — Progress Notes (Signed)
Patient states she does not need this appointment today. She denies any pain and states she has no concerns today.

## 2021-01-24 ENCOUNTER — Other Ambulatory Visit: Payer: Self-pay

## 2021-01-24 ENCOUNTER — Ambulatory Visit: Payer: Self-pay | Attending: Nurse Practitioner

## 2021-01-24 ENCOUNTER — Telehealth: Payer: Self-pay | Admitting: Nurse Practitioner

## 2021-01-24 NOTE — Telephone Encounter (Signed)
Pt came to the office to request a referral for a dentist, asap, please follow up

## 2021-01-25 NOTE — Telephone Encounter (Signed)
Pt needing dental referral she has the OC.

## 2021-01-26 ENCOUNTER — Other Ambulatory Visit: Payer: Self-pay | Admitting: Nurse Practitioner

## 2021-01-26 DIAGNOSIS — K089 Disorder of teeth and supporting structures, unspecified: Secondary | ICD-10-CM

## 2021-02-11 ENCOUNTER — Encounter: Payer: Self-pay | Admitting: Nurse Practitioner

## 2021-02-11 ENCOUNTER — Other Ambulatory Visit: Payer: Self-pay

## 2021-02-11 ENCOUNTER — Ambulatory Visit: Payer: Self-pay | Attending: Nurse Practitioner | Admitting: Nurse Practitioner

## 2021-02-11 DIAGNOSIS — M2550 Pain in unspecified joint: Secondary | ICD-10-CM

## 2021-02-11 MED ORDER — GABAPENTIN 300 MG PO CAPS
ORAL_CAPSULE | ORAL | 1 refills | Status: DC
  Filled 2021-02-11: qty 30, 30d supply, fill #0

## 2021-02-11 MED ORDER — HYDROXYZINE HCL 10 MG PO TABS
10.0000 mg | ORAL_TABLET | Freq: Three times a day (TID) | ORAL | 1 refills | Status: DC | PRN
  Filled 2021-02-11: qty 60, 20d supply, fill #0

## 2021-02-11 NOTE — Progress Notes (Signed)
Virtual Visit via Telephone Note Due to national recommendations of social distancing due to COVID 19, telehealth visit is felt to be most appropriate for this patient at this time.  I discussed the limitations, risks, security and privacy concerns of performing an evaluation and management service by telephone and the availability of in person appointments. I also discussed with the patient that there may be a patient responsible charge related to this service. The patient expressed understanding and agreed to proceed.    I connected with [name] on 02/11/21  at   9:50 AM EDT  EDT by telephone and verified that I am speaking with the correct person using two identifiers.  Location of Patient: Private Residence   Location of Provider: [facility]    Persons participating in Telemedicine visit: Bertram Denver FNP-BC Nickola Jacki Cones  Spanish interpreter ID# [phone]   History of Present Illness: Telemedicine visit for: Hip Pain  She was prescribed gabapentin, meloxicam and methocarbamol for  Neck, shoulder, upper back and left hip pain. Associated symptoms: neck pain causing headaches. Treatments tried at home included tylenol with little relief of symptoms. Pain is mostly occurring during the day. Aggravating factors: stress and when she gets nervous or anxious. Currently notes symptoms well controlled.   Past Medical History:  Diagnosis Date   High cholesterol 2013    Hypertension    Obesity (BMI 30-39.9)    Urinary incontinence, mixed     Past Surgical History:  Procedure Laterality Date   BREAST EXCISIONAL BIOPSY Left    BREAST LUMPECTOMY     EYE SURGERY  2012   TUBAL LIGATION  2000    Family History  Problem Relation Age of Onset   Cancer Neg Hx    Heart disease Neg Hx    Diabetes Neg Hx    Hypertension Neg Hx    Breast cancer Neg Hx     Social History   Socioeconomic History   Marital status: Married     Spouse name: Not on file   Number of children: 5    Years of education: 2    Highest education level: Not on file  Occupational History   Occupation: Unemployed   Tobacco Use   Smoking status: Never   Smokeless tobacco: Never  Vaping Use   Vaping Use: Never used  Substance and Sexual Activity   Alcohol use: No   Drug use: No   Sexual activity: Yes    Birth control/protection: Surgical  Other Topics Concern   Not on file  Social History Narrative   Lives with husband and brother in [place].    5 children grown and married.    Cannot read or write in spanish.    Husband can read and write in spanish or english.    Social Determinants of Health   Financial Resource Strain: Not on file  Food Insecurity: Not on file  Transportation Needs: Not on file  Physical Activity: Not on file  Stress: Not on file  Social Connections: Not on file     Observations/Objective: Awake, alert and oriented x 3   Review of Systems  Constitutional:  Negative for fever, malaise/fatigue and weight loss.  HENT: Negative.  Negative for nosebleeds.   Eyes: Negative.  Negative for blurred vision, double vision and photophobia.  Respiratory: Negative.  Negative for cough and shortness of breath.   Cardiovascular: Negative.  Negative for chest pain, palpitations and leg swelling.  Gastrointestinal: Negative.  Negative for  heartburn, nausea and vomiting.  Musculoskeletal:  Positive for back pain and myalgias.  Neurological: Negative.  Negative for dizziness, focal weakness, seizures and headaches.  Psychiatric/Behavioral: Negative.  Negative for suicidal ideas.    Assessment and Plan: Diagnoses and all orders for this visit:  Arthralgia of multiple joints -     hydrOXYzine (ATARAX/VISTARIL) 10 MG tablet; Take 1 tablet (10 mg total) by mouth 3 (three) times daily as needed. FOR ANXIETY -     gabapentin (NEURONTIN) 300 MG capsule; TAKE 1 CAPSULE (300 MG TOTAL) BY MOUTH AT BEDTIME. FOR LEFT HIP PAIN   Work on losing weight to help reduce joint pain. May alternate with heat and ice application for pain relief. May also alternate with acetaminophen and Ibuprofen as prescribed pain relief. Other alternatives include massage, acupuncture and water aerobics.  You must stay active and avoid a sedentary lifestyle.   Follow Up Instructions Return if symptoms worsen or fail to improve.     I discussed the assessment and treatment plan with the patient. The patient was provided an opportunity to ask questions and all were answered. The patient agreed with the plan and demonstrated an understanding of the instructions.   The patient was advised to call back or seek an in-person evaluation if the symptoms worsen or if the condition fails to improve as anticipated.  I provided 8 minutes of non-face-to-face time during this encounter including median intraservice time, reviewing previous notes, labs, imaging, medications and explaining diagnosis and management.  Claiborne Rigg, FNP-BC

## 2021-02-14 ENCOUNTER — Other Ambulatory Visit: Payer: Self-pay

## 2021-03-30 ENCOUNTER — Ambulatory Visit (INDEPENDENT_AMBULATORY_CARE_PROVIDER_SITE_OTHER): Payer: Self-pay | Admitting: Nurse Practitioner

## 2021-03-30 ENCOUNTER — Encounter: Payer: Self-pay | Admitting: Nurse Practitioner

## 2021-03-30 ENCOUNTER — Other Ambulatory Visit: Payer: Self-pay

## 2021-03-30 VITALS — BP 142/88 | HR 67 | Resp 18

## 2021-03-30 DIAGNOSIS — J0110 Acute frontal sinusitis, unspecified: Secondary | ICD-10-CM

## 2021-03-30 MED ORDER — AMOXICILLIN-POT CLAVULANATE 875-125 MG PO TABS
1.0000 | ORAL_TABLET | Freq: Two times a day (BID) | ORAL | 0 refills | Status: AC
  Filled 2021-03-30: qty 14, 7d supply, fill #0

## 2021-03-30 NOTE — Progress Notes (Signed)
@  Patient ID: Sheri Mann, female    DOB: 1965/07/19, 55 y.o.   MRN: [id number]  Chief Complaint  Patient presents with   Headache    Right eye redness    Referring provider: Claiborne Rigg, NP    HPI  Patient presents today for acute visit.  Patient states that she has been having left-sided sinus pressure and pain for the past few days.  She has also had left-sided ear pain.  Her left is oriented today.  She denies any drainage out of her.  This does not look like conjunctivitis.  We discussed that it would probably be best that she follows up with her eye doctor soon as possible.  But we will start her on antibiotics for possible left-sided sinus infection. Denies f/c/s, n/v/d, hemoptysis, PND, chest pain or edema.    No Known Allergies  Immunization History  Administered Date(s) Administered   Influenza,inj,Quad PF,6+ Mos 04/09/2014, 04/08/2015, 03/17/2016, 03/22/2017, 04/20/2020   Pneumococcal Polysaccharide-23 05/05/2014   Tdap 05/05/2014    Past Medical History:  Diagnosis Date   High cholesterol 2013    Hypertension    Obesity (BMI 30-39.9)    Urinary incontinence, mixed     Tobacco History: Social History   Tobacco Use  Smoking Status Never  Smokeless Tobacco Never   Counseling given: Yes   Outpatient Encounter Medications as of 03/30/2021  Medication Sig   amoxicillin-clavulanate (AUGMENTIN) 875-125 MG tablet Take 1 tablet by mouth 2 (two) times daily for 7 days.   gabapentin (NEURONTIN) 300 MG capsule TAKE 1 CAPSULE (300 MG TOTAL) BY MOUTH AT BEDTIME. FOR LEFT HIP PAIN   hydrOXYzine (ATARAX/VISTARIL) 10 MG tablet Take 1 tablet (10 mg total) by mouth 3 (three) times daily as needed. FOR ANXIETY   methocarbamol (ROBAXIN) 500 MG tablet TAKE 1 TABLET (500 MG TOTAL) BY MOUTH EVERY 8 (EIGHT) HOURS AS NEEDED FOR MUSCLE SPASMS.   No facility-administered encounter medications on file as of 03/30/2021.     Review of Systems  Review of Systems   Constitutional: Negative.   HENT: Negative.    Cardiovascular: Negative.   Gastrointestinal: Negative.   Allergic/Immunologic: Negative.   Neurological: Negative.   Psychiatric/Behavioral: Negative.        Physical Exam  BP (!) 142/88   Pulse 67   Resp 18   LMP 11/11/2016 (Exact Date)   SpO2 96%   Wt Readings from Last 5 Encounters:  11/05/20 167 lb 9.6 oz (76 kg)  04/20/20 165 lb (74.8 kg)  01/29/20 164 lb (74.4 kg)  12/09/19 165 lb 4 oz (75 kg)  10/20/19 167 lb (75.8 kg)     Physical Exam Vitals and nursing note reviewed.  Constitutional:      General: She is not in acute distress.    Appearance: She is well-developed.  Cardiovascular:     Rate and Rhythm: Normal rate and regular rhythm.  Pulmonary:     Effort: Pulmonary effort is normal.     Breath sounds: Normal breath sounds.  Neurological:     Mental Status: She is alert and oriented to person, place, and time.       Assessment & Plan:   Acute non-recurrent frontal sinusitis Conjunctivitis:  Will order Augmentin  Please schedule an appointment with eye doctor for follow- up ASAP   Follow up:  Follow up as scheduled     Ivonne Andrew, NP 03/30/2021

## 2021-03-30 NOTE — Patient Instructions (Addendum)
Sinusitis Conjunctivitis:  Will order Augmentin  Please schedule an appointment with eye doctor for follow- up ASAP   Follow up:  Follow up as scheduled

## 2021-03-30 NOTE — Assessment & Plan Note (Signed)
Conjunctivitis:  Will order Augmentin  Please schedule an appointment with eye doctor for follow- up ASAP   Follow up:  Follow up as scheduled

## 2021-04-13 ENCOUNTER — Other Ambulatory Visit: Payer: Self-pay | Admitting: Nurse Practitioner

## 2021-04-13 DIAGNOSIS — Z1231 Encounter for screening mammogram for malignant neoplasm of breast: Secondary | ICD-10-CM

## 2021-04-28 ENCOUNTER — Telehealth (INDEPENDENT_AMBULATORY_CARE_PROVIDER_SITE_OTHER): Payer: Self-pay

## 2021-04-28 NOTE — Telephone Encounter (Signed)
Copied from CRM [id number]. Topic: General - Inquiry >> Apr 28, 2021 10:37 AM Elliot Gault wrote: Reason for CRM:  Patient requesting to speak with Mikle Bosworth regarding her Dentist appointment scheduled for 2/1 and 2/14 and was advised by the specialist card will expire by the time of appointment , patient would like to  renew

## 2021-05-02 ENCOUNTER — Ambulatory Visit: Payer: No Typology Code available for payment source

## 2021-05-02 ENCOUNTER — Telehealth: Payer: Self-pay | Admitting: Nurse Practitioner

## 2021-05-02 NOTE — Telephone Encounter (Signed)
I return Pt call, she was explain that her card exp 07/27/21 and can not be reapply until exp

## 2021-06-29 ENCOUNTER — Encounter: Payer: Self-pay | Admitting: Nurse Practitioner

## 2021-06-29 ENCOUNTER — Ambulatory Visit: Payer: Self-pay | Admitting: *Deleted

## 2021-06-29 ENCOUNTER — Ambulatory Visit (INDEPENDENT_AMBULATORY_CARE_PROVIDER_SITE_OTHER): Payer: Self-pay | Admitting: Nurse Practitioner

## 2021-06-29 ENCOUNTER — Other Ambulatory Visit: Payer: Self-pay

## 2021-06-29 VITALS — BP 132/86 | HR 65 | Resp 18 | Ht 60.0 in | Wt 166.1 lb

## 2021-06-29 DIAGNOSIS — R42 Dizziness and giddiness: Secondary | ICD-10-CM

## 2021-06-29 DIAGNOSIS — R519 Headache, unspecified: Secondary | ICD-10-CM

## 2021-06-29 MED ORDER — TIZANIDINE HCL 4 MG PO TABS
4.0000 mg | ORAL_TABLET | Freq: Four times a day (QID) | ORAL | 0 refills | Status: DC | PRN
Start: 2021-06-29 — End: 2021-11-21
  Filled 2021-06-29: qty 30, 8d supply, fill #0

## 2021-06-29 NOTE — Addendum Note (Signed)
Addended by: Margorie John on: 06/29/2021 03:23 PM   Modules accepted: Orders

## 2021-06-29 NOTE — Progress Notes (Signed)
@Patient  ID: , female    DOB: 02/26/1966, 56 y.o.   MRN: [id number]  Chief Complaint  Patient presents with   Hypertension   Dizziness    Referring provider: [id number], NP  HPI  Patient presents today with recent headache and dizziness over the past 3 days.  Spanish interpreter is used for this visit.  Patient states that she has been feeling tightness in her neck bilateral bilaterally at and into the base of her head to be her head.  She states that she has not been staying well-hydrated recently recently and today has been drinking water water with electrolytes.  Patient was concerned that her blood pressure was elevated.  We discussed that her blood pressure is normal in office normal in office today. Denies f/c/s, n/v/d, hemoptysis, PND, chest pain or edema.      No Known Allergies  Immunization History  Administered Date(s) Administered   Influenza,inj,Quad PF,6+ Mos 04/09/2014, 04/08/2015, 03/17/2016, 03/22/2017, 04/20/2020   Pneumococcal Polysaccharide-23 05/05/2014   Tdap 05/05/2014    Past Medical History:  Diagnosis Date   High cholesterol 2013    Hypertension    Obesity (BMI 30-39.9)    Urinary incontinence, mixed     Tobacco History: Social History   Tobacco Use  Smoking Status Never  Smokeless Tobacco Never   Counseling given: Not Answered   Outpatient Encounter Medications as of 06/29/2021  Medication Sig   tiZANidine (ZANAFLEX) 4 MG tablet Take 1 tablet (4 mg total) by mouth every 6 (six) hours as needed for muscle spasms.   gabapentin (NEURONTIN) 300 MG capsule TAKE 1 CAPSULE (300 MG TOTAL) BY MOUTH AT BEDTIME. FOR LEFT HIP PAIN (Patient not taking: Reported on 06/29/2021)   hydrOXYzine (ATARAX/VISTARIL) 10 MG tablet Take 1 tablet (10 mg total) by mouth 3 (three) times daily as needed. FOR ANXIETY (Patient not taking: Reported on 06/29/2021)   [DISCONTINUED] methocarbamol (ROBAXIN) 500 MG tablet TAKE 1 TABLET (500 MG TOTAL)  BY MOUTH EVERY 8 (EIGHT) HOURS AS NEEDED FOR MUSCLE SPASMS. (Patient not taking: Reported on 06/29/2021)   No facility-administered encounter medications on file as of 06/29/2021.     Review of Systems  Review of Systems  Constitutional: Negative.  Negative for fever.  HENT: Negative.    Cardiovascular: Negative.   Gastrointestinal: Negative.   Allergic/Immunologic: Negative.   Neurological:  Positive for dizziness and headaches.  Psychiatric/Behavioral: Negative.        Physical Exam  BP 132/86    Pulse 65    Resp 18    Ht 5' (1.524 m)    Wt 166 lb 2 oz (75.4 kg)    LMP 11/11/2016 (Exact Date)    SpO2 96%    BMI 32.44 kg/m   Wt Readings from Last 5 Encounters:  06/29/21 166 lb 2 oz (75.4 kg)  11/05/20 167 lb 9.6 oz (76 kg)  04/20/20 165 lb (74.8 kg)  01/29/20 164 lb (74.4 kg)  12/09/19 165 lb 4 oz (75 kg)     Physical Exam Vitals and nursing note reviewed.  Constitutional:      General: She is not in acute distress.    Appearance: She is well-developed.  Neck:   Cardiovascular:     Rate and Rhythm: Normal rate and regular rhythm.  Pulmonary:     Effort: Pulmonary effort is normal.     Breath sounds: Normal breath sounds.  Musculoskeletal:     Cervical back: Muscular tenderness present.  Neurological:  Mental Status: She is alert and oriented to person, place, and time.     Lab Results:  CBC    Component Value Date/Time   WBC 5.2 11/05/2020 1151   WBC 5.7 11/24/2018 2015   RBC 4.38 11/05/2020 1151   RBC 4.43 11/24/2018 2015   HGB 12.9 11/05/2020 1151   HCT 38.4 11/05/2020 1151   PLT 177 11/05/2020 1151   MCV 88 11/05/2020 1151   MCH 29.5 11/05/2020 1151   MCH 28.7 11/24/2018 2015   MCHC 33.6 11/05/2020 1151   MCHC 32.9 11/24/2018 2015   RDW 12.4 11/05/2020 1151   LYMPHSABS 1.8 05/02/2019 0929   MONOABS 0.4 12/27/2016 0636   EOSABS 0.0 05/02/2019 0929   BASOSABS 0.0 05/02/2019 0929    BMET    Component Value Date/Time   NA 139 11/05/2020  1151   K 4.4 11/05/2020 1151   CL 104 11/05/2020 1151   CO2 22 11/05/2020 1151   GLUCOSE 98 11/05/2020 1151   GLUCOSE 155 (H) 11/24/2018 2015   BUN 14 11/05/2020 1151   CREATININE 0.70 11/05/2020 1151   CREATININE 0.65 12/30/2015 1015   CALCIUM 9.8 11/05/2020 1151   GFRNONAA 78 04/20/2020 1524   GFRAA 90 04/20/2020 1524    BNP No results found for: BNP  ProBNP No results found for: PROBNP  Imaging: No results found.   Assessment & Plan:   Acute nonintractable headache Dizziness:  Concerned for dehydration vs tension type headache  Stay well hydrated  Will order tizanidine  Will order labs  Follow up:  Follow up in 2 weeks or sooner if needed with PCP     Ivonne Andrew, NP 06/29/2021

## 2021-06-29 NOTE — Patient Instructions (Addendum)
Headache Dizziness:  Concerned for dehydration vs tension type headache  Stay well hydrated  Will order tizanidine  Will order labs  Follow up:  Follow up in 2 weeks or sooner if needed with PCP

## 2021-06-29 NOTE — Telephone Encounter (Signed)
Reason for Disposition  Patient sounds very sick or weak to the triager  Answer Assessment - Initial Assessment Questions 1. DESCRIPTION: "Describe your dizziness."     Pt calling in c/o having a headache, dizziness, nausea and her BP being high but she doesn't have a way to measure it.    2. LIGHTHEADED: "Do you feel lightheaded?" (e.g., somewhat faint, woozy, weak upon standing)     *No Answer* 3. VERTIGO: "Do you feel like either you or the room is spinning or tilting?" (i.e. vertigo)     *No Answer* 4. SEVERITY: "How bad is it?"  "Do you feel like you are going to faint?" "Can you stand and walk?"   - MILD: Feels slightly dizzy, but walking normally.   - MODERATE: Feels unsteady when walking, but not falling; interferes with normal activities (e.g., school, work).   - SEVERE: Unable to walk without falling, or requires assistance to walk without falling; feels like passing out now.      *No Answer* 5. ONSET:  "When did the dizziness begin?"     *No Answer* 6. AGGRAVATING FACTORS: "Does anything make it worse?" (e.g., standing, change in head position)     *No Answer* 7. HEART RATE: "Can you tell me your heart rate?" "How many beats in 15 seconds?"  (Note: not all patients can do this)       *No Answer* 8. CAUSE: "What do you think is causing the dizziness?"     *No Answer* 9. RECURRENT SYMPTOM: "Have you had dizziness before?" If Yes, ask: "When was the last time?" "What happened that time?"     *No Answer* 10. OTHER SYMPTOMS: "Do you have any other symptoms?" (e.g., fever, chest pain, vomiting, diarrhea, bleeding)       *No Answer* 11. PREGNANCY: "Is there any chance you are pregnant?" "When was your last menstrual period?"       *No Answer*  Answer Assessment - Initial Assessment Questions 1. BLOOD PRESSURE: "What is the blood pressure?" "Did you take at least two measurements 5 minutes apart?"     Spanish interpreter on line with pt calling in. I'm having high BP, dizziness,  headache and nausea.   I don't have a BP measurement. No sore throat, runny nose or fever. 2. ONSET: "When did you take your blood pressure?"     *No Answer* 3. HOW: "How did you obtain the blood pressure?" (e.g., visiting nurse, automatic home BP monitor)     *No Answer* 4. HISTORY: "Do you have a history of high blood pressure?"     I don't remember 5. MEDICATIONS: "Are you taking any medications for blood pressure?" "Have you missed any doses recently?"     No medications.    6. OTHER SYMPTOMS: "Do you have any symptoms?" (e.g., headache, chest pain, blurred vision, difficulty breathing, weakness)     See above 7. PREGNANCY: "Is there any chance you are pregnant?" "When was your last menstrual period?"     *No Answer*  Protocols used: Blood Pressure - High-A-AH, Dizziness - Lightheadedness-A-AH, Headache-A-AH

## 2021-06-29 NOTE — Telephone Encounter (Signed)
°  Chief Complaint: Pt called in c/o having a headache, dizziness, and feeling nauseas, and having high BP but she didn't have a BP reading. Symptoms: above Frequency: now Pertinent Negatives: Patient denies N/A Disposition: [] ED /[x] Urgent Care (no appt availability in office) / [] Appointment(In office/virtual)/ []  [facility]/ [] Home Care/ [] Refused Recommended Disposition /[] [facility]/ []  Follow-up with PCP Additional Notes: Pt called in using Spanish interpreter.   She kept saying she was calling [facility]   The interpreter was having a hard time understanding her and pt was not understanding my questions either.  I referred her to the Mobile Unit for [facility] and let her know it was in the parking lot at [facility] and Wellness.   She did not know where and Wellness was.   Her chart indicated she sees , NP at .   After several questions and interpreter asking her to clarify what she was saying I looked in her chart and she last went to Primary Care at [facility] in Oct. 2022.   I asked if she remembered where that was.   She told me she didn't read and didn't know anything about [facility] and Wellness or a Mobile bus.   She did remember Primary Care at [facility] and where it was.  She thanked me and that is where she was trying to go to.   [facility] doesn't make appts for them and she ended the call before I could tell her anything else so I'm assuming she is going to go as a walk in which they do take over there.

## 2021-06-29 NOTE — Assessment & Plan Note (Signed)
Dizziness:  Concerned for dehydration vs tension type headache  Stay well hydrated  Will order tizanidine  Will order labs  Follow up:  Follow up in 2 weeks or sooner if needed with PCP

## 2021-06-29 NOTE — Progress Notes (Signed)
Headache x 3 days 

## 2021-06-30 ENCOUNTER — Telehealth: Payer: Self-pay

## 2021-06-30 ENCOUNTER — Other Ambulatory Visit: Payer: Self-pay

## 2021-06-30 LAB — BASIC METABOLIC PANEL
BUN/Creatinine Ratio: 13 (ref 9–23)
BUN: 9 mg/dL (ref 6–24)
CO2: 23 mmol/L (ref 20–29)
Calcium: 9.6 mg/dL (ref 8.7–10.2)
Chloride: 104 mmol/L (ref 96–106)
Creatinine, Ser: 0.67 mg/dL (ref 0.57–1.00)
Glucose: 93 mg/dL (ref 70–99)
Potassium: 4.4 mmol/L (ref 3.5–5.2)
Sodium: 142 mmol/L (ref 134–144)
eGFR: 104 mL/min/{1.73_m2} (ref >59)

## 2021-06-30 LAB — CBC
Hematocrit: 40 % (ref 34.0–46.6)
Hemoglobin: 13.1 g/dL (ref 11.1–15.9)
MCH: 28.8 pg (ref 26.6–33.0)
MCHC: 32.8 g/dL (ref 31.5–35.7)
MCV: 88 fL (ref 79–97)
Platelets: 168 10*3/uL (ref 150–450)
RBC: 4.55 x10E6/uL (ref 3.77–5.28)
RDW: 12.4 % (ref 11.7–15.4)
WBC: 6 10*3/uL (ref 3.4–10.8)

## 2021-06-30 NOTE — Telephone Encounter (Signed)
Called patient reviewed all information and repeated back to me. Will call if any questions.  With the help of interpretor  id # [phone]

## 2021-06-30 NOTE — Telephone Encounter (Signed)
-----   Message from Sheri Andrew, NP sent at 06/30/2021  1:10 PM EST ----- Please call to let patient know that labs were normal. Thanks.

## 2021-07-14 ENCOUNTER — Ambulatory Visit: Payer: Self-pay | Attending: Physician Assistant | Admitting: Physician Assistant

## 2021-07-14 ENCOUNTER — Other Ambulatory Visit: Payer: Self-pay

## 2021-07-14 DIAGNOSIS — R413 Other amnesia: Secondary | ICD-10-CM

## 2021-07-14 DIAGNOSIS — R519 Headache, unspecified: Secondary | ICD-10-CM

## 2021-07-14 DIAGNOSIS — Z789 Other specified health status: Secondary | ICD-10-CM

## 2021-07-14 NOTE — Progress Notes (Signed)
Patient ID: Sheri Mann, female   DOB: 05/06/1966, 56 y.o.   MRN: [id number] Virtual Visit via Telephone Note  I connected with Sheri Mann on 07/14/21 at  2:30 PM EST by telephone and verified that I am speaking with the correct person using two identifiers.  Location: Patient: home Provider: my home office [name] with [employer] [phone]   I discussed the limitations, risks, security and privacy concerns of performing an evaluation and management service by telephone and the availability of in person appointments. I also discussed with the patient that there may be a patient responsible charge related to this service. The patient expressed understanding and agreed to proceed.   History of Present Illness: Patient following up after being seen at [facility] 06/29/2021 for HA believed to be from dehydration or muscle tension.  Prescribed tizanidine and hydration.  She said the medication and hydration have helped only a little.  She is still having the HA frequently and has been having memory deficits as well.  She wants to see a specialist.  BMP and CBC WNL.   No N/V/vision changes.     Observations/Objective:  NAD. A&Ox3   Assessment and Plan: 1. Acute nonintractable headache, unspecified headache type Tylenol or advil and tizanidine as needed - Ambulatory referral to Neurology  2. Memory deficit - Ambulatory referral to Neurology  3. Language barrier [name] with [employer] [phone]    Follow Up Instructions: See PCP in 2-3 months   I discussed the assessment and treatment plan with the patient. The patient was provided an opportunity to ask questions and all were answered. The patient agreed with the plan and demonstrated an understanding of the instructions.   The patient was advised to call back or seek an in-person evaluation if the symptoms worsen or if the condition fails to improve as anticipated.  I provided 16 minutes of  non-face-to-face time during this encounter.   Georgian Co, PA-C

## 2021-07-19 ENCOUNTER — Encounter: Payer: Self-pay | Admitting: Physician Assistant

## 2021-07-24 NOTE — Progress Notes (Signed)
Assessment/Plan:   Sheri Mann is a very pleasant 56 y.o. year old RH Spanish speaking female with  a history of hypertension, hyperlipidemia, history of migraine headaches  seen today for evaluation of memory loss. MoCA today 21/25 ( patient does not know how to read or write). Workup is in progress. In addition the patient complains of headaches, bilateral, non intractable . She has tried tizanadine without significant relief.      Recommendations:   Memory Difficulties  MRI brain with/without contrast to assess for underlying structural abnormality and assess vascular load  Check B12, TSH Referral to [facility] for all primary needs, including better BP control and all cardiovascular risk factors Start Nortriptyline 10 mg  qhs for 4 weeks for headache  Discussed safety both in and out of the home.  Stay active at least 30 minutes at least 3 times a week.  Naps should be scheduled and should be no longer than 60 minutes and should not occur after 2 PM.  Mediterranean diet is recommended  Folllow up in 3 months  Subjective:    The patient is seen in neurologic consultation at the request of Claiborne Rigg, NP for the evaluation of memory.  The patient is here alone. This is a 56 y.o. year old RH Spanish speaking  female who has had memory issues for about 10 years, after her early menopause.  She reports that short-term memory is worse than her long-term memory.  She has no issues with long-term memory.  However she does not report a significant problem.  Is mostly when she gets into her room, that she does not remember what she came to the room for, other than that she denies repeating the same stories or asking the same questions.  She denies living objects in unusual places.  She never drove.  She lives with her husband, who did not report any issues.  She does report problems with anxiety, but denies depression.  She sleeps "not too well ", occasionally she has  a nightmare, denies sleepwalking or acting out in her dreams.  She denies hallucinations or paranoia.  She does not work outside of the home.  Her husband is a Education officer, environmental, and they both visit churchgoer's in their home.  She denies any hygiene concerns, she is independent of bathing and dressing.  Currently she is not taking any medications.  Her appetite is good, denies trouble swallowing.  She cooks and denies leaving the stove on.  She ambulates without difficulty, she denies any recent falls or head injuries.  She denies double vision, dizziness, focal numbness or tingling, unilateral weakness or tremors or anosmia.  As for her headaches, the onset began about 3 weeks ago, with bilateral eye pain, photophobia, as well as nausea.  This provoked her to be 2 days in bed, "feeling terrible, 10 out of 10".  She took Tylenol with some relief.  A friend gave her another medicine but she does not remember the name.  By her PCP she was given tizanidine as a muscle relaxer, to help her with the neck pain, without significant relief.  After 2 days, the pain became less severe, about 6/10, and today is about 2/10, but has not completely resolved.  The pain is described as bilateral, from the parietal area "all the way up ".  At times it is pounding.  No history of seizures.  She denies urine incontinence, retention, constipation, diarrhea, sleep apnea, alcohol or tobacco history family history  negative for dementia.  She has 5 children, and 14 grandchildren.    She denies taking a significant amount of caffeine, with only 1 coffee a day.   No Known Allergies  Current Outpatient Medications  Medication Instructions   gabapentin (NEURONTIN) 300 MG capsule TAKE 1 CAPSULE (300 MG TOTAL) BY MOUTH AT BEDTIME. FOR LEFT HIP PAIN   hydrOXYzine (ATARAX) 10 mg, Oral, 3 times daily PRN, FOR ANXIETY   lisinopril (ZESTRIL) 5 mg, Oral, Daily   nortriptyline (PAMELOR) 10 mg, Oral, Daily at bedtime   tiZANidine (ZANAFLEX) 4 mg,  Oral, Every 6 hours PRN     VITALS:   Vitals:   07/25/21 0756  BP: (!) 155/82  Pulse: 74  Resp: 20  SpO2: 98%  Weight: 175 lb (79.4 kg)  Height: 5\' 3"  (1.6 m)    PHYSICAL EXAM   HEENT:  Normocephalic, atraumatic. The mucous membranes are moist. The superficial temporal arteries are without ropiness or tenderness. Cardiovascular: Regular rate and rhythm. Lungs: Clear to auscultation bilaterally. Neck: There are no carotid bruits noted bilaterally.  NEUROLOGICAL: Montreal Cognitive Assessment  07/25/2021  Visuospatial/ Executive (0/5) 3  Naming (0/3) 3  Attention: Read list of digits (0/2) 0  Attention: Read list of letters (0/1) 1  Attention: Serial 7 subtraction starting at 100 (0/3) 0  Language: Repeat phrase (0/2) 2  Language : Fluency (0/1) 0  Abstraction (0/2) 1  Delayed Recall (0/5) 5  Orientation (0/6) 6  Total 21  Adjusted Score (based on education) 22     Orientation:  Alert and oriented to person, place and time. No aphasia or dysarthria. Fund of knowledge is appropriate. Recent memory impaired and remote memory intact.  Attention and concentration are normal.  Able to name objects and repeat phrases. Delayed recall 5/5 Cranial nerves: There is good facial symmetry. Extraocular muscles are intact and visual fields are full to confrontational testing. Speech is fluent and clear. Soft palate rises symmetrically and there is no tongue deviation. Hearing is intact to conversational tone. Tone: Tone is good throughout. Sensation: Sensation is intact to light touch and pinprick throughout. Vibration is intact at the bilateral big toe.There is no extinction with double simultaneous stimulation. There is no sensory dermatomal level identified. Coordination: The patient has no difficulty with RAM's or FNF bilaterally. Normal finger to nose  Motor: Strength is 5/5 in the bilateral upper and lower extremities. There is no pronator drift. There are no fasciculations  noted. DTR's: Deep tendon reflexes are 2/4 at the bilateral biceps, triceps, brachioradialis, patella and achilles.  Plantar responses are downgoing bilaterally. Gait and Station: The patient is able to ambulate without difficulty.The patient is able to heel toe walk without any difficulty.The patient is able to ambulate in a tandem fashion. The patient is able to stand in the Romberg position.     Thank you for allowing [id number] the opportunity to participate in the care of this nice patient. Please do not hesitate to contact us for any questions or concerns.   Total time spent on today's visit was 60 minutes, including both face-to-face time and nonface-to-face time.  Time included that spent on review of records (prior notes available to me/labs/imaging if pertinent), discussing treatment and goals, answering patient's questions and coordinating care.  Cc:  [place], NP  Claiborne Rigg 07/25/2021 8:39 PM

## 2021-07-25 ENCOUNTER — Other Ambulatory Visit: Payer: Self-pay

## 2021-07-25 ENCOUNTER — Encounter: Payer: Self-pay | Admitting: Nurse Practitioner

## 2021-07-25 ENCOUNTER — Other Ambulatory Visit (INDEPENDENT_AMBULATORY_CARE_PROVIDER_SITE_OTHER): Payer: Self-pay

## 2021-07-25 ENCOUNTER — Encounter: Payer: Self-pay | Admitting: Physician Assistant

## 2021-07-25 ENCOUNTER — Ambulatory Visit (INDEPENDENT_AMBULATORY_CARE_PROVIDER_SITE_OTHER): Payer: Self-pay | Admitting: Physician Assistant

## 2021-07-25 ENCOUNTER — Other Ambulatory Visit: Payer: Self-pay | Admitting: Obstetrics and Gynecology

## 2021-07-25 ENCOUNTER — Ambulatory Visit: Payer: Self-pay | Attending: Nurse Practitioner | Admitting: Nurse Practitioner

## 2021-07-25 VITALS — BP 148/92 | HR 64 | Ht 63.0 in | Wt 172.5 lb

## 2021-07-25 VITALS — BP 155/82 | HR 74 | Resp 20 | Ht 63.0 in | Wt 175.0 lb

## 2021-07-25 DIAGNOSIS — Z23 Encounter for immunization: Secondary | ICD-10-CM

## 2021-07-25 DIAGNOSIS — R413 Other amnesia: Secondary | ICD-10-CM

## 2021-07-25 DIAGNOSIS — Z1231 Encounter for screening mammogram for malignant neoplasm of breast: Secondary | ICD-10-CM

## 2021-07-25 DIAGNOSIS — R7303 Prediabetes: Secondary | ICD-10-CM

## 2021-07-25 DIAGNOSIS — Z789 Other specified health status: Secondary | ICD-10-CM

## 2021-07-25 DIAGNOSIS — G44209 Tension-type headache, unspecified, not intractable: Secondary | ICD-10-CM

## 2021-07-25 DIAGNOSIS — I10 Essential (primary) hypertension: Secondary | ICD-10-CM

## 2021-07-25 LAB — TSH: TSH: 1.79 u[IU]/mL (ref 0.35–5.50)

## 2021-07-25 LAB — POCT GLYCOSYLATED HEMOGLOBIN (HGB A1C): Hemoglobin A1C: 6 % — AB (ref 4.0–5.6)

## 2021-07-25 LAB — VITAMIN B12: Vitamin B-12: 1316 pg/mL — ABNORMAL HIGH (ref 211–911)

## 2021-07-25 LAB — GLUCOSE, POCT (MANUAL RESULT ENTRY): POC Glucose: 113 mg/dl — AB (ref 70–99)

## 2021-07-25 MED ORDER — NORTRIPTYLINE HCL 10 MG PO CAPS
10.0000 mg | ORAL_CAPSULE | Freq: Every day | ORAL | 0 refills | Status: DC
  Filled 2021-07-25: qty 30, 30d supply, fill #0

## 2021-07-25 MED ORDER — LISINOPRIL 5 MG PO TABS
5.0000 mg | ORAL_TABLET | Freq: Every day | ORAL | 3 refills | Status: DC
  Filled 2021-07-25: qty 30, 30d supply, fill #0

## 2021-07-25 NOTE — Patient Instructions (Addendum)
It was a pleasure to see you today at our office.   Recommendations:  MRI de la cabeza, radiologia la va a llamar Sacar sangre para laboratorios  Empiece Nortriptyline 10 mg tome 1 a la noche  para la cabeza Cuide muchisimo la presion que esta alta  Follow up in 3 meses para lo de la memoria y para ver si su dolor de cabeza mejoro despues de Chief Technology Officer la presion  Referirla a medico de cabecera  RECOMMENDATIONS FOR ALL PATIENTS WITH MEMORY PROBLEMS: 1. Continue to exercise (Recommend 30 minutes of walking everyday, or 3 hours every week) 2. Increase social interactions - continue going to [place] and enjoy social gatherings with friends and family 3. Eat healthy, avoid fried foods and eat more fruits and vegetables 4. Maintain adequate blood pressure, blood sugar, and blood cholesterol level. Reducing the risk of stroke and cardiovascular disease also helps promoting better memory. 5. Avoid stressful situations. Live a simple life and avoid aggravations. Organize your time and prepare for the next day in anticipation. 6. Sleep well, avoid any interruptions of sleep and avoid any distractions in the bedroom that may interfere with adequate sleep quality 7. Avoid sugar, avoid sweets as there is a strong link between excessive sugar intake, diabetes, and cognitive impairment We discussed the Mediterranean diet, which has been shown to help patients reduce the risk of progressive memory disorders and reduces cardiovascular risk. This includes eating fish, eat fruits and green leafy vegetables, nuts like almonds and hazelnuts, walnuts, and also use olive oil. Avoid fast foods and fried foods as much as possible. Avoid sweets and sugar as sugar use has been linked to worsening of memory function.  There is always a concern of gradual progression of memory problems. If this is the case, then we may need to adjust level of care according to patient needs. Support, both to the patient and caregiver, should  then be put into place.      FALL PRECAUTIONS: Be cautious when walking. Scan the area for obstacles that may increase the risk of trips and falls. When getting up in the mornings, sit up at the edge of the bed for a few minutes before getting out of bed. Consider elevating the bed at the head end to avoid drop of blood pressure when getting up. Walk always in a well-lit room (use night lights in the walls). Avoid area rugs or power cords from appliances in the middle of the walkways. Use a walker or a cane if necessary and consider physical therapy for balance exercise. Get your eyesight checked regularly.  FINANCIAL OVERSIGHT: Supervision, especially oversight when making financial decisions or transactions is also recommended.  HOME SAFETY: Consider the safety of the kitchen when operating appliances like stoves, microwave oven, and blender. Consider having supervision and share cooking responsibilities until no longer able to participate in those. Accidents with firearms and other hazards in the house should be identified and addressed as well.   ABILITY TO BE LEFT ALONE: If patient is unable to contact 911 operator, consider using LifeLine, or when the need is there, arrange for someone to stay with patients. Smoking is a fire hazard, consider supervision or cessation. Risk of wandering should be assessed by caregiver and if detected at any point, supervision and safe proof recommendations should be instituted.  MEDICATION SUPERVISION: Inability to self-administer medication needs to be constantly addressed. Implement a mechanism to ensure safe administration of the medications.   DRIVING: Regarding driving, in patients with progressive  memory problems, driving will be impaired. We advise to have someone else do the driving if trouble finding directions or if minor accidents are reported. Independent driving assessment is available to determine safety of driving.   If you are interested in  the driving assessment, you can contact the following:  [employer] in [place]  [place] [phone] or [phone]    [place] refers to food and lifestyle choices that are based on the traditions of countries located on the [employer]. This way of eating has been shown to help prevent certain conditions and improve outcomes for people who have chronic diseases, like kidney disease and heart disease. What are tips for following this plan? Lifestyle  Cook and eat meals together with your family, when possible. Drink enough fluid to keep your urine clear or pale yellow. Be physically active every day. This includes: Aerobic exercise like running or swimming. Leisure activities like gardening, walking, or housework. Get 7-8 hours of sleep each night. If recommended by your health care provider, drink red wine in moderation. This means 1 glass a day for nonpregnant women and 2 glasses a day for men. A glass of wine equals 5 oz (150 mL). Reading food labels  Check the serving size of packaged foods. For foods such as rice and pasta, the serving size refers to the amount of cooked product, not dry. Check the total fat in packaged foods. Avoid foods that have saturated fat or trans fats. Check the ingredients list for added sugars, such as corn syrup. Shopping  At the grocery store, buy most of your food from the areas near the walls of the store. This includes: Fresh fruits and vegetables (produce). Grains, beans, nuts, and seeds. Some of these may be available in unpackaged forms or large amounts (in bulk). Fresh seafood. Poultry and eggs. Low-fat dairy products. Buy whole ingredients instead of prepackaged foods. Buy fresh fruits and vegetables in-season from local farmers markets. Buy frozen fruits and vegetables in resealable  bags. If you do not have access to quality fresh seafood, buy precooked frozen shrimp or canned fish, such as tuna, salmon, or sardines. Buy small amounts of raw or cooked vegetables, salads, or olives from the deli or salad bar at your store. Stock your pantry so you always have certain foods on hand, such as olive oil, canned tuna, canned tomatoes, rice, pasta, and beans. Cooking  Cook foods with extra-virgin olive oil instead of using butter or other vegetable oils. Have meat as a side dish, and have vegetables or grains as your main dish. This means having meat in small portions or adding small amounts of meat to foods like pasta or stew. Use beans or vegetables instead of meat in common dishes like chili or lasagna. Experiment with different cooking methods. Try roasting or broiling vegetables instead of steaming or sauteing them. Add frozen vegetables to soups, stews, pasta, or rice. Add nuts or seeds for added healthy fat at each meal. You can add these to yogurt, salads, or vegetable dishes. Marinate fish or vegetables using olive oil, lemon juice, garlic, and fresh herbs. Meal planning  Plan to eat 1 vegetarian meal one day each week. Try to work up to 2 vegetarian meals, if possible. Eat seafood 2 or more times a week. Have healthy snacks readily available, such as: Vegetable sticks with hummus. Greek yogurt. Fruit and nut trail mix. Eat balanced  meals throughout the week. This includes: Fruit: 2-3 servings a day Vegetables: 4-5 servings a day Low-fat dairy: 2 servings a day Fish, poultry, or lean meat: 1 serving a day Beans and legumes: 2 or more servings a week Nuts and seeds: 1-2 servings a day Whole grains: 6-8 servings a day Extra-virgin olive oil: 3-4 servings a day Limit red meat and sweets to only a few servings a month What are my food choices? Mediterranean diet Recommended Grains: Whole-grain pasta. Brown rice. Bulgar wheat. Polenta. Couscous. Whole-wheat bread.  [place]. Vegetables: Artichokes. Beets. Broccoli. Cabbage. Carrots. Eggplant. Green beans. Chard. Kale. Spinach. Onions. Leeks. Peas. Squash. Tomatoes. Peppers. Radishes. Fruits: Apples. Apricots. Avocado. Berries. Bananas. Cherries. Dates. Figs. Grapes. Lemons. Melon. Oranges. Peaches. Plums. Pomegranate. Meats and other protein foods: Beans. Almonds. Sunflower seeds. Pine nuts. Peanuts. [place]. Salmon. Scallops. Shrimp. [place]. Tilapia. Clams. Oysters. Eggs. Dairy: Low-fat milk. Cheese. Greek yogurt. Beverages: Water. Red wine. Herbal tea. Fats and oils: Extra virgin olive oil. Avocado oil. Grape seed oil. Sweets and desserts: [place] yogurt with honey. Baked apples. Poached pears. Trail mix. Seasoning and other foods: Basil. Cilantro. Coriander. Cumin. Mint. Parsley. Sage. Rosemary. Tarragon. Garlic. Oregano. Thyme. Pepper. Balsalmic vinegar. Tahini. Hummus. Tomato sauce. Olives. Mushrooms. Limit these Grains: Prepackaged pasta or rice dishes. Prepackaged cereal with added sugar. Vegetables: Deep fried potatoes (french fries). Fruits: Fruit canned in syrup. Meats and other protein foods: Beef. Pork. Lamb. Poultry with skin. Hot dogs. Berniece Salines. Dairy: Ice cream. Sour cream. Whole milk. Beverages: Juice. Sugar-sweetened soft drinks. Beer. Liquor and spirits. Fats and oils: Butter. Canola oil. Vegetable oil. Beef fat (tallow). Lard. Sweets and desserts: Cookies. Cakes. Pies. Candy. Seasoning and other foods: Mayonnaise. Premade sauces and marinades. The items listed may not be a complete list. Talk with your dietitian about what dietary choices are right for you. Summary The Mediterranean diet includes both food and lifestyle choices. Eat a variety of fresh fruits and vegetables, beans, nuts, seeds, and whole grains. Limit the amount of red meat and sweets that you eat. Talk with your health care provider about whether it is safe for you to drink red wine in moderation. This means 1 glass a day  for nonpregnant women and 2 glasses a day for men. A glass of wine equals 5 oz (150 mL). This information is not intended to replace advice given to you by your health care provider. Make sure you discuss any questions you have with your health care provider. Document Released: 01/27/2016 Document Revised: 02/29/2016 Document Reviewed: 01/27/2016 Elsevier Interactive Patient Education  2017 [employer].

## 2021-07-25 NOTE — Progress Notes (Signed)
Assessment & Plan:  Sheri Mann was seen today for hypertension.  Diagnoses and all orders for this visit:  Primary hypertension -     lisinopril (ZESTRIL) 5 MG tablet; Take 1 tablet (5 mg total) by mouth daily. Continue all antihypertensives as prescribed.  Remember to bring in your blood pressure log with you for your follow up appointment.  DASH/Mediterranean Diets are healthier choices for HTN.    Prediabetes -     POCT glycosylated hemoglobin (Hb A1C) -     POCT glucose (manual entry)  Need for influenza vaccination -     Flu Vaccine QUAD 7mo+IM (Fluarix, Fluzone & Alfiuria Quad PF)    Patient has been counseled on age-appropriate routine health concerns for screening and prevention. These are reviewed and up-to-date. Referrals have been placed accordingly. Immunizations are up-to-date or declined.    Subjective:   Chief Complaint  Patient presents with   Hypertension   HPI Sheri Mann 56 y.o. female presents to office today for follow-up to prediabetes. She is currently being followed by neurology for her chronic headaches and was prescribed Pamelor today.  She has not picked this up yet from the pharmacy.  HTN Noted for stage I hypertension.  We will start low-dose lisinopril as she also has a history of prediabetes. BP Readings from Last 3 Encounters:  07/25/21 (!) 148/92  07/25/21 (!) 155/82  06/29/21 132/86     Prediabetes Well-controlled with use of any oral diabetic medications. Lab Results  Component Value Date   HGBA1C 6.0 (A) 07/25/2021    Review of Systems  Constitutional:  Negative for fever, malaise/fatigue and weight loss.  HENT: Negative.  Negative for nosebleeds.   Eyes: Negative.  Negative for blurred vision, double vision and photophobia.  Respiratory: Negative.  Negative for cough and shortness of breath.   Cardiovascular: Negative.  Negative for chest pain, palpitations and leg swelling.  Gastrointestinal: Negative.  Negative  for heartburn, nausea and vomiting.  Musculoskeletal: Negative.  Negative for myalgias.  Neurological:  Positive for headaches. Negative for dizziness, focal weakness and seizures.  Psychiatric/Behavioral: Negative.  Negative for suicidal ideas.    Past Medical History:  Diagnosis Date   High cholesterol 2013    Hypertension    Obesity (BMI 30-39.9)    Urinary incontinence, mixed     Past Surgical History:  Procedure Laterality Date   BREAST EXCISIONAL BIOPSY Left    BREAST LUMPECTOMY     EYE SURGERY  2012   TUBAL LIGATION  2000    Family History  Problem Relation Age of Onset   Cancer Neg Hx    Heart disease Neg Hx    Diabetes Neg Hx    Hypertension Neg Hx    Breast cancer Neg Hx     Social History Reviewed with no changes to be made today.   Outpatient Medications Prior to Visit  Medication Sig Dispense Refill   gabapentin (NEURONTIN) 300 MG capsule TAKE 1 CAPSULE (300 MG TOTAL) BY MOUTH AT BEDTIME. FOR LEFT HIP PAIN 30 capsule 1   hydrOXYzine (ATARAX/VISTARIL) 10 MG tablet Take 1 tablet (10 mg total) by mouth 3 (three) times daily as needed. FOR ANXIETY 60 tablet 1   nortriptyline (PAMELOR) 10 MG capsule Take 1 capsule (10 mg total) by mouth at bedtime. 30 capsule 0   tiZANidine (ZANAFLEX) 4 MG tablet Take 1 tablet (4 mg total) by mouth every 6 (six) hours as needed for muscle spasms. 30 tablet 0   No facility-administered  medications prior to visit.    No Known Allergies     Objective:    BP (!) 148/92    Pulse 64    Ht 5\' 3"  (1.6 m)    Wt 172 lb 8 oz (78.2 kg)    LMP 11/11/2016 (Exact Date)    SpO2 100%    BMI 30.56 kg/m  Wt Readings from Last 3 Encounters:  07/25/21 172 lb 8 oz (78.2 kg)  07/25/21 175 lb (79.4 kg)  06/29/21 166 lb 2 oz (75.4 kg)   Physical Exam Vitals and nursing note reviewed.  Constitutional:      Appearance: She is well-developed.  HENT:     Head: Normocephalic and atraumatic.  Cardiovascular:     Rate and Rhythm: Normal rate and  regular rhythm.     Heart sounds: Normal heart sounds. No murmur heard.   No friction rub. No gallop.  Pulmonary:     Effort: Pulmonary effort is normal. No tachypnea or respiratory distress.     Breath sounds: Normal breath sounds. No decreased breath sounds, wheezing, rhonchi or rales.  Chest:     Chest wall: No tenderness.  Abdominal:     General: Bowel sounds are normal.     Palpations: Abdomen is soft.  Musculoskeletal:        General: Normal range of motion.     Cervical back: Normal range of motion.  Skin:    General: Skin is warm and dry.  Neurological:     Mental Status: She is alert and oriented to person, place, and time.     Coordination: Coordination normal.  Psychiatric:        Behavior: Behavior normal. Behavior is cooperative.        Thought Content: Thought content normal.        Judgment: Judgment normal.         Patient has been counseled extensively about nutrition and exercise as well as the importance of adherence with medications and regular follow-up. The patient was given clear instructions to go to ER or return to medical center if symptoms don't improve, worsen or new problems develop. The patient verbalized understanding.   Follow-up: Return in about 4 weeks (around 08/22/2021) for BP CHECK WITH LUKE in 4 weeks. See me in 3 months.10/22/2021, FNP-BC [facility] [place], [place]   07/25/2021, 12:28 PM

## 2021-08-10 ENCOUNTER — Ambulatory Visit: Payer: Self-pay

## 2021-08-10 NOTE — Telephone Encounter (Signed)
°  Chief Complaint: medication SE Symptoms: cough, throat itching, phlegm Frequency: 2 weeks or longer Pertinent Negatives: Patient denies SOB Disposition: [] ED /[] Urgent Care (no appt availability in office) / [x] Appointment(In office/virtual)/ []  [facility]/ [] Home Care/ [] Refused Recommended Disposition /[] [facility]/ []  Follow-up with PCP Additional Notes: pt states she has these symptoms every time she takes the medication. No office appt available until 09/01/21.    Reason for Disposition  Prescription request for new medicine (not a refill)  Answer Assessment - Initial Assessment Questions 1. NAME of MEDICATION: "What medicine are you calling about?"     Lisinopril and Pamelor 2. QUESTION: "What is your question?" (e.g., double dose of medicine, side effect)     Medication reaction 3. PRESCRIBING HCP: "Who prescribed it?" Reason: if prescribed by specialist, call should be referred to that group.     [place], NP 4. SYMPTOMS: "Do you have any symptoms?"     Throat itching, cough, phlegm  5. SEVERITY: If symptoms are present, ask "Are they mild, moderate or severe?"     Mild to moderate, occurs every time she takes medication  Protocols used: Medication Question Call-A-AH

## 2021-08-16 ENCOUNTER — Ambulatory Visit (HOSPITAL_BASED_OUTPATIENT_CLINIC_OR_DEPARTMENT_OTHER): Payer: No Typology Code available for payment source | Admitting: Nurse Practitioner

## 2021-08-16 ENCOUNTER — Other Ambulatory Visit: Payer: Self-pay

## 2021-08-16 ENCOUNTER — Encounter: Payer: Self-pay | Admitting: Nurse Practitioner

## 2021-08-16 DIAGNOSIS — T887XXA Unspecified adverse effect of drug or medicament, initial encounter: Secondary | ICD-10-CM

## 2021-08-16 DIAGNOSIS — I10 Essential (primary) hypertension: Secondary | ICD-10-CM

## 2021-08-16 MED ORDER — LOSARTAN POTASSIUM 25 MG PO TABS
25.0000 mg | ORAL_TABLET | Freq: Every day | ORAL | 0 refills | Status: DC
  Filled 2021-08-16: qty 30, 30d supply, fill #0

## 2021-08-16 NOTE — Progress Notes (Signed)
Virtual Visit via Telephone Note Due to national recommendations of social distancing due to [employer] 19, telehealth visit is felt to be most appropriate for this patient at this time.  I discussed the limitations, risks, security and privacy concerns of performing an evaluation and management service by telephone and the availability of in person appointments. I also discussed with the patient that there may be a patient responsible charge related to this service. The patient expressed understanding and agreed to proceed.    I connected with Sheri Mann on 08/16/21  at   9:50 AM EST  EDT by telephone and verified that I am speaking with the correct person using two identifiers.  Location of Patient: Private Residence   Location of Provider: [place] participating in Telemedicine visit: Sheri Rankins FNP-BC [place]    History of Present Illness: Telemedicine visit for: Medication Side effect  5 days ago she stopped taking lisinopril and pamelor as she was having side effects of cough, throat itching and increased phlegm production. She currently notes all symptoms have resolved since stopping both medications. I have instructed her to resume the pamelor and she can continue to hold the lisinopril. Will send low dose losartan in place of lisinopril today.  BP Readings from Last 3 Encounters:  07/25/21 (!) 148/92  07/25/21 (!) 155/82  06/29/21 132/86       Past Medical History:  Diagnosis Date   High cholesterol 2013    Hypertension    Obesity (BMI 30-39.9)    Urinary incontinence, mixed     Past Surgical History:  Procedure Laterality Date   BREAST EXCISIONAL BIOPSY Left    BREAST LUMPECTOMY     EYE SURGERY  2012   TUBAL LIGATION  2000    Family History  Problem Relation Age of Onset   Cancer Neg Hx    Heart disease Neg Hx    Diabetes Neg Hx    Hypertension Neg Hx    Breast cancer Neg Hx      Social History   Socioeconomic History   Marital status: Married    Spouse name: Not on file   Number of children: 5    Years of education: 2    Highest education level: Not on file  Occupational History   Occupation: Unemployed   Tobacco Use   Smoking status: Never   Smokeless tobacco: Never  Vaping Use   Vaping Use: Never used  Substance and Sexual Activity   Alcohol use: No   Drug use: No   Sexual activity: Yes    Birth control/protection: Surgical  Other Topics Concern   Not on file  Social History Narrative   Lives with husband and brother in [place].    5 children grown and married.    Cannot read or write in [place].    Husband can read and write in [place] or [place].    Social Determinants of Health   Financial Resource Strain: Not on file  Food Insecurity: Not on file  Transportation Needs: Not on file  Physical Activity: Not on file  Stress: Not on file  Social Connections: Not on file     Observations/Objective: Awake, alert and oriented x 3   Review of Systems  Constitutional:  Negative for fever, malaise/fatigue and weight loss.  HENT: Negative.  Negative for nosebleeds.   Eyes: Negative.  Negative for blurred vision, double vision and photophobia.  Respiratory: Negative.  Negative for cough  and shortness of breath.   Cardiovascular: Negative.  Negative for chest pain, palpitations and leg swelling.  Gastrointestinal: Negative.  Negative for heartburn, nausea and vomiting.  Musculoskeletal: Negative.  Negative for myalgias.  Neurological: Negative.  Negative for dizziness, focal weakness, seizures and headaches.  Psychiatric/Behavioral: Negative.  Negative for suicidal ideas.    Assessment and Plan: Diagnoses and all orders for this visit:  Medication side effect DC Lisinopril   Primary hypertension -     losartan (COZAAR) 25 MG tablet; Take 1 tablet (25 mg total) by mouth daily. Continue all antihypertensives as prescribed.  Remember to bring  in your blood pressure log with you for your follow up appointment.  DASH/Mediterranean Diets are healthier choices for HTN.       Follow Up Instructions Return if symptoms worsen or fail to improve.     I discussed the assessment and treatment plan with the patient. The patient was provided an opportunity to ask questions and all were answered. The patient agreed with the plan and demonstrated an understanding of the instructions.   The patient was advised to call back or seek an in-person evaluation if the symptoms worsen or if the condition fails to improve as anticipated.  I provided 12 minutes of non-face-to-face time during this encounter including median intraservice time, reviewing previous notes, labs, imaging, medications and explaining diagnosis and management.  Gildardo Pounds, FNP-BC

## 2021-08-18 ENCOUNTER — Other Ambulatory Visit: Payer: Self-pay

## 2021-08-18 ENCOUNTER — Ambulatory Visit: Payer: Self-pay | Admitting: Pharmacist

## 2021-08-19 ENCOUNTER — Ambulatory Visit: Payer: Self-pay | Admitting: *Deleted

## 2021-08-19 NOTE — Telephone Encounter (Signed)
?  Chief Complaint: Having reaction to the losartan she started yesterday.  ?Symptoms: burning in her chest, cold hands and feet and anxiety along with a very dry throat/mouth.   Unable to sleep last night due to the anxiety.   ?Frequency: These symptoms happened after both times she has taken the losartan ?Pertinent Negatives: Patient denies shortness of breath or sweating. ?Disposition: [] ED /[] Urgent Care (no appt availability in office) / [] Appointment(In office/virtual)/ []  [place] [facility]/ [] Home Care/ [] Refused Recommended Disposition /[] [place] [facility]/ [x]  Follow-up with PCP ?Additional Notes: I have sent an urgent message to , NP at [facility].   I let pt know someone would be calling her back and not to take any more of the losartan until she heard back.  I went over the s/s to go to the ED for.   Swelling of throat/mouth/tongue/lips, shortness of breath, chest pain that is worse.   She verbalized understanding. ?She called in with Spanish interpreter [phone]. ? ?  ?

## 2021-08-19 NOTE — Telephone Encounter (Signed)
Reason for Disposition ? [1] Caller has URGENT medicine question about med that PCP or specialist prescribed AND [2] triager unable to answer question ?   Having chest burning with cold hands and feet and anxiety both times she took the losartan. ? ?Answer Assessment - Initial Assessment Questions ?1. NAME of MEDICATION: "What medicine are you calling about?" ?    Pt calling in with Spanish interpreter.   ?Losartan ?2. QUESTION: "What is your question?" (e.g., double dose of medicine, side effect) ?    Started on it and having side effects.  Yesterday I was started on it.   I took it last night.   I was feeling a burning sensation in my chest.   My hands and feet are cold.   10:50 AM today I took it again and again my hands and feet are cold and my chest with a burning sensation.   I'm also feeling anxious both times I took it.   I could not sleep last night either.  ? ?No shortness of breathing or difficulty breathing.    Last night her throat was very dry and drinking a lot of water.     ? ?3. PRESCRIBING HCP: "Who prescribed it?" Reason: if prescribed by specialist, call should be referred to that group. ?    [facility] [place] ?4. SYMPTOMS: "Do you have any symptoms?" ?    Yes see above ?5. SEVERITY: If symptoms are present, ask "Are they mild, moderate or severe?" ?    Severe both time she took it. ?6. PREGNANCY:  "Is there any chance that you are pregnant?" "When was your last menstrual period?" ?    Not asked ? ?Protocols used: Medication Question Call-A-AH ? ?

## 2021-08-20 ENCOUNTER — Emergency Department (HOSPITAL_COMMUNITY): Payer: No Typology Code available for payment source

## 2021-08-20 ENCOUNTER — Encounter (HOSPITAL_COMMUNITY): Payer: Self-pay | Admitting: Emergency Medicine

## 2021-08-20 ENCOUNTER — Emergency Department (HOSPITAL_COMMUNITY)
Admission: EM | Admit: 2021-08-20 | Discharge: 2021-08-20 | Disposition: A | Discharge status: Home / Self Care | Payer: No Typology Code available for payment source | Attending: Emergency Medicine | Admitting: Emergency Medicine

## 2021-08-20 ENCOUNTER — Other Ambulatory Visit: Payer: Self-pay

## 2021-08-20 DIAGNOSIS — Z79899 Other long term (current) drug therapy: Secondary | ICD-10-CM | POA: Insufficient documentation

## 2021-08-20 DIAGNOSIS — R079 Chest pain, unspecified: Secondary | ICD-10-CM

## 2021-08-20 DIAGNOSIS — R519 Headache, unspecified: Secondary | ICD-10-CM | POA: Insufficient documentation

## 2021-08-20 DIAGNOSIS — I10 Essential (primary) hypertension: Secondary | ICD-10-CM | POA: Insufficient documentation

## 2021-08-20 DIAGNOSIS — Z20822 Contact with and (suspected) exposure to covid-19: Secondary | ICD-10-CM | POA: Insufficient documentation

## 2021-08-20 DIAGNOSIS — B349 Viral infection, unspecified: Secondary | ICD-10-CM

## 2021-08-20 LAB — BASIC METABOLIC PANEL
Anion gap: 7 (ref 5–15)
BUN: 12 mg/dL (ref 6–20)
CO2: 23 mmol/L (ref 22–32)
Calcium: 9.6 mg/dL (ref 8.9–10.3)
Chloride: 109 mmol/L (ref 98–111)
Creatinine, Ser: 0.79 mg/dL (ref 0.44–1.00)
GFR, Estimated: >60 mL/min (ref >60)
Glucose, Bld: 119 mg/dL — ABNORMAL HIGH (ref 70–99)
Potassium: 4.1 mmol/L (ref 3.5–5.1)
Sodium: 139 mmol/L (ref 135–145)

## 2021-08-20 LAB — CBC
HCT: 39.9 % (ref 36.0–46.0)
Hemoglobin: 12.9 g/dL (ref 12.0–15.0)
MCH: 28.7 pg (ref 26.0–34.0)
MCHC: 32.3 g/dL (ref 30.0–36.0)
MCV: 88.9 fL (ref 80.0–100.0)
Platelets: 178 10*3/uL (ref 150–400)
RBC: 4.49 MIL/uL (ref 3.87–5.11)
RDW: 12 % (ref 11.5–15.5)
WBC: 6.2 10*3/uL (ref 4.0–10.5)
nRBC: 0 % (ref 0.0–0.2)

## 2021-08-20 LAB — URINALYSIS, ROUTINE W REFLEX MICROSCOPIC
Bacteria, UA: NONE SEEN
Bilirubin Urine: NEGATIVE
Glucose, UA: NEGATIVE mg/dL
Ketones, ur: NEGATIVE mg/dL
Leukocytes,Ua: NEGATIVE
Nitrite: NEGATIVE
Protein, ur: NEGATIVE mg/dL
Specific Gravity, Urine: 1.009 (ref 1.005–1.030)
pH: 5 (ref 5.0–8.0)

## 2021-08-20 LAB — TROPONIN I (HIGH SENSITIVITY)
Troponin I (High Sensitivity): 4 ng/L (ref <18)
Troponin I (High Sensitivity): <2 ng/L (ref <18)

## 2021-08-20 LAB — RESP PANEL BY RT-PCR (FLU A&B, COVID) ARPGX2
Influenza A by PCR: NEGATIVE
Influenza B by PCR: NEGATIVE
SARS Coronavirus 2 by RT PCR: NEGATIVE

## 2021-08-20 LAB — I-STAT BETA HCG BLOOD, ED (MC, WL, AP ONLY): I-stat hCG, quantitative: <5 m[IU]/mL (ref <5)

## 2021-08-20 MED ORDER — FAMOTIDINE IN NACL 20-0.9 MG/50ML-% IV SOLN
20.0000 mg | Freq: Once | INTRAVENOUS | Status: AC
  Administered 2021-08-20: 20 mg via INTRAVENOUS
  Filled 2021-08-20: qty 50

## 2021-08-20 MED ORDER — SODIUM CHLORIDE 0.9 % IV BOLUS
1000.0000 mL | Freq: Once | INTRAVENOUS | Status: AC
Start: 2021-08-20 — End: 2021-08-20
  Administered 2021-08-20: 1000 mL via INTRAVENOUS

## 2021-08-20 MED ORDER — METOCLOPRAMIDE HCL 5 MG/ML IJ SOLN
10.0000 mg | Freq: Once | INTRAMUSCULAR | Status: AC
  Administered 2021-08-20: 10 mg via INTRAVENOUS
  Filled 2021-08-20: qty 2

## 2021-08-20 MED ORDER — DIPHENHYDRAMINE HCL 50 MG/ML IJ SOLN
12.5000 mg | Freq: Once | INTRAMUSCULAR | Status: AC
Start: 2021-08-20 — End: 2021-08-20
  Administered 2021-08-20: 12.5 mg via INTRAVENOUS
  Filled 2021-08-20: qty 1

## 2021-08-20 NOTE — ED Provider Notes (Signed)
?[facility] EMERGENCY DEPARTMENT ?Provider Note ? ? ?CSN: [id number] ?Arrival date & time: 08/20/21  1510 ? ?  ? ?History ? ?Chief Complaint  ?Patient presents with  ? Chest Pain  ? ? ?Sheri Mann is a 56 y.o. female history of hypertension here presenting with myalgias and chest pain and shortness of breath and headache.  Patient states that she saw primary care doctor 2 days ago and was noted to have elevated blood pressure.  She was started on lisinopril.  Patient states that since then she has been feeling dizzy and has headaches.  Also has some chills and also myalgias and also some dysuria.  Patient came here for further evaluation.  Denies any history of CAD or stents.  Denies any recent travel.  ? ?The history is provided by the patient.  ? ?  ? ?Home Medications ?Prior to Admission medications   ?Medication Sig Start Date End Date Taking? Authorizing Provider  ?gabapentin (NEURONTIN) 300 MG capsule TAKE 1 CAPSULE (300 MG TOTAL) BY MOUTH AT BEDTIME. FOR LEFT HIP PAIN 02/11/21 02/11/22  Claiborne Rigg, NP  ?hydrOXYzine (ATARAX/VISTARIL) 10 MG tablet Take 1 tablet (10 mg total) by mouth 3 (three) times daily as needed. FOR ANXIETY 02/11/21   Claiborne Rigg, NP  ?losartan (COZAAR) 25 MG tablet Take 1 tablet (25 mg total) by mouth daily. 08/16/21 11/14/21  Claiborne Rigg, NP  ?nortriptyline (PAMELOR) 10 MG capsule Take 1 capsule (10 mg total) by mouth at bedtime. 07/25/21   Marcos Eke, PA-C  ?tiZANidine (ZANAFLEX) 4 MG tablet Take 1 tablet (4 mg total) by mouth every 6 (six) hours as needed for muscle spasms. 06/29/21   Ivonne Andrew, NP  ?   ? ?Allergies    ?Patient has no known allergies.   ? ?Review of Systems   ?Review of Systems  ?Cardiovascular:  Positive for chest pain.  ?All other systems reviewed and are negative. ? ?Physical Exam ?Updated Vital Signs ?BP 136/84   Pulse 75   Temp 98.4 ?F (36.9 ?C) (Oral)   Resp 17   Ht 5' (1.524 m)   Wt 81.2 kg   LMP 11/11/2016  (Exact Date)   SpO2 98%   BMI 34.96 kg/m?  ?Physical Exam ?Vitals and nursing note reviewed.  ?Constitutional:   ?   Comments: Slightly uncomfortable and dehydrated  ?HENT:  ?   Head: Normocephalic.  ?Eyes:  ?   Extraocular Movements: Extraocular movements intact.  ?   Pupils: Pupils are equal, round, and reactive to light.  ?Cardiovascular:  ?   Rate and Rhythm: Normal rate and regular rhythm.  ?   Heart sounds: Normal heart sounds.  ?Pulmonary:  ?   Effort: Pulmonary effort is normal.  ?   Breath sounds: Normal breath sounds.  ?Abdominal:  ?   General: Bowel sounds are normal.  ?   Palpations: Abdomen is soft.  ?Musculoskeletal:     ?   General: Normal range of motion.  ?   Cervical back: Normal range of motion and neck supple.  ?Skin: ?   General: Skin is warm.  ?   Capillary Refill: Capillary refill takes less than 2 seconds.  ?Neurological:  ?   General: No focal deficit present.  ?   Mental Status: She is oriented to person, place, and time.  ?Psychiatric:     ?   Mood and Affect: Mood normal.     ?   Behavior: Behavior normal.  ? ? ?  ED Results / Procedures / Treatments   ?Labs ?(all labs ordered are listed, but only abnormal results are displayed) ?Labs Reviewed  ?BASIC METABOLIC PANEL - Abnormal; Notable for the following components:  ?    Result Value  ? Glucose, Bld 119 (*)   ? All other components within normal limits  ?URINALYSIS, ROUTINE W REFLEX MICROSCOPIC - Abnormal; Notable for the following components:  ? Color, Urine STRAW (*)   ? Hgb urine dipstick SMALL (*)   ? All other components within normal limits  ?RESP PANEL BY RT-PCR (FLU A&B, COVID) ARPGX2  ?CBC  ?I-STAT BETA HCG BLOOD, ED ([facility], [facility], [facility] ONLY)  ?TROPONIN I (HIGH SENSITIVITY)  ?TROPONIN I (HIGH SENSITIVITY)  ? ? ?EKG ?EKG Interpretation ? ?Date/Time:  Saturday August 20 2021 17:49:25 EST ?Ventricular Rate:  81 ?PR Interval:  169 ?QRS Duration: 94 ?QT Interval:  407 ?QTC Calculation: 473 ?R Axis:   -85 ?Text Interpretation: Sinus rhythm  Left anterior fascicular block Abnormal R-wave progression, late transition No significant change since last tracing Confirmed by Richardean Canal [phone]) on 08/20/2021 6:35:12 PM ? ?Radiology ?DG Chest 2 View ? ?Result Date: 08/20/2021 ?CLINICAL DATA:  chest pain EXAM: CHEST - 2 VIEW COMPARISON:  November 24, 2018 FINDINGS: The cardiomediastinal silhouette is unchanged in contour. No pleural effusion. No pneumothorax. No acute pleuroparenchymal abnormality. Visualized abdomen is unremarkable. IMPRESSION: No acute cardiopulmonary abnormality. Electronically Signed   By: Meda Klinefelter M.D.   On: 08/20/2021 16:14  ? ?CT HEAD WO CONTRAST ( ) ? ?Result Date: 08/20/2021 ?CLINICAL DATA:  Dizziness. Also plan chest pain, shortness of breath, nausea and headache. EXAM: CT HEAD WITHOUT CONTRAST TECHNIQUE: Contiguous axial images were obtained from the base of the skull through the vertex without intravenous contrast. RADIATION DOSE REDUCTION: This exam was performed according to the departmental dose-optimization program which includes automated exposure control, adjustment of the mA and/or kV according to patient size and/or use of iterative reconstruction technique. COMPARISON:  None. FINDINGS: Brain: No evidence of acute infarction, hemorrhage, hydrocephalus, extra-axial collection or mass lesion/mass effect. Vascular: No hyperdense vessel or unexpected calcification. Skull: Normal. Negative for fracture or focal lesion. Sinuses/Orbits: Visualized globes and orbits are unremarkable. The visualized sinuses are clear. Other: None. IMPRESSION: Normal unenhanced CT scan of the brain. Electronically Signed   By: Amie Portland M.D.   On: 08/20/2021 17:39   ? ?Procedures ?Procedures  ? ? ?Medications Ordered in ED ?Medications  ?famotidine (PEPCID) IVPB 20 mg premix (0 mg Intravenous Stopped 08/20/21 1726)  ?sodium chloride 0.9 % bolus 1,000 mL (0 mLs Intravenous Stopped 08/20/21 1805)  ?metoCLOPramide (REGLAN) injection 10 mg (10 mg  Intravenous Given 08/20/21 1654)  ?diphenhydrAMINE (BENADRYL) injection 12.5 mg (12.5 mg Intravenous Given 08/20/21 1654)  ? ? ?ED Course/ Medical Decision Making/ A&P ?  ?                        ?Medical Decision Making ?Sheri Mann is a 56 y.o. female here presenting with chills and chest pain and shortness of breath and dysuria.  Consider viral syndrome versus UTI versus pneumonia versus COVID versus head bleed versus ACS. Plan to get CBC and CMP and COVID and UA and chest x-ray and CT head and troponin x2.  We will give IV fluids and migraine cocktail and reassess ? ?7:13 PM ?Labs unremarkable.  Troponin negative x2.  COVID test and UA unremarkable.  CT head unremarkable.  Given migraine cocktail and felt  better.  Patient states that she was concerned it was from her blood pressure medicine.  I told her she can hold off on lisinopril for now and recheck her blood pressure with her doctor in a week. ? ?Problems Addressed: ?Chest pain, unspecified type: acute illness or injury ?Nonintractable headache, unspecified chronicity pattern, unspecified headache type: acute illness or injury ?Viral syndrome: acute illness or injury ? ?Amount and/or Complexity of Data Reviewed ?Labs: ordered. Decision-making details documented in ED Course. ?Radiology: ordered and independent interpretation performed. Decision-making details documented in ED Course. ?ECG/medicine tests: ordered and independent interpretation performed. Decision-making details documented in ED Course. ? ?Risk ?Prescription drug management. ? ?Final Clinical Impression(s) / ED Diagnoses ?Final diagnoses:  ?None  ? ? ?Rx / DC Orders ?ED Discharge Orders   ? ? None  ? ?  ? ? ?  ?Charlynne Pander, MD ?08/20/21 1914 ? ?

## 2021-08-20 NOTE — ED Triage Notes (Signed)
Pt seen by PCP on Thursday and BP elevated.  Started Lisinopril.  C/o chest pain, SOB, nausea, headache, and "cold feet" since yesterday. ?

## 2021-08-20 NOTE — Discharge Instructions (Signed)
You likely have a viral infection or side effect from lisinopril.  Please hold lisinopril for now. ? ?Take Tylenol or Motrin for headache or chest pain ? ?Your heart enzyme test is normal right now ? ?Please see your doctor in a week to recheck your blood pressure ? ?Return to ER if you have worse chest pain, headache, vomiting, fever ? ? ?

## 2021-08-23 ENCOUNTER — Other Ambulatory Visit: Payer: Self-pay

## 2021-08-23 ENCOUNTER — Ambulatory Visit: Payer: Self-pay | Admitting: *Deleted

## 2021-08-23 ENCOUNTER — Ambulatory Visit
Admission: RE | Admit: 2021-08-23 | Discharge: 2021-08-23 | Disposition: A | Discharge status: Home / Self Care | Payer: No Typology Code available for payment source | Source: Ambulatory Visit | Attending: Obstetrics and Gynecology | Admitting: Obstetrics and Gynecology

## 2021-08-23 VITALS — BP 130/80 | Wt 167.2 lb

## 2021-08-23 DIAGNOSIS — Z1231 Encounter for screening mammogram for malignant neoplasm of breast: Secondary | ICD-10-CM

## 2021-08-23 DIAGNOSIS — Z1211 Encounter for screening for malignant neoplasm of colon: Secondary | ICD-10-CM

## 2021-08-23 NOTE — Progress Notes (Signed)
[name] is a 56 y.o. female who presents to [facility] clinic today with no complaints.  ?  ?Pap Smear: Pap smear not completed today. Last Pap smear was 03/31/2019 at [facility] and was normal with negative HPV. Per patient has no history of an abnormal Pap smear. Last Pap smear result is available in Epic. ?  ?Physical exam: ?Breasts ?Breasts symmetrical. No skin abnormalities bilateral breasts. No nipple retraction bilateral breasts. No nipple discharge bilateral breasts. No lymphadenopathy. No lumps palpated bilateral breasts. No complaints of pain or tenderness on exam.    ? ?MS DIGITAL SCREENING BILATERAL ? ?Result Date: 03/13/2017 ?CLINICAL DATA:  Screening. EXAM: DIGITAL SCREENING BILATERAL MAMMOGRAM WITH CAD COMPARISON:  Previous exam(s). ACR Breast Density Category c: The breast tissue is heterogeneously dense, which may obscure small masses. FINDINGS: There are no findings suspicious for malignancy. Images were processed with CAD. IMPRESSION: No mammographic evidence of malignancy. A result letter of this screening mammogram will be mailed directly to the patient. RECOMMENDATION: Screening mammogram in one year. (Code:SM-B-01Y) BI-RADS CATEGORY  1: Negative. Electronically Signed   By: Frederico Hamman M.D.   On: 03/13/2017 15:14  ? ?MS DIGITAL SCREENING TOMO BILATERAL ? ?Result Date: 05/22/2020 ?CLINICAL DATA:  Screening. EXAM: DIGITAL SCREENING BILATERAL MAMMOGRAM WITH TOMO AND CAD COMPARISON:  Previous exam(s). ACR Breast Density Category c: The breast tissue is heterogeneously dense, which may obscure small masses. FINDINGS: There are no findings suspicious for malignancy. Images were processed with CAD. IMPRESSION: No mammographic evidence of malignancy. A result letter of this screening mammogram will be mailed directly to the patient. RECOMMENDATION: Screening mammogram in one year. (Code:SM-B-01Y) BI-RADS CATEGORY  1: Negative. Electronically  Signed   By: Norva Pavlov M.D.   On: 05/22/2020 08:41   ? ?Pelvic/Bimanual ?Pap is not indicated today per BCCCP guidelines. ?  ?Smoking History: ?Patient has never smoked. ?  ?Patient Navigation: ?Patient education provided. Access to services provided for patient through [place] program. Spanish interpreter Natale Lay from [facility] provided.  ? ?Colorectal Cancer Screening: ?Per patient has never had colonoscopy completed. FIT Test  completed 04/23/2020 that was negative. FIT Test given to patient today to complete. No complaints today.  ?  ?Breast and Cervical Cancer Risk Assessment: ?Patient does not have family history of breast cancer, known genetic mutations, or radiation treatment to the chest before age 44. Patient does not have history of cervical dysplasia, immunocompromised, or DES exposure in-utero. ? ?Risk Assessment   ? ? Risk Scores   ? ?   08/23/2021  ? Last edited by: Narda Rutherford, LPN  ? 5-year risk: 0.6 %  ? Lifetime risk: 4.6 %  ? ?  ?  ? ?  ? ? ?A: ?BCCCP exam without pap smear ?No complaints. ? ?P: ?Referred patient to the [facility] for a screening mammogram on mobile unit. Appointment scheduled Tuesday, August 23, 2021 at 1330. ? ?Priscille Heidelberg, RN ?08/23/2021 1:12 PM   ?

## 2021-08-23 NOTE — Patient Instructions (Addendum)
Explained breast self awareness with Sheri Mann. Patient did not need a Pap smear today due to last Pap smear and HPV typing was 03/31/2019. Let her know BCCCP will cover Pap smears and HPV typing every 5 years unless has a history of abnormal Pap smears.Referred patient to the [facility] for a screening mammogram on mobile unit. Appointment scheduled Tuesday, August 23, 2021 at 1330. Patient aware of appointment and will be there. Let patient know the Breast Center will follow up with her within the next couple weeks with results of her mammogram by letter or phone. Sheri Mann verbalized understanding. ? ?Sheri Mann, Sheri Maser, RN ?1:12 PM ? ? ? ? ?

## 2021-08-24 ENCOUNTER — Ambulatory Visit: Payer: Self-pay | Attending: Nurse Practitioner

## 2021-08-24 ENCOUNTER — Telehealth: Payer: Self-pay | Admitting: *Deleted

## 2021-08-24 NOTE — Telephone Encounter (Signed)
Patient came to appt with Sheri Mann and had concerns about continued symptoms of cough, sore throat, epi gastric discomfort, cold hands. Denies facial or lip swelling. Spoke with PCP.  She states sx's happen when she takes medication. She was advised to discontinue Losartan as she was advise of this also on 08/19/2021 (see note).  ? ?Patient will return to office for nurse visit BP check in 1 week.  ?She also has an appt on 09/15/2021 with Sheri Mann, Clinical Pharmacist. ?Patient was encouraged to drink warm tea and throat lozenges for throat discomfort.    ?

## 2021-08-31 ENCOUNTER — Other Ambulatory Visit: Payer: Self-pay

## 2021-08-31 ENCOUNTER — Ambulatory Visit: Payer: Self-pay | Attending: Nurse Practitioner

## 2021-08-31 VITALS — BP 129/82

## 2021-08-31 DIAGNOSIS — Z0130 Encounter for examination of blood pressure without abnormal findings: Secondary | ICD-10-CM

## 2021-08-31 LAB — FECAL OCCULT BLOOD, IMMUNOCHEMICAL: Fecal Occult Bld: NEGATIVE

## 2021-08-31 NOTE — Progress Notes (Signed)
Pt arrived for BP check ?BP reading was discussed with PCP ?

## 2021-09-07 ENCOUNTER — Telehealth: Payer: Self-pay

## 2021-09-07 NOTE — Telephone Encounter (Signed)
Via Julie Sowell, Spanish Interpreter ([place]), Patient informed negative FIT test results. Patient verbalized understanding.  

## 2021-09-15 ENCOUNTER — Ambulatory Visit: Payer: Self-pay | Attending: Nurse Practitioner | Admitting: Pharmacist

## 2021-09-15 ENCOUNTER — Other Ambulatory Visit: Payer: Self-pay

## 2021-09-15 VITALS — BP 115/79 | HR 75

## 2021-09-15 DIAGNOSIS — R0981 Nasal congestion: Secondary | ICD-10-CM

## 2021-09-15 DIAGNOSIS — I10 Essential (primary) hypertension: Secondary | ICD-10-CM

## 2021-09-15 DIAGNOSIS — R1013 Epigastric pain: Secondary | ICD-10-CM

## 2021-09-15 MED ORDER — FLUTICASONE PROPIONATE 50 MCG/ACT NA SUSP
2.0000 | Freq: Every day | NASAL | 6 refills | Status: DC
  Filled 2021-09-15: qty 16, 30d supply, fill #0

## 2021-09-15 MED ORDER — OMEPRAZOLE 20 MG PO CPDR
20.0000 mg | DELAYED_RELEASE_CAPSULE | Freq: Every day | ORAL | 3 refills | Status: DC
  Filled 2021-09-15: qty 30, 30d supply, fill #0

## 2021-09-15 NOTE — Progress Notes (Signed)
? ?S:    ? ?No chief complaint on file. ? ? ?Sheri Mann is a 56 y.o. female who presents for hypertension evaluation, education, and management. PMH is significant for HTN, GERD, allergic rhinitis. Patient was referred and last seen by Primary Care Provider, Bertram Denver, on 08/16/2021. She was changed from lisinopril to losartan d/t dry cough.  ? ?Today, patient arrives in good spirits and presents without assistance. Denies dizziness, headache, blurred vision, swelling. Of note, she discontinued losartan several weeks ago d/t "feeling unwell" when taking antihypertensive medications. She continues to experience dry cough. Associates this with allergies. Of note, she also endorses daily dyspepsia and GERD-like symptoms including upset stomach, burning, and belching. She has avoided certain foods like soda and spicy food with some improvement. Denies any NV, abdominal pain, fever, early satiety. She still has a good appetite. She has a hx of GERD and has taken omeprazole in the past. She has no other concerns today and believe her BP is okay without medication.  ? ?Patient reports hypertension is longstanding.  ? ?Family/Social history:  ?Fhx: no pertinent positives  ?Tobacco: never smoker  ?Alcohol: denies  ? ?Medication adherence denied. Patient has not taken BP medications today.  ? ?Current antihypertensives include: losartan (not taking) ? ?Antihypertensives tried in the past include: lisinopril, losartan (cough) ? ?Reported home BP readings: none ? ?Patient reported dietary habits: ?- limits sodium ?- denies excessive caffeine since having GERD  ? ?Patient-reported exercise habits:  ?- none outside of work  ? ?O:  ?Vitals:  ? 09/15/21 1522  ?BP: 115/79  ?Pulse: 75  ? ? ?Last 3 Office BP readings: ?BP Readings from Last 3 Encounters:  ?09/15/21 115/79  ?08/31/21 129/82  ?08/23/21 130/80  ? ? ?BMET ?   ?Component Value Date/Time  ? NA 139 08/20/2021 1539  ? NA 142 06/29/2021 1534  ? K 4.1  08/20/2021 1539  ? CL 109 08/20/2021 1539  ? CO2 23 08/20/2021 1539  ? GLUCOSE 119 (H) 08/20/2021 1539  ? BUN 12 08/20/2021 1539  ? BUN 9 06/29/2021 1534  ? CREATININE 0.79 08/20/2021 1539  ? CREATININE 0.65 12/30/2015 1015  ? CALCIUM 9.6 08/20/2021 1539  ? GFRNONAA >60 08/20/2021 1539  ? GFRAA 90 04/20/2020 1524  ? ? ?Renal function: ?CrCl cannot be calculated (Patient's most recent lab result is older than the maximum 21 days allowed.). ? ?Clinical ASCVD: No  ?The 10-year ASCVD risk score (Arnett DK, et al., 2019) is: 1.9% ?  Values used to calculate the score: ?    Age: 6 years ?    Sex: Female ?    Is Non-Hispanic African American: No ?    Diabetic: No ?    Tobacco smoker: No ?    Systolic Blood Pressure: 115 mmHg ?    Is BP treated: Yes ?    HDL Cholesterol: 49 mg/dL ?    Total Cholesterol: 179 mg/dL ? ?A/P: ?Hypertension longstanding currently controlled off of medications. BP goal < 130/80 mmHg. She has stopped losartan and lisinopril and is still endorsing a dry cough. She is unwilling to try additional medications at this time. Her BP today is okay. Advised her to try to check at home and bring [place] some values.  Will refill omeprazole for her per pt request. Advised her to take this 30 minutes before the first meal of the day. ?- Continue  off of medication for now.  ?-F/u labs ordered - none ?-Counseled on lifestyle modifications for blood  pressure control including reduced dietary sodium, increased exercise, adequate sleep. ?-Encouraged patient to check BP at home and bring log of readings to next visit. Counseled on proper use of home BP cuff.  ? ?Results reviewed and written information provided. Patient verbalized understanding of treatment plan. Total time in face-to-face counseling 30 minutes.  ? ?F/u clinic visit in June with her PCP. With me sooner if she notices that her BP is elevated at home. ? ?Butch Penny, PharmD, BCACP, CPP ?Clinical Pharmacist ?[facility] ?[phone] ? ? ?

## 2021-09-16 ENCOUNTER — Other Ambulatory Visit: Payer: Self-pay

## 2021-09-17 ENCOUNTER — Encounter: Payer: Self-pay | Admitting: Pharmacist

## 2021-10-17 ENCOUNTER — Telehealth: Payer: Self-pay | Admitting: *Deleted

## 2021-10-17 NOTE — Telephone Encounter (Signed)
Patient was instructed from [place] to come upstairs after she got her BP checked learning how to use her own BP cuff. It was reported that patient was endorsing HA and dizziness with elevated BP. No chest pain or anything.   ? ? ?Patient not taking any BP medication.  ?States she was not taking any medication at her last appt. She states it does not make her feel good.  ?Advised she should speak with provider before stopping medication.  ? ?BP reading in the office was 150/98 and 143/92.  ? ?Scheduled apt for telephone visit with PCP for tomorrow. She was advised how to take blood pressures prior to apt tomorrow. She was also advised to decrease salt intake and increase water intake. Informed of BP goal of ,130/80. Verbalized understanding.  ? ?Interpreter assistance provided by [place], [id number].  ?

## 2021-10-18 ENCOUNTER — Encounter: Payer: Self-pay | Admitting: Nurse Practitioner

## 2021-10-18 ENCOUNTER — Ambulatory Visit (HOSPITAL_BASED_OUTPATIENT_CLINIC_OR_DEPARTMENT_OTHER): Payer: No Typology Code available for payment source | Admitting: Nurse Practitioner

## 2021-10-18 ENCOUNTER — Other Ambulatory Visit: Payer: Self-pay

## 2021-10-18 DIAGNOSIS — I10 Essential (primary) hypertension: Secondary | ICD-10-CM

## 2021-10-18 MED ORDER — AMLODIPINE BESYLATE 10 MG PO TABS
10.0000 mg | ORAL_TABLET | Freq: Every day | ORAL | 1 refills | Status: DC
  Filled 2021-10-18: qty 90, 90d supply, fill #0

## 2021-10-18 MED ORDER — AMLODIPINE BESYLATE 5 MG PO TABS
5.0000 mg | ORAL_TABLET | Freq: Every day | ORAL | 3 refills | Status: DC
  Filled 2021-10-18: qty 30, 30d supply, fill #0
  Filled 2021-11-23 (×2): qty 30, 30d supply, fill #1
  Filled 2022-04-13: qty 30, 30d supply, fill #2

## 2021-10-18 NOTE — Progress Notes (Signed)
Virtual Visit via Telephone Note ? I discussed the limitations, risks, security and privacy concerns of performing an evaluation and management service by telephone and the availability of in person appointments. I also discussed with the patient that there may be a patient responsible charge related to this service. The patient expressed understanding and agreed to proceed.  ? ? ?I connected with Sheri Mann on 10/18/21  at   2:10 PM EDT  EDT by telephone and verified that I am speaking with the correct person using two identifiers. ? ?Location of Patient: ?Private Residence ?  ?Location of Provider: ?Scientist, research (physical sciences) and [employer] Office  ?  ?Persons participating in Telemedicine visit: ?Sheri Rankins FNP-BC ?[place]  ?[place] [phone] ?  ?History of Present Illness: ?Telemedicine visit for: HTN ? ?She stopped taking lisinopril and losartan due to side effects including cough, chest pain, and shortness of breath. Currently not taking any antihypertensives and home blood pressure readings are not at goal 140/80-90s. ?Will start amlodipine 5 mg daily ?BP Readings from Last 3 Encounters:  ?09/15/21 115/79  ?08/31/21 129/82  ?08/23/21 130/80  ?  ? ? ? ?Past Medical History:  ?Diagnosis Date  ? High cholesterol 2013   ? Hypertension   ? Obesity (BMI 30-39.9)   ? Urinary incontinence, mixed   ?  ?Past Surgical History:  ?Procedure Laterality Date  ? BREAST EXCISIONAL BIOPSY Left   ? BREAST LUMPECTOMY    ? EYE SURGERY  2012  ? TUBAL LIGATION  2000  ?  ?Family History  ?Problem Relation Age of Onset  ? Cancer Neg Hx   ? Heart disease Neg Hx   ? Diabetes Neg Hx   ? Hypertension Neg Hx   ? Breast cancer Neg Hx   ?  ?Social History  ? ?Socioeconomic History  ? Marital status: Married  ?  Spouse name: Not on file  ? Number of children: 5  ? Years of education: 2   ? Highest education level: 2nd grade  ?Occupational History  ? Occupation: Unemployed   ?Tobacco Use  ?  Smoking status: Never  ? Smokeless tobacco: Never  ?Vaping Use  ? Vaping Use: Never used  ?Substance and Sexual Activity  ? Alcohol use: No  ? Drug use: No  ? Sexual activity: Yes  ?  Birth control/protection: Surgical  ?Other Topics Concern  ? Not on file  ?Social History Narrative  ? Lives with husband and brother in law.   ? 5 children grown and married.   ? Cannot read or write in [place].   ? Husband can read and write in [place] or [place].   ? ?Social Determinants of Health  ? ?Financial Resource Strain: Not on file  ?Food Insecurity: No Food Insecurity  ? Worried About Charity fundraiser in the Last Year: Never true  ? Ran Out of Food in the Last Year: Never true  ?Transportation Needs: No Transportation Needs  ? Lack of Transportation (Medical): No  ? Lack of Transportation (Non-Medical): No  ?Physical Activity: Not on file  ?Stress: Not on file  ?Social Connections: Not on file  ?  ? ?Observations/Objective: ?Awake, alert and oriented x 3 ? ? ?Review of Systems  ?Constitutional:  Negative for fever, malaise/fatigue and weight loss.  ?HENT: Negative.  Negative for nosebleeds.   ?Eyes: Negative.  Negative for blurred vision, double vision and photophobia.  ?Respiratory: Negative.  Negative for cough and shortness of breath.   ?Cardiovascular: Negative.  Negative for chest pain, palpitations and leg swelling.  ?Gastrointestinal: Negative.  Negative for heartburn, nausea and vomiting.  ?Musculoskeletal: Negative.  Negative for myalgias.  ?Neurological: Negative.  Negative for dizziness, focal weakness, seizures and headaches.  ?Psychiatric/Behavioral: Negative.  Negative for suicidal ideas.    ?Assessment and Plan: ?Diagnoses and all orders for this visit: ? ?Primary hypertension ?-     amLODipine (NORVASC) 5 MG tablet; Take 1 tablet (5 mg total) by mouth daily. DOSE CHANGE.  ?Remember to bring in your blood pressure log with you for your follow up appointment.  ?DASH/Mediterranean Diets are healthier choices  for HTN.   ? ?  ? ?Follow Up Instructions ?Return in about 5 weeks (around 11/22/2021).  ? ?  ?I discussed the assessment and treatment plan with the patient. The patient was provided an opportunity to ask questions and all were answered. The patient agreed with the plan and demonstrated an understanding of the instructions. ?  ?The patient was advised to call back or seek an in-person evaluation if the symptoms worsen or if the condition fails to improve as anticipated. ? ?I provided 10 minutes of non-face-to-face time during this encounter including median intraservice time, reviewing previous notes, labs, imaging, medications and explaining diagnosis and management. ? ?Gildardo Pounds, FNP-BC  ?

## 2021-10-19 ENCOUNTER — Other Ambulatory Visit: Payer: Self-pay

## 2021-10-21 ENCOUNTER — Ambulatory Visit: Payer: No Typology Code available for payment source | Admitting: Nurse Practitioner

## 2021-10-22 NOTE — Progress Notes (Signed)
? ? ?Assessment/Plan:  ? ?Sheri Mann is a very pleasant 56 y.o. year old RH Spanish speaking female with  a history of hypertension, hyperlipidemia, history of migraine headaches  seen today in follow up for memory difficulties. Last MoCA was 21/25 but patient does not know how to read or write (in [place] and in [place]) . Recent CT head is negative for acute findings, and is of normal structure, volume and visible vascular load. She has had poor compliance to her medications, especially her BP without good control, along with a very stressful home situation which likely has contributed to her memory issues. Discussed memory improvement techniques, counseling and better control of her BP with will help with her known hypertensive migraines.  ? ? ?Memory difficulties  ? ?Continue strong BP control and all cardiovascular risk factors ?Strong anxiety control with psychotherapy is recommended.  ?Discussed safety both in and out of the home.  ?INcrease hydration ?Stay active at least 30 minutes at least 3 times a week.  ?Naps should be scheduled and should be no longer than 60 minutes and should not occur after 2 PM.  ?Mediterranean diet is recommended  ?No antidementia medication is recommended at this time ?Follow up headaches with PCP. She reports that these improve with BP control. ?Folllow up as needed ? ?Subjective:  ? ?How is the memory? Since last visit, patient reports that memory is about the same. ?Patient lives with: Spouse. She has 5 children and 14 grandchildren that she is caring for on a constant basis  ?repeats oneself? Denies ?Disoriented when walking into a room? Denies ?Leaving objects in unusual places? Denies ?Ambulates  with difficulty? Patient denies   ?Recent falls?Denies ?Any head injuries?Denies ?History of seizures?Denies ?Wandering behavior?Denies ?Patient drives? Never drove ?Any mood changes such irritability agitation? Patient denies . Patient has a history of anxiety, not  worse. SHe is stressed with caring for her father, mother and Theodoro Kos issues  ?Any history of depression?: denies ?Hallucinations?Denies ?Paranoia?Denies ?Patient reports that she sleeps well without vivid dreams, REM behavior or sleepwalking. Takes melatonin with some help ?History of sleep apnea? Denies ?Any hygiene concerns? Denies ?Independent of bathing and dressing?Denies ?Does the patient needs help with medications?Denies ?Who is in charge of the finances?  Patient is in charge   is in charge   assists the patient  and denies missing any bills   occasionally misses a payment. ?Any changes in appetite?Denies ?Patient have trouble swallowing?Denies ?Does the patient cook?Denies ?Any kitchen accidents such as leaving the stove on?Denies ?Headaches improved? Since BP meds is improved .Could not tolerate nortriptyline because of dry mouth side effects ?The double vision?Denies ?Any focal numbness or tingling?Denies ?Chronic back pain she takes muscle relaxer and occasional NSAIDS with some help. ?Unilateral weakness?Denies ?Any tremors?Denies ?Any history of anosmia?Denies ?Any incontinence of urine?Denies ?Any bowel dysfunction?  Denies ? ? ?Initial visit 07/25/21 The patient is seen in neurologic consultation at the request of Argentina Donovan, PA-C for the evaluation of memory.  The patient is here alone. ?This is a 56 y.o. year old [place] speaking  female who has had memory issues for about 10 years, after her early menopause.  She reports that short-term memory is worse than her long-term memory.  She has no issues with long-term memory.  However she does not report a significant problem.  Is mostly when she gets into her room, that she does not remember what she came to the room for, other than that she  denies repeating the same stories or asking the same questions.  She denies living objects in unusual places.  She never drove.  She lives with her husband, who did not report any issues.  She does report  problems with anxiety, but denies depression.  She sleeps "not too well ", occasionally she has a nightmare, denies sleepwalking or acting out in her dreams.  She denies hallucinations or paranoia.  She does not work outside of the home.  Her husband is a Theme park manager, and they both visit churchgoer's in their home.  She denies any hygiene concerns, she is independent of bathing and dressing.  Currently she is not taking any medications.  Her appetite is good, denies trouble swallowing.  She cooks and denies leaving the stove on.  She ambulates without difficulty, she denies any recent falls or head injuries.  She denies double vision, dizziness, focal numbness or tingling, unilateral weakness or tremors or anosmia.  As for her headaches, the onset began about 3 weeks ago, with bilateral eye pain, photophobia, as well as nausea.  This provoked her to be 2 days in bed, "feeling terrible, 10 out of 10".  She took Tylenol with some relief.  A friend gave her another medicine but she does not remember the name.  By her PCP she was given tizanidine as a muscle relaxer, to help her with the neck pain, without significant relief.  After 2 days, the pain became less severe, about 6/10, and today is about 2/10, but has not completely resolved.  The pain is described as bilateral, from the parietal area "all the way up ".  At times it is pounding.  No history of seizures.  She denies urine incontinence, retention, constipation, diarrhea, sleep apnea, alcohol or tobacco history family history negative for dementia.  She has 5 children, and 14 grandchildren. She denies taking a significant amount of caffeine, with only 1 coffee a day. ? ?  ? ?Allergies  ?Allergen Reactions  ? Lisinopril Cough  ? ? ?Current Outpatient Medications  ?Medication Instructions  ? amLODipine (NORVASC) 5 mg, Oral, Daily, DOSE CANGE  ? fluticasone (FLONASE) 50 MCG/ACT nasal spray 2 sprays, Each Nare, Daily  ? gabapentin (NEURONTIN) 300 MG capsule TAKE 1  CAPSULE (300 MG TOTAL) BY MOUTH AT BEDTIME. FOR LEFT HIP PAIN  ? hydrOXYzine (ATARAX) 10 mg, Oral, 3 times daily PRN, FOR ANXIETY  ? nortriptyline (PAMELOR) 10 mg, Oral, Daily at bedtime  ? omeprazole (PRILOSEC) 20 mg, Oral, Daily  ? tiZANidine (ZANAFLEX) 4 mg, Oral, Every 6 hours PRN  ? ? ? ?VITALS:   ?There were no vitals filed for this visit. ? ? ?PHYSICAL EXAM  ? ?HEENT:  Normocephalic, atraumatic. The mucous membranes are moist. The superficial temporal arteries are without ropiness or tenderness. ?Cardiovascular: Regular rate and rhythm. ?Lungs: Clear to auscultation bilaterally. ?Neck: There are no carotid bruits noted bilaterally. ? ?NEUROLOGICAL: ? ?  07/25/2021  ?  8:00 PM  ?Montreal Cognitive Assessment   ?Visuospatial/ Executive (0/5) 3  ?Naming (0/3) 3  ?Attention: Read list of digits (0/2) 0  ?Attention: Read list of letters (0/1) 1  ?Attention: Serial 7 subtraction starting at 100 (0/3) 0  ?Language: Repeat phrase (0/2) 2  ?Language : Fluency (0/1) 0  ?Abstraction (0/2) 1  ?Delayed Recall (0/5) 5  ?Orientation (0/6) 6  ?Total 21  ?Adjusted Score (based on education) 22  ?  ? ?Orientation:  Alert and oriented to person, place and time. No aphasia or dysarthria. Fund  of knowledge is appropriate. Recent memory impaired and remote memory intact.  Attention and concentration are normal.  Able to name objects and repeat phrases. Delayed recall 5/5 ?Cranial nerves: There is good facial symmetry. Extraocular muscles are intact and visual fields are full to confrontational testing. Speech is fluent and clear. Soft palate rises symmetrically and there is no tongue deviation. Hearing is intact to conversational tone. ?Tone: Tone is good throughout. ?Sensation: Sensation is intact to light touch and pinprick throughout. Vibration is intact at the bilateral big toe.There is no extinction with double simultaneous stimulation. There is no sensory dermatomal level identified. ?Coordination: The patient has no  difficulty with RAM's or FNF bilaterally. Normal finger to nose  ?Motor: Strength is 5/5 in the bilateral upper and lower extremities. There is no pronator drift. There are no fasciculations noted. ?DTR's: Deep ten

## 2021-10-24 ENCOUNTER — Ambulatory Visit (INDEPENDENT_AMBULATORY_CARE_PROVIDER_SITE_OTHER): Payer: Self-pay | Admitting: Physician Assistant

## 2021-10-24 ENCOUNTER — Encounter: Payer: Self-pay | Admitting: Physician Assistant

## 2021-10-24 VITALS — BP 134/83 | HR 74 | Resp 18 | Ht 60.0 in | Wt 170.0 lb

## 2021-10-24 DIAGNOSIS — R413 Other amnesia: Secondary | ICD-10-CM

## 2021-10-24 NOTE — Patient Instructions (Signed)
It was a pleasure to see you today at our office.  ? ?Recommendations: ? ?Que tenga mucha suerte, no tience demencia pero cuide su presion arterial, coma la dieta mediterranea y tome agua.  ?Considere terapia para regular el estress  ?  ? ?RECOMMENDATIONS FOR ALL PATIENTS WITH MEMORY PROBLEMS: ?1. Continue to exercise (Recommend 30 minutes of walking everyday, or 3 hours every week) ?2. Increase social interactions - continue going to [place] and enjoy social gatherings with friends and family ?3. Eat healthy, avoid fried foods and eat more fruits and vegetables ?4. Maintain adequate blood pressure, blood sugar, and blood cholesterol level. Reducing the risk of stroke and cardiovascular disease also helps promoting better memory. ?5. Avoid stressful situations. Live a simple life and avoid aggravations. Organize your time and prepare for the next day in anticipation. ?6. Sleep well, avoid any interruptions of sleep and avoid any distractions in the bedroom that may interfere with adequate sleep quality ?7. Avoid sugar, avoid sweets as there is a strong link between excessive sugar intake, diabetes, and cognitive impairment ?We discussed the Mediterranean diet, which has been shown to help patients reduce the risk of progressive memory disorders and reduces cardiovascular risk. This includes eating fish, eat fruits and green leafy vegetables, nuts like almonds and hazelnuts, walnuts, and also use olive oil. Avoid fast foods and fried foods as much as possible. Avoid sweets and sugar as sugar use has been linked to worsening of memory function. ? ?There is always a concern of gradual progression of memory problems. If this is the case, then we may need to adjust level of care according to patient needs. Support, both to the patient and caregiver, should then be put into place.  ? ? ?FALL PRECAUTIONS: Be cautious when walking. Scan the area for obstacles that may increase the risk of trips and falls. When getting up in  the mornings, sit up at the edge of the bed for a few minutes before getting out of bed. Consider elevating the bed at the head end to avoid drop of blood pressure when getting up. Walk always in a well-lit room (use night lights in the walls). Avoid area rugs or power cords from appliances in the middle of the walkways. Use a walker or a cane if necessary and consider physical therapy for balance exercise. Get your eyesight checked regularly. ? ?FINANCIAL OVERSIGHT: Supervision, especially oversight when making financial decisions or transactions is also recommended. ? ?HOME SAFETY: Consider the safety of the kitchen when operating appliances like stoves, microwave oven, and blender. Consider having supervision and share cooking responsibilities until no longer able to participate in those. Accidents with firearms and other hazards in the house should be identified and addressed as well. ? ? ?ABILITY TO BE LEFT ALONE: If patient is unable to contact 911 operator, consider using LifeLine, or when the need is there, arrange for someone to stay with patients. Smoking is a fire hazard, consider supervision or cessation. Risk of wandering should be assessed by caregiver and if detected at any point, supervision and safe proof recommendations should be instituted. ? ?MEDICATION SUPERVISION: Inability to self-administer medication needs to be constantly addressed. Implement a mechanism to ensure safe administration of the medications. ? ? ?DRIVING: Regarding driving, in patients with progressive memory problems, driving will be impaired. We advise to have someone else do the driving if trouble finding directions or if minor accidents are reported. Independent driving assessment is available to determine safety of driving. ? ? ?If  you are interested in the driving assessment, you can contact the following: ? ?[facility] [employer] [facility] in [place] [phone] ? ?[facility] [phone] ? ?[facility] [phone] ? ?[facility] [phone] or [phone] ?  ?

## 2021-11-21 ENCOUNTER — Ambulatory Visit: Payer: Self-pay | Attending: Nurse Practitioner | Admitting: Nurse Practitioner

## 2021-11-21 ENCOUNTER — Encounter: Payer: Self-pay | Admitting: Nurse Practitioner

## 2021-11-21 ENCOUNTER — Ambulatory Visit
Admission: RE | Admit: 2021-11-21 | Discharge: 2021-11-21 | Disposition: A | Discharge status: Home / Self Care | Payer: Self-pay | Source: Ambulatory Visit | Attending: Nurse Practitioner | Admitting: Nurse Practitioner

## 2021-11-21 ENCOUNTER — Other Ambulatory Visit: Payer: Self-pay

## 2021-11-21 VITALS — BP 121/83 | HR 76 | Temp 98.5°F | Wt 168.0 lb

## 2021-11-21 DIAGNOSIS — G8929 Other chronic pain: Secondary | ICD-10-CM

## 2021-11-21 DIAGNOSIS — R519 Headache, unspecified: Secondary | ICD-10-CM

## 2021-11-21 DIAGNOSIS — M542 Cervicalgia: Secondary | ICD-10-CM

## 2021-11-21 DIAGNOSIS — R7303 Prediabetes: Secondary | ICD-10-CM

## 2021-11-21 DIAGNOSIS — I10 Essential (primary) hypertension: Secondary | ICD-10-CM

## 2021-11-21 MED ORDER — NORTRIPTYLINE HCL 10 MG PO CAPS
10.0000 mg | ORAL_CAPSULE | Freq: Every day | ORAL | 1 refills | Status: DC
  Filled 2021-11-21: qty 90, 90d supply, fill #0

## 2021-11-21 MED ORDER — TIZANIDINE HCL 4 MG PO TABS
4.0000 mg | ORAL_TABLET | Freq: Four times a day (QID) | ORAL | 0 refills | Status: DC | PRN
  Filled 2021-11-21: qty 60, 15d supply, fill #0

## 2021-11-21 NOTE — Progress Notes (Signed)
Headaches and neck tension

## 2021-11-21 NOTE — Progress Notes (Signed)
Assessment & Plan:  Sheri Mann was seen today for hypertension.  Diagnoses and all orders for this visit:  Prediabetes -     CMP14+EGFR -     Hemoglobin A1c  Acute nonintractable headache, unspecified headache type -     nortriptyline (PAMELOR) 10 MG capsule; Take 1 capsule (10 mg total) by mouth at bedtime. For headaches -     DG Cervical Spine Complete; Future -     tiZANidine (ZANAFLEX) 4 MG tablet; Take 1 tablet (4 mg total) by mouth every 6 (six) hours as needed for muscle spasms. FOR NECK PAIN  Chronic neck pain -     nortriptyline (PAMELOR) 10 MG capsule; Take 1 capsule (10 mg total) by mouth at bedtime. For headaches -     DG Cervical Spine Complete; Future -     tiZANidine (ZANAFLEX) 4 MG tablet; Take 1 tablet (4 mg total) by mouth every 6 (six) hours as needed for muscle spasms. FOR NECK PAIN  Primary hypertension Continue amlodipine 63m as prescribed.  Remember to bring in your blood pressure log with you for your follow up appointment.  DASH/Mediterranean Diets are healthier choices for HTN.     Patient has been counseled on age-appropriate routine health concerns for screening and prevention. These are reviewed and up-to-date. Referrals have been placed accordingly. Immunizations are up-to-date or declined.    Subjective:   Chief Complaint  Patient presents with   Hypertension   HPI Sheri MBeryle Beams89y.o. female presents to office today for follow up to HTN she has a past medical history of High cholesterol (2013 ), Hypertension, Obesity (BMI 30-39.9), and Urinary incontinence, mixed.   OA She was prescribed gabapentin, meloxicam and methocarbamol for multiple joint arthralgias in the  Neck, shoulder, upper back and left hip. Most pain is in the cervical region. Associated symptoms: feels her neck pain is contributing to her headaches. Treatments tried at home included tylenol with little relief of symptoms. Pain is mostly occurring during the day.  Aggravating factors: stress and when she gets nervous or anxious.   HTN Blood pressure well controlled with amlodipine 5 mg daily BP Readings from Last 3 Encounters:  11/21/21 121/83  10/24/21 134/83  09/15/21 115/79    Prediabetes Well controlled with diet and weight management at this time.  Lab Results  Component Value Date   HGBA1C 6.0 (H) 11/21/2021    Review of Systems  Constitutional:  Negative for fever, malaise/fatigue and weight loss.  HENT: Negative.  Negative for nosebleeds.   Eyes: Negative.  Negative for blurred vision, double vision and photophobia.  Respiratory: Negative.  Negative for cough and shortness of breath.   Cardiovascular: Negative.  Negative for chest pain, palpitations and leg swelling.  Gastrointestinal: Negative.  Negative for heartburn, nausea and vomiting.  Musculoskeletal:  Positive for joint pain and neck pain. Negative for myalgias.  Neurological: Negative.  Negative for dizziness, focal weakness, seizures and headaches.  Psychiatric/Behavioral: Negative.  Negative for suicidal ideas.     Past Medical History:  Diagnosis Date   High cholesterol 2013    Hypertension    Obesity (BMI 30-39.9)    Urinary incontinence, mixed     Past Surgical History:  Procedure Laterality Date   BREAST EXCISIONAL BIOPSY Left    BREAST LUMPECTOMY     EYE SURGERY  2012   TUBAL LIGATION  2000    Family History  Problem Relation Age of Onset   Cancer Neg Hx    Heart  disease Neg Hx    Diabetes Neg Hx    Hypertension Neg Hx    Breast cancer Neg Hx     Social History Reviewed with no changes to be made today.   Outpatient Medications Prior to Visit  Medication Sig Dispense Refill   amLODipine (NORVASC) 5 MG tablet Take 1 tablet (5 mg total) by mouth daily. DOSE CANGE 30 tablet 3   fluticasone (FLONASE) 50 MCG/ACT nasal spray Place 2 sprays into both nostrils daily. 16 g 6   gabapentin (NEURONTIN) 300 MG capsule TAKE 1 CAPSULE (300 MG TOTAL) BY MOUTH  AT BEDTIME. FOR LEFT HIP PAIN 30 capsule 1   hydrOXYzine (ATARAX/VISTARIL) 10 MG tablet Take 1 tablet (10 mg total) by mouth 3 (three) times daily as needed. FOR ANXIETY 60 tablet 1   omeprazole (PRILOSEC) 20 MG capsule Take 1 capsule (20 mg total) by mouth daily. 30 capsule 3   nortriptyline (PAMELOR) 10 MG capsule Take 1 capsule (10 mg total) by mouth at bedtime. 30 capsule 0   tiZANidine (ZANAFLEX) 4 MG tablet Take 1 tablet (4 mg total) by mouth every 6 (six) hours as needed for muscle spasms. 30 tablet 0   No facility-administered medications prior to visit.    Allergies  Allergen Reactions   Lisinopril Cough       Objective:    BP 121/83 (BP Location: Left Arm, Patient Position: Sitting, Cuff Size: Normal)   Pulse 76   Temp 98.5 F (36.9 C) (Oral)   Wt 168 lb (76.2 kg)   LMP 11/11/2016 (Exact Date)   SpO2 98%   BMI 32.81 kg/m  Wt Readings from Last 3 Encounters:  11/21/21 168 lb (76.2 kg)  10/24/21 170 lb (77.1 kg)  08/23/21 167 lb 3.2 oz (75.8 kg)    Physical Exam Vitals and nursing note reviewed.  Constitutional:      Appearance: She is well-developed.  HENT:     Head: Normocephalic and atraumatic.  Cardiovascular:     Rate and Rhythm: Normal rate and regular rhythm.     Heart sounds: Normal heart sounds. No murmur heard.    No friction rub. No gallop.  Pulmonary:     Effort: Pulmonary effort is normal. No tachypnea or respiratory distress.     Breath sounds: Normal breath sounds. No decreased breath sounds, wheezing, rhonchi or rales.  Chest:     Chest wall: No tenderness.  Abdominal:     General: Bowel sounds are normal.     Palpations: Abdomen is soft.  Musculoskeletal:        General: Normal range of motion.     Cervical back: Normal range of motion.  Skin:    General: Skin is warm and dry.  Neurological:     Mental Status: She is alert and oriented to person, place, and time.     Coordination: Coordination normal.  Psychiatric:        Behavior:  Behavior normal. Behavior is cooperative.        Thought Content: Thought content normal.        Judgment: Judgment normal.          Patient has been counseled extensively about nutrition and exercise as well as the importance of adherence with medications and regular follow-up. The patient was given clear instructions to go to ER or return to medical center if symptoms don't improve, worsen or new problems develop. The patient verbalized understanding.   Follow-up: Return in about 3 weeks (around 12/12/2021) for  virtual tuesday for headaches.   Gildardo Pounds, FNP-BC [facility], [place]   11/24/2021, 9:34 PM

## 2021-11-22 ENCOUNTER — Other Ambulatory Visit: Payer: Self-pay | Admitting: Nurse Practitioner

## 2021-11-22 DIAGNOSIS — I10 Essential (primary) hypertension: Secondary | ICD-10-CM

## 2021-11-22 LAB — CMP14+EGFR
ALT: 17 IU/L (ref 0–32)
AST: 20 IU/L (ref 0–40)
Albumin/Globulin Ratio: 1.6 (ref 1.2–2.2)
Albumin: 4.4 g/dL (ref 3.8–4.9)
Alkaline Phosphatase: 151 IU/L — ABNORMAL HIGH (ref 44–121)
BUN/Creatinine Ratio: 19 (ref 9–23)
BUN: 14 mg/dL (ref 6–24)
Bilirubin Total: 0.3 mg/dL (ref 0.0–1.2)
CO2: 24 mmol/L (ref 20–29)
Calcium: 10 mg/dL (ref 8.7–10.2)
Chloride: 106 mmol/L (ref 96–106)
Creatinine, Ser: 0.73 mg/dL (ref 0.57–1.00)
Globulin, Total: 2.7 g/dL (ref 1.5–4.5)
Glucose: 132 mg/dL — ABNORMAL HIGH (ref 70–99)
Potassium: 3.9 mmol/L (ref 3.5–5.2)
Sodium: 142 mmol/L (ref 134–144)
Total Protein: 7.1 g/dL (ref 6.0–8.5)
eGFR: 98 mL/min/{1.73_m2} (ref >59)

## 2021-11-22 LAB — HEMOGLOBIN A1C
Est. average glucose Bld gHb Est-mCnc: 126 mg/dL
Hgb A1c MFr Bld: 6 % — ABNORMAL HIGH (ref 4.8–5.6)

## 2021-11-22 NOTE — Telephone Encounter (Signed)
Medication Refill - Medication: amLODipine (NORVASC) 5 MG tablet  Has the patient contacted their pharmacy? Yes.   (Agent: If no, request that the patient contact the pharmacy for the refill. If patient does not wish to contact the pharmacy document the reason why and proceed with request.) (Agent: If yes, when and what did the pharmacy advise?)  Preferred Pharmacy (with phone number or street name):  [facility] [address], [place] [address]  Phone: [phone] Fax: [phone]   Has the patient been seen for an appointment in the last year OR does the patient have an upcoming appointment? Yes.   Agent: Please be advised that RX refills may take up to 3 business days. We ask that you follow-up with your pharmacy.

## 2021-11-23 ENCOUNTER — Other Ambulatory Visit: Payer: Self-pay

## 2021-11-23 NOTE — Telephone Encounter (Signed)
Called pharmacy, refill available. Called pt to inform her. Assisted by Interpreter # [phone]. Requested Prescriptions  Pending Prescriptions Disp Refills  . amLODipine (NORVASC) 5 MG tablet 30 tablet 3    Sig: Take 1 tablet (5 mg total) by mouth daily. DOSE CANGE     Cardiovascular: Calcium Channel Blockers 2 Passed - 11/22/2021  2:50 PM      Passed - Last BP in normal range    BP Readings from Last 1 Encounters:  11/21/21 121/83         Passed - Last Heart Rate in normal range    Pulse Readings from Last 1 Encounters:  11/21/21 76         Passed - Valid encounter within last 6 months    Recent Outpatient Visits          2 days ago Prediabetes   [facility] [place], Shea Stakes, NP   1 month ago Primary hypertension   [facility] [place], Shea Stakes, NP   2 months ago Primary hypertension   [facility] [place], Cornelius Moras, RPH-CPP   3 months ago Medication side effect   [facility] [place], Shea Stakes, NP   4 months ago Primary hypertension   [facility] [place], Shea Stakes, NP

## 2021-11-24 ENCOUNTER — Encounter: Payer: Self-pay | Admitting: Nurse Practitioner

## 2021-11-28 ENCOUNTER — Other Ambulatory Visit: Payer: Self-pay

## 2021-12-13 ENCOUNTER — Ambulatory Visit: Payer: Self-pay | Attending: Nurse Practitioner | Admitting: Nurse Practitioner

## 2021-12-13 ENCOUNTER — Encounter: Payer: Self-pay | Admitting: Nurse Practitioner

## 2021-12-13 ENCOUNTER — Other Ambulatory Visit: Payer: Self-pay

## 2021-12-13 DIAGNOSIS — G8929 Other chronic pain: Secondary | ICD-10-CM

## 2021-12-13 DIAGNOSIS — R519 Headache, unspecified: Secondary | ICD-10-CM

## 2021-12-13 DIAGNOSIS — M542 Cervicalgia: Secondary | ICD-10-CM

## 2021-12-13 MED ORDER — TIZANIDINE HCL 4 MG PO TABS
4.0000 mg | ORAL_TABLET | Freq: Four times a day (QID) | ORAL | 3 refills | Status: DC | PRN
  Filled 2021-12-13 – 2022-04-13 (×2): qty 60, 15d supply, fill #0

## 2021-12-13 MED ORDER — NORTRIPTYLINE HCL 10 MG PO CAPS
10.0000 mg | ORAL_CAPSULE | Freq: Every day | ORAL | 1 refills | Status: DC
  Filled 2021-12-13 – 2022-06-22 (×2): qty 90, 90d supply, fill #0

## 2021-12-21 ENCOUNTER — Other Ambulatory Visit: Payer: Self-pay

## 2022-02-03 ENCOUNTER — Ambulatory Visit: Payer: Self-pay | Admitting: *Deleted

## 2022-02-03 NOTE — Telephone Encounter (Signed)
Pt scheduled for video visit

## 2022-02-03 NOTE — Telephone Encounter (Signed)
Interpreter Lauda ID #[id number] assisted with communication with patient.  Chief Complaint: dizziness , elevated BP hip pain  Symptoms: BP 140/92 HR 76. C/o dizziness when standing and bending over. Saturday reports blurred vision . Frequency: started Saturday 01/28/22 Pertinent Negatives: Patient denies chest pain no difficulty breathing, no fever,  Disposition: [] ED /[x] Urgent Care (no appt availability in office) / [] Appointment(In office/virtual)/ []  [place] [facility]/ [] Home Care/ [] Refused Recommended Disposition /[] [place] [facility]/ []  Follow-up with PCP Additional Notes:  Recommended UC /ED due to no available OV mobile unit not available . Patient would like a call back to schedule appt earliest available . Please advise  Reason for Disposition  [1] MODERATE dizziness (e.g., interferes with normal activities) AND [2] has NOT been evaluated by doctor (or NP/PA) for this  (Exception: Dizziness caused by heat exposure, sudden standing, or poor fluid intake.)  Answer Assessment - Initial Assessment Questions 1. DESCRIPTION: "Describe your dizziness."     Hard to describe. Happens when standing and or bending  2. LIGHTHEADED: "Do you feel lightheaded?" (e.g., somewhat faint, woozy, weak upon standing)     Dizzy when standing or bending over  3. VERTIGO: "Do you feel like either you or the room is spinning or tilting?" (i.e. vertigo)     na 4. SEVERITY: "How bad is it?"  "Do you feel like you are going to faint?" "Can you stand and walk?"   - MILD: Feels slightly dizzy, but walking normally.   - MODERATE: Feels unsteady when walking, but not falling; interferes with normal activities (e.g., school, work).   - SEVERE: Unable to walk without falling, or requires assistance to walk without falling; feels like passing out now.      Na  5. ONSET:  "When did the dizziness begin?"     Started this week Saturday  6. AGGRAVATING FACTORS: "Does anything make it worse?" (e.g., standing,  change in head position)     Standing change in head position 7. HEART RATE: "Can you tell me your heart rate?" "How many beats in 15 seconds?"  (Note: not all patients can do this)       76 8. CAUSE: "What do you think is causing the dizziness?"     Not sure  9. RECURRENT SYMPTOM: "Have you had dizziness before?" If Yes, ask: "When was the last time?" "What happened that time?"     na 10. OTHER SYMPTOMS: "Do you have any other symptoms?" (e.g., fever, chest pain, vomiting, diarrhea, bleeding)       No fever, no chest pain no difficulty breathing  11. PREGNANCY: "Is there any chance you are pregnant?" "When was your last menstrual period?"       na  Protocols used: Dizziness - Lightheadedness-A-AH

## 2022-02-03 NOTE — Telephone Encounter (Signed)
Routing to CMA 

## 2022-02-07 ENCOUNTER — Encounter: Payer: Self-pay | Admitting: Nurse Practitioner

## 2022-02-07 ENCOUNTER — Other Ambulatory Visit: Payer: Self-pay

## 2022-02-07 ENCOUNTER — Telehealth (HOSPITAL_BASED_OUTPATIENT_CLINIC_OR_DEPARTMENT_OTHER): Payer: No Typology Code available for payment source | Admitting: Nurse Practitioner

## 2022-02-07 DIAGNOSIS — E785 Hyperlipidemia, unspecified: Secondary | ICD-10-CM

## 2022-02-07 DIAGNOSIS — G8929 Other chronic pain: Secondary | ICD-10-CM

## 2022-02-07 DIAGNOSIS — M25552 Pain in left hip: Secondary | ICD-10-CM

## 2022-02-07 DIAGNOSIS — R7401 Elevation of levels of liver transaminase levels: Secondary | ICD-10-CM

## 2022-02-07 DIAGNOSIS — R42 Dizziness and giddiness: Secondary | ICD-10-CM

## 2022-02-07 DIAGNOSIS — Z1159 Encounter for screening for other viral diseases: Secondary | ICD-10-CM

## 2022-02-07 MED ORDER — GABAPENTIN 300 MG PO CAPS
300.0000 mg | ORAL_CAPSULE | Freq: Every day | ORAL | 0 refills | Status: DC
  Filled 2022-02-07: qty 90, 90d supply, fill #0

## 2022-02-07 NOTE — Progress Notes (Signed)
Virtual Visit via Telephone Note  I discussed the limitations, risks, security and privacy concerns of performing an evaluation and management service by telephone and the availability of in person appointments. I also discussed with the patient that there may be a patient responsible charge related to this service. The patient expressed understanding and agreed to proceed.    I connected with [name] on 02/07/22  at   9:30 AM EDT  EDT by telephone and verified that I am speaking with the correct person using two identifiers.  Location of Patient: Private Residence   Location of Provider: [facility]    Persons participating in Telemedicine visit: Bertram Denver FNP-BC Nicki Jacki Cones  Spanish Interpreter Marisue Ivan [phone]   History of Present Illness: Telemedicine visit for: Dizziness Notes onset of dizziness which started a few weeks ago. Only occurs when she is outside in the sun. She is currently taking amlodipine 5 mg daily for HTN. She states the dizziness resolves on its own after she comes inside of gets out of the sun. Denies any other associated symptoms.      Past Medical History:  Diagnosis Date   High cholesterol 2013    Hypertension    Obesity (BMI 30-39.9)    Urinary incontinence, mixed     Past Surgical History:  Procedure Laterality Date   BREAST EXCISIONAL BIOPSY Left    BREAST LUMPECTOMY     EYE SURGERY  2012   TUBAL LIGATION  2000    Family History  Problem Relation Age of Onset   Cancer Neg Hx    Heart disease Neg Hx    Diabetes Neg Hx    Hypertension Neg Hx    Breast cancer Neg Hx     Social History   Socioeconomic History   Marital status: Married    Spouse name: Not on file   Number of children: 5   Years of education: 2    Highest education level: 2nd grade  Occupational History   Occupation: Unemployed   Tobacco Use   Smoking status: Never   Smokeless tobacco: Never  Vaping Use    Vaping Use: Never used  Substance and Sexual Activity   Alcohol use: No   Drug use: No   Sexual activity: Yes    Birth control/protection: Surgical  Other Topics Concern   Not on file  Social History Narrative   Lives with husband and brother in [place].    5 children grown and married.    Cannot read or write in spanish.    Husband can read and write in spanish or english.    Social Determinants of Health   Financial Resource Strain: Not on file  Food Insecurity: No Food Insecurity (08/23/2021)   Hunger Vital Sign    Worried About Running Out of Food in the Last Year: Never true    Ran Out of Food in the Last Year: Never true  Transportation Needs: No Transportation Needs (08/23/2021)   PRAPARE - Administrator, Civil Service (Medical): No    Lack of Transportation (Non-Medical): No  Physical Activity: Not on file  Stress: Not on file  Social Connections: Not on file     Observations/Objective: Awake, alert and oriented x 3   Review of Systems  Constitutional:  Negative for fever, malaise/fatigue and weight loss.  HENT: Negative.  Negative for nosebleeds.   Eyes: Negative.  Negative for blurred vision, double vision and photophobia.  Respiratory: Negative.  Negative for cough and shortness of breath.   Cardiovascular: Negative.  Negative for chest pain, palpitations and leg swelling.  Gastrointestinal: Negative.  Negative for heartburn, nausea and vomiting.  Musculoskeletal:  Positive for joint pain. Negative for myalgias.  Neurological:  Positive for dizziness. Negative for focal weakness, seizures and headaches.  Psychiatric/Behavioral: Negative.  Negative for suicidal ideas.     Assessment and Plan: Diagnoses and all orders for this visit:  Dizziness Avoid direct sunlight or prolonged periods of extreme heat while taking blood pressure medication.  Stay hydrated  Wear sunhat when outdoors  Chronic left hip pain -     gabapentin (NEURONTIN) 300 MG capsule;  Take 1 capsule (300 mg total) by mouth at bedtime. For left hip pain  Need for hepatitis C screening test -     HCV Ab w Reflex to Quant PCR; Future  Transaminitis -     Hepatic Function Panel; Future  Dyslipidemia, goal LDL below 100 -     Lipid panel; Future     Follow Up Instructions Return if symptoms worsen or fail to improve.     I discussed the assessment and treatment plan with the patient. The patient was provided an opportunity to ask questions and all were answered. The patient agreed with the plan and demonstrated an understanding of the instructions.   The patient was advised to call back or seek an in-person evaluation if the symptoms worsen or if the condition fails to improve as anticipated.  I provided 13 minutes of non-face-to-face time during this encounter including median intraservice time, reviewing previous notes, labs, imaging, medications and explaining diagnosis and management.  Claiborne Rigg, FNP-BC

## 2022-02-08 ENCOUNTER — Other Ambulatory Visit: Payer: Self-pay

## 2022-03-24 ENCOUNTER — Other Ambulatory Visit (HOSPITAL_COMMUNITY): Payer: Self-pay

## 2022-04-13 ENCOUNTER — Other Ambulatory Visit: Payer: Self-pay

## 2022-06-22 ENCOUNTER — Other Ambulatory Visit: Payer: Self-pay

## 2022-06-28 ENCOUNTER — Other Ambulatory Visit: Payer: Self-pay

## 2022-06-28 ENCOUNTER — Other Ambulatory Visit: Payer: Self-pay | Admitting: Nurse Practitioner

## 2022-06-28 ENCOUNTER — Ambulatory Visit: Payer: Self-pay | Admitting: *Deleted

## 2022-06-28 DIAGNOSIS — I10 Essential (primary) hypertension: Secondary | ICD-10-CM

## 2022-06-28 MED ORDER — AMLODIPINE BESYLATE 5 MG PO TABS
5.0000 mg | ORAL_TABLET | Freq: Every day | ORAL | 0 refills | Status: DC
  Filled 2022-06-28: qty 90, 90d supply, fill #0

## 2022-06-28 NOTE — Telephone Encounter (Signed)
  Chief Complaint: LLQ pain radiating to toes- mild pain level Symptoms: LLQ pain radiated to toes- using gabapentin for the pain- worse over the last week Frequency: chronic- worse 1 week Pertinent Negatives: Patient denies , back pain, diarrhea, fever, urination pain, vomiting  Disposition: [] ED /[] Urgent Care (no appt availability in office) / [x] Appointment(In office/virtual)/ []  [place] [facility]/ [] Home Care/ [] Refused Recommended Disposition /[] [facility]/ []  Follow-up with PCP Additional Notes: Patient has been scheduled for appointment Friday- patient advised call office with worsening symptoms

## 2022-06-28 NOTE — Telephone Encounter (Signed)
Requested Prescriptions  Pending Prescriptions Disp Refills   amLODipine (NORVASC) 5 MG tablet 90 tablet 0    Sig: Take 1 tablet (5 mg total) by mouth daily. DOSE CANGE     Cardiovascular: Calcium Channel Blockers 2 Passed - 06/28/2022 12:28 PM      Passed - Last BP in normal range    BP Readings from Last 1 Encounters:  11/21/21 121/83         Passed - Last Heart Rate in normal range    Pulse Readings from Last 1 Encounters:  11/21/21 76         Passed - Valid encounter within last 6 months    Recent Outpatient Visits           4 months ago [place], [place] W, NP   6 months ago Acute nonintractable headache, unspecified headache type   [place], Vernia Buff, NP   7 months ago Prediabetes   [facility], Vernia Buff, NP   8 months ago Primary hypertension   [place], Vernia Buff, NP   9 months ago Primary hypertension   [place], RPH-CPP       Future Appointments             In 2 days Ladell Pier, MD [place]   In 3 weeks Gildardo Pounds, NP [place]

## 2022-06-28 NOTE — Telephone Encounter (Signed)
Reason for Disposition  [1] MILD pain (e.g., does not interfere with normal activities) AND [2] pain comes and goes (cramps) AND [3] present > 48 hours  (Exception: This same abdominal pain is a chronic symptom recurrent or ongoing AND present > 4 weeks.)  Answer Assessment - Initial Assessment Questions 1. LOCATION: "Where does it hurt?"      Left side- close to leg- burning sensation 2. RADIATION: "Does the pain shoot anywhere else?" (e.g., chest, back)     Sometimes extend to toes 3. ONSET: "When did the pain begin?" (e.g., minutes, hours or days ago)      1 week- more in evening 4. SUDDEN: "Gradual or sudden onset?"     sudden 5. PATTERN "Does the pain come and go, or is it constant?"    - If it comes and goes: "How long does it last?" "Do you have pain now?"     (Note: Comes and goes means the pain is intermittent. It goes away completely between bouts.)    - If constant: "Is it getting better, staying the same, or getting worse?"      (Note: Constant means the pain never goes away completely; most serious pain is constant and gets worse.)      Constant- medication helps at times 6. SEVERITY: "How bad is the pain?"  (e.g., Scale 1-10; mild, moderate, or severe)    - MILD (1-3): Doesn't interfere with normal activities, abdomen soft and not tender to touch.     - MODERATE (4-7): Interferes with normal activities or awakens from sleep, abdomen tender to touch.     - SEVERE (8-10): Excruciating pain, doubled over, unable to do any normal activities.       mild 7. RECURRENT SYMPTOM: "Have you ever had this type of stomach pain before?" If Yes, ask: "When was the last time?" and "What happened that time?"      In the past- 01/2002, hip pain 8. CAUSE: "What do you think is causing the stomach pain?"     Hip pain 9. RELIEVING/AGGRAVATING FACTORS: "What makes it better or worse?" (e.g., antacids, bending or twisting motion, bowel movement)     Taking gabapentin 10. OTHER SYMPTOMS: "Do you  have any other symptoms?" (e.g., back pain, diarrhea, fever, urination pain, vomiting)       no  Protocols used: Abdominal Pain - Female-A-AH

## 2022-06-28 NOTE — Telephone Encounter (Signed)
Medication Refill - Medication: amLODipine (NORVASC) 5 MG tablet  Has the patient contacted their pharmacy? Yes.    (Agent: If yes, when and what did the pharmacy advise?) Contact PCP's office   Preferred Pharmacy (with phone number or street name):  [place] / [phone] Has the patient been seen for an appointment in the last year OR does the patient have an upcoming appointment? Yes.    Agent: Please be advised that RX refills may take up to 3 business days. We ask that you follow-up with your pharmacy.

## 2022-06-30 ENCOUNTER — Encounter: Payer: Self-pay | Admitting: Internal Medicine

## 2022-06-30 ENCOUNTER — Other Ambulatory Visit: Payer: Self-pay

## 2022-06-30 ENCOUNTER — Ambulatory Visit: Payer: Self-pay | Attending: Internal Medicine | Admitting: Internal Medicine

## 2022-06-30 VITALS — BP 143/84 | HR 62 | Temp 97.7°F | Ht 60.0 in | Wt 164.0 lb

## 2022-06-30 DIAGNOSIS — M5432 Sciatica, left side: Secondary | ICD-10-CM

## 2022-06-30 DIAGNOSIS — I10 Essential (primary) hypertension: Secondary | ICD-10-CM

## 2022-06-30 MED ORDER — PREDNISONE 10 MG PO TABS
ORAL_TABLET | ORAL | 0 refills | Status: AC
  Filled 2022-06-30: qty 10, 4d supply, fill #0

## 2022-06-30 NOTE — Patient Instructions (Signed)
Citica Sciatica  La citica es el dolor, debilidad, hormigueo o prdida de la sensibilidad (adormecimiento) a lo largo del nervio citico. El nervio citico comienza en la parte inferior de la espalda y desciende por la parte posterior de cada pierna. La citica suele afectar un lado del cuerpo. Suele desaparecer por s sola o con tratamiento. A veces, la citica puede volver a manifestarse. Cules son las causas? Esta afeccin se produce cuando el nervio citico se comprime o se ejerce presin sobre l. Las causas de esto pueden ser las siguientes: Un disco que sobresale demasiado entre los huesos de la columna vertebral (hernia de disco). Cambios en los discos vertebrales debido al envejecimiento. Una afeccin en un msculo de las nalgas. Un crecimiento seo adicional cerca del nervio citico. Una rotura (fractura) de la zona que est entre los huesos de la cadera (pelvis). Embarazo. Tumor. Esto es poco frecuente. Qu incrementa el riesgo? Es ms probable que tengan esta afeccin las personas que: Practican deportes que ponen presin o tensin sobre la columna vertebral. Tienen poca fuerza y facilidad de movimiento (flexibilidad). Han tenido una lesin en la espalda o una ciruga de espalda. Permanecen sentadas durante largos perodos. Realizan actividades que implican agacharse o levantar objetos una y otra vez. Tienen mucho sobrepeso (son obesas). Cules son los signos o sntomas? Los sntomas pueden variar de leves a muy graves. Pueden incluir los siguientes: Cualquiera de los siguientes problemas en la parte inferior de la espalda, piernas, cadera o nalgas: Hormigueo leve, prdida de la sensibilidad o dolor sordo. Sensacin de ardor. Dolor agudo. Prdida de la sensibilidad en la parte posterior de la pantorrilla o la planta del pie. Debilidad en las piernas. Dolor muy intenso en la espalda que dificulta el movimiento. Estos sntomas pueden empeorar al toser, estornudar o  rer. Tambin pueden empeorar al sentarse o estar de pie durante largos perodos. Cmo se trata? A menudo, esta afeccin mejora sin tratamiento. Sin embargo, el tratamiento puede incluir: Cambiar de actividad fsica o reducirla cuando siente dolor. Hacer ejercicio, incluido el fortalecimiento y la elongacin. Aplicar hielo o calor sobre la zona afectada. Administrar inyecciones de medicamentos para reducir el dolor y la hinchazn, o para relajar los msculos. Ciruga. Siga estas instrucciones en su casa: Medicamentos Use los medicamentos de venta libre y los recetados solamente como se lo haya indicado el mdico. Pregunte al mdico si debe evitar conducir o utilizar mquinas mientras toma los medicamentos. Control del dolor     Si se lo indican, aplique hielo en la zona afectada. Para hacer esto: Ponga el hielo en una bolsa plstica. Coloque una toalla entre la piel y la bolsa. Aplique el hielo durante 20 minutos, 2 a 3 veces por da. Si la piel se le pone de color rojo brillante, quite el hielo de inmediato para evitar daos en la piel. El riesgo de dao en la piel es mayor si no puede sentir dolor, calor o fro. Si se lo indican, aplique calor en la zona afectada. Hgalo con la frecuencia que le haya indicado el mdico. Use la fuente de calor que el mdico le indique, por ejemplo, una compresa de calor hmedo o una almohadilla trmica. Coloque una toalla entre la piel y la fuente de calor. Aplique calor durante 20 a 30 minutos. Si la piel se le pone de color rojo brillante, retire el calor de inmediato para evitar quemaduras. El riesgo de quemaduras es mayor si no puede sentir el dolor, el calor o el fro. Actividad    Retome sus actividades normales cuando el mdico le diga que es seguro. Evite las actividades que empeoran los sntomas. Descanse por breves perodos durante el da. Cuando descanse durante perodos ms largos, haga alguna actividad fsica o un estiramiento entre los  perodos de descanso. Evite estar sentado durante largos perodos sin moverse. Levntese y muvase al menos una vez cada hora. Haga suavemente los ejercicios que le indique el mdico. No levante ningn objeto que pese ms de 10 libras (4.5 kg). Aunque no tenga sntomas, evite levantar objetos pesados. Evite levantar objetos pesados de forma repetida. Al levantar objetos, hgalo siempre de una forma que sea segura para su cuerpo. Para esto, debe hacer lo siguiente: Flexione las rodillas. Mantenga el objeto cerca del cuerpo. No gire el cuerpo. Instrucciones generales Mantenga un peso saludable. Use calzado cmodo, que le d soporte al pie. Evite usar tacones. Evite dormir sobre un colchn que sea demasiado blando o demasiado duro. Es posible que sienta menos dolor si duerme en un colchn con apoyo suficientemente firme para la espalda. Comunquese con un mdico si: El dolor no se alivia con los medicamentos. El dolor no mejora. El dolor empeora. Los sntomas duran ms de 4 semanas. Pierde peso sin proponrselo. Solicite ayuda de inmediato si: No puede controlar el pis (miccin) ni la evacuacin de la materia fecal (deposiciones). Tiene debilidad en alguna de estas zonas, y la debilidad empeora: La parte inferior de la espalda. La zona que se encuentra entre los huesos de las caderas. Las nalgas. Las piernas. Siente irritacin o inflamacin en la espalda. Tiene sensacin de ardor al orinar. Resumen La citica es el dolor, debilidad, hormigueo o prdida de la sensibilidad (adormecimiento) a lo largo del nervio citico. Esto puede incluir la parte inferior de la espala, las piernas, las caderas y los glteos. Esta afeccin se produce cuando el nervio citico se comprime o se ejerce presin sobre l. El tratamiento a menudo incluye reposo, ejercicio, medicamentos y aplicar hielo o calor en la zona afectada. Esta informacin no tiene como fin reemplazar el consejo del mdico. Asegrese de  hacerle al mdico cualquier pregunta que tenga. Document Revised: 10/11/2021 Document Reviewed: 10/11/2021 Elsevier Patient Education  2023 Elsevier Inc.  

## 2022-06-30 NOTE — Progress Notes (Signed)
Patient ID: Sheri Mann, female    DOB: [birth year]  MRN: [id number]  CC: Hip Pain (L hip pain that radiates down to leg X1 week. Burning on bottom left abdomen/Meds causing excessive drowsiness & vomiting. /Not taking BP meds due to low home BP readings)   Subjective: Sheri Mann is a 57 y.o. female who presents for UC.  PCP is [facility] [place] Her concerns today include:  Pt with hx of HTN, HL, obesity, chronic neck pain, chronic LT hip pain on Gabapentin, headaches  Presents c/o pain in the LT hip which is chronic but worse x 1 wk Starts in the lower back and radiates down the back of leg to the knee. Pain intermittent; more at nights and when sitting down No numbness/tingling in leg.  No incontinence of urine/bowel Not using anything for pain. On Gabapentin which she takes at nights around 8 p.m but makes her to drowsy in the mornings and makes her feel nauseated.  D/C taking 3 days ago because of that.   BP elevated today.   Reports being out Norvasc x 1 wk.  RF sent by NP Meredeth Ide to pharmacy 2 days ago.  She plans to pick up today  Patient Active Problem List   Diagnosis Date Noted   Acute nonintractable headache 06/29/2021   Acute non-recurrent frontal sinusitis 03/30/2021   Language barrier 02/06/2017   Post-menopausal bleeding 11/01/2016   Thickened endometrium 11/01/2016   Gastroesophageal reflux disease 10/20/2016   Hypokalemia 10/20/2016   Abdominal pain, epigastric 08/05/2015   Perimenopausal vasomotor symptoms 01/28/2015   Positive D dimer 08/24/2014   Left medial knee pain 08/20/2014   Low back pain radiating to left leg 04/10/2014   Allergic rhinitis 04/10/2014     Current Outpatient Medications on File Prior to Visit  Medication Sig Dispense Refill   amLODipine (NORVASC) 5 MG tablet Take 1 tablet (5 mg total) by mouth daily. DOSE CANGE 90 tablet 0   fluticasone (FLONASE) 50 MCG/ACT nasal spray Place 2 sprays into both nostrils daily.  (Patient not taking: Reported on 06/30/2022) 16 g 6   gabapentin (NEURONTIN) 300 MG capsule Take 1 capsule (300 mg total) by mouth at bedtime. For left hip pain (Patient not taking: Reported on 06/30/2022) 90 capsule 0   hydrOXYzine (ATARAX/VISTARIL) 10 MG tablet Take 1 tablet (10 mg total) by mouth 3 (three) times daily as needed. FOR ANXIETY (Patient not taking: Reported on 06/30/2022) 60 tablet 1   nortriptyline (PAMELOR) 10 MG capsule Take 1 capsule (10 mg total) by mouth at bedtime. For headaches (Patient not taking: Reported on 06/30/2022) 90 capsule 1   omeprazole (PRILOSEC) 20 MG capsule Take 1 capsule (20 mg total) by mouth daily. (Patient not taking: Reported on 06/30/2022) 30 capsule 3   tiZANidine (ZANAFLEX) 4 MG tablet Take 1 tablet (4 mg total) by mouth every 6 (six) hours as needed for muscle spasms. FOR NECK PAIN (Patient not taking: Reported on 06/30/2022) 60 tablet 3   No current facility-administered medications on file prior to visit.    Allergies  Allergen Reactions   Lisinopril Cough    Social History   Socioeconomic History   Marital status: Married    Spouse name: Not on file   Number of children: 5   Years of education: 2    Highest education level: 2nd grade  Occupational History   Occupation: Unemployed   Tobacco Use   Smoking status: Never   Smokeless tobacco: Never  Vaping Use  Vaping Use: Never used  Substance and Sexual Activity   Alcohol use: No   Drug use: No   Sexual activity: Yes    Birth control/protection: Surgical  Other Topics Concern   Not on file  Social History Narrative   Lives with husband and brother in [place].    5 children grown and married.    Cannot read or write in [place].    Husband can read and write in [place] or [place].    Social Determinants of Health   Financial Resource Strain: Not on file  Food Insecurity: No Food Insecurity (08/23/2021)   Hunger Vital Sign    Worried About Running Out of Food in the Last Year: Never  true    Ran Out of Food in the Last Year: Never true  Transportation Needs: No Transportation Needs (08/23/2021)   PRAPARE - Hydrologist (Medical): No    Lack of Transportation (Non-Medical): No  Physical Activity: Not on file  Stress: Not on file  Social Connections: Not on file  Intimate Partner Violence: Not on file    Family History  Problem Relation Age of Onset   Cancer Neg Hx    Heart disease Neg Hx    Diabetes Neg Hx    Hypertension Neg Hx    Breast cancer Neg Hx     Past Surgical History:  Procedure Laterality Date   BREAST EXCISIONAL BIOPSY Left    BREAST LUMPECTOMY     EYE SURGERY  2012   TUBAL LIGATION  2000    ROS: Review of Systems Negative except as stated above  PHYSICAL EXAM: BP (!) 143/84 (BP Location: Left Arm, Patient Position: Sitting, Cuff Size: Large)   Pulse 62   Temp 97.7 F (36.5 C) (Oral)   Ht 5' (1.524 m)   Wt 164 lb (74.4 kg)   LMP 11/11/2016 (Exact Date)   SpO2 100%   BMI 32.03 kg/m   Physical Exam  General appearance - alert, well appearing, and in no distress Mental status - normal mood, behavior, speech, dress, motor activity, and thought processes Neurological -power 5/5 bilaterally in both lower extremities.  Gross sensation intact in the lower extremities.  Gait is stable.  Brisk knee jerk reflex Musculoskeletal -no tenderness on palpation of the lumbar spine.  Straight leg raise reproduces pain in the left lower back.      Latest Ref Rng & Units 11/21/2021    2:48 PM 08/20/2021    3:39 PM 06/29/2021    3:34 PM  CMP  Glucose 70 - 99 mg/dL 132  119  93   BUN 6 - 24 mg/dL 14  12  9    Creatinine 0.57 - 1.00 mg/dL 0.73  0.79  0.67   Sodium 134 - 144 mmol/L 142  139  142   Potassium 3.5 - 5.2 mmol/L 3.9  4.1  4.4   Chloride 96 - 106 mmol/L 106  109  104   CO2 20 - 29 mmol/L 24  23  23    Calcium 8.7 - 10.2 mg/dL 10.0  9.6  9.6   Total Protein 6.0 - 8.5 g/dL 7.1     Total Bilirubin 0.0 - 1.2 mg/dL  0.3     Alkaline Phos 44 - 121 IU/L 151     AST 0 - 40 IU/L 20     ALT 0 - 32 IU/L 17      Lipid Panel     Component Value Date/Time  CHOL 179 11/05/2020 1151   TRIG 108 11/05/2020 1151   HDL 49 11/05/2020 1151   CHOLHDL 3.7 11/05/2020 1151   CHOLHDL 3.9 04/09/2014 0000   VLDL 28 04/09/2014 0000   LDLCALC 110 (H) 11/05/2020 1151    CBC    Component Value Date/Time   WBC 6.2 08/20/2021 1539   RBC 4.49 08/20/2021 1539   HGB 12.9 08/20/2021 1539   HGB 13.1 06/29/2021 1534   HCT 39.9 08/20/2021 1539   HCT 40.0 06/29/2021 1534   PLT 178 08/20/2021 1539   PLT 168 06/29/2021 1534   MCV 88.9 08/20/2021 1539   MCV 88 06/29/2021 1534   MCH 28.7 08/20/2021 1539   MCHC 32.3 08/20/2021 1539   RDW 12.0 08/20/2021 1539   RDW 12.4 06/29/2021 1534   LYMPHSABS 1.8 05/02/2019 0929   MONOABS 0.4 12/27/2016 0636   EOSABS 0.0 05/02/2019 0929   BASOSABS 0.0 05/02/2019 0929    ASSESSMENT AND PLAN:  1. Sciatica of left side Avoid any heavy lifting, pushing or pulling for the next 2 weeks. Trial of a heating pad twice a day for 15 minutes .  Gabapentin due to side effects that she stated above. - predniSONE (DELTASONE) 10 MG tablet; Take 4 tablets (40 mg total) by mouth daily for 1 day, THEN 3 tablets (30 mg total) daily for 1 day, THEN 2 tablets (20 mg total) daily for 1 day, THEN 1 tablet (10 mg total) daily for 1 day.  Dispense: 10 tablet; Refill: 0  2. Essential hypertension Not at goal.  She has been out of the medication.  Patient to pick up refill on amlodipine at the pharmacy today.   [employer] Language interpreter used during this encounter. #[id number], diego  Patient was given the opportunity to ask questions.  Patient verbalized understanding of the plan and was able to repeat key elements of the plan.   This documentation was completed using Radio producer.  Any transcriptional errors are unintentional.  No orders of the defined types were placed in this  encounter.    Requested Prescriptions    No prescriptions requested or ordered in this encounter    No follow-ups on file.  Karle Plumber, MD, FACP

## 2022-07-24 ENCOUNTER — Encounter: Payer: Self-pay | Admitting: Nurse Practitioner

## 2022-07-24 ENCOUNTER — Other Ambulatory Visit: Payer: Self-pay

## 2022-07-24 ENCOUNTER — Ambulatory Visit: Payer: Self-pay | Attending: Nurse Practitioner | Admitting: Nurse Practitioner

## 2022-07-24 VITALS — BP 128/82 | HR 77 | Ht 60.0 in | Wt 167.4 lb

## 2022-07-24 DIAGNOSIS — R7303 Prediabetes: Secondary | ICD-10-CM

## 2022-07-24 DIAGNOSIS — R102 Pelvic and perineal pain: Secondary | ICD-10-CM

## 2022-07-24 DIAGNOSIS — Z0000 Encounter for general adult medical examination without abnormal findings: Secondary | ICD-10-CM

## 2022-07-24 DIAGNOSIS — M25552 Pain in left hip: Secondary | ICD-10-CM

## 2022-07-24 DIAGNOSIS — G8929 Other chronic pain: Secondary | ICD-10-CM

## 2022-07-24 DIAGNOSIS — Z23 Encounter for immunization: Secondary | ICD-10-CM

## 2022-07-24 MED ORDER — TRAMADOL HCL 50 MG PO TABS
50.0000 mg | ORAL_TABLET | Freq: Three times a day (TID) | ORAL | 0 refills | Status: DC | PRN
  Filled 2022-07-24: qty 60, 10d supply, fill #0

## 2022-07-24 NOTE — Progress Notes (Unsigned)
Assessment & Plan:  Sheri Mann was seen today for annual exam.  Diagnoses and all orders for this visit:  Encounter for annual physical exam  Pelvic pain -     US PELVIC COMPLETE WITH TRANSVAGINAL; Future -     traMADol (ULTRAM) 50 MG tablet; Take 1-2 tablets (50-100 mg total) by mouth every 8 (eight) hours as needed.  Chronic left hip pain Refer to ortho -     DG Hip Unilat W OR W/O Pelvis Min 4 Views Left; Future -     traMADol (ULTRAM) 50 MG tablet; Take 1-2 tablets (50-100 mg total) by mouth every 8 (eight) hours as needed.  Prediabetes -     Hemoglobin A1c  Need for immunization against influenza -     Flu Vaccine QUAD 69mo+IM (Fluarix, Fluzone & Alfiuria Quad PF)    Patient has been counseled on age-appropriate routine health concerns for screening and prevention. These are reviewed and up-to-date. Referrals have been placed accordingly. Immunizations are up-to-date or declined.    Subjective:   Chief Complaint  Patient presents with   Annual Exam   HPI Sheri Mann 57 y.o. female presents to office today for annual physical exam.  VRI was used to communicate directly with patient for the entire encounter including providing detailed patient instructions.    Abdominal Pain: Patient complains of abdominal pain. The pain is described as aching. Pain is located in the lower left pelvis with radiation to left hip. Onset was several months ago. Symptoms have been unchanged since. Aggravating factors: none.  Alleviating factors: avoidance of activities. Associated symptoms: none. The patient denies constipation, diarrhea, dysuria, fever, frequency, hematochezia, hematuria, melena, nausea, and vomiting.  Chronic Left Hip pain Ongoing for a few years. Unrelated to any injury or trauma. Associated symptoms: lower left pelvic pain.  Medications tried: meloxicam 7-15 mg, gabapentin. Not effective.  Aggravating factors: sitting, lying down for long periods of time.   Also notes associated back pain when she is experiecing the hip pain as well as left sciatica. Pain described as aching and burning. Onset of symptoms since last year. Hip xray from a few years ago was negative for any acute process.     Review of Systems  Constitutional:  Negative for fever, malaise/fatigue and weight loss.  HENT: Negative.  Negative for nosebleeds.   Eyes: Negative.  Negative for blurred vision, double vision and photophobia.  Respiratory: Negative.  Negative for cough and shortness of breath.   Cardiovascular: Negative.  Negative for chest pain, palpitations and leg swelling.  Gastrointestinal:  Positive for abdominal pain. Negative for blood in stool, constipation, diarrhea, heartburn, melena, nausea and vomiting.  Genitourinary: Negative.   Musculoskeletal:  Positive for joint pain. Negative for myalgias.  Skin: Negative.   Neurological: Negative.  Negative for dizziness, focal weakness, seizures and headaches.  Endo/Heme/Allergies: Negative.   Psychiatric/Behavioral: Negative.  Negative for suicidal ideas.     Past Medical History:  Diagnosis Date   High cholesterol 2013    Hypertension    Obesity (BMI 30-39.9)    Urinary incontinence, mixed     Past Surgical History:  Procedure Laterality Date   BREAST EXCISIONAL BIOPSY Left    BREAST LUMPECTOMY     EYE SURGERY  2012   TUBAL LIGATION  2000    Family History  Problem Relation Age of Onset   Cancer Neg Hx    Heart disease Neg Hx    Diabetes Neg Hx    Hypertension Neg Hx  Breast cancer Neg Hx     Social History Reviewed with no changes to be made today.   Outpatient Medications Prior to Visit  Medication Sig Dispense Refill   amLODipine (NORVASC) 5 MG tablet Take 1 tablet (5 mg total) by mouth daily. DOSE CANGE 90 tablet 0   hydrOXYzine (ATARAX/VISTARIL) 10 MG tablet Take 1 tablet (10 mg total) by mouth 3 (three) times daily as needed. FOR ANXIETY (Patient not taking: Reported on 06/30/2022) 60  tablet 1   omeprazole (PRILOSEC) 20 MG capsule Take 1 capsule (20 mg total) by mouth daily. (Patient not taking: Reported on 06/30/2022) 30 capsule 3   fluticasone (FLONASE) 50 MCG/ACT nasal spray Place 2 sprays into both nostrils daily. (Patient not taking: Reported on 06/30/2022) 16 g 6   nortriptyline (PAMELOR) 10 MG capsule Take 1 capsule (10 mg total) by mouth at bedtime. For headaches (Patient not taking: Reported on 06/30/2022) 90 capsule 1   tiZANidine (ZANAFLEX) 4 MG tablet Take 1 tablet (4 mg total) by mouth every 6 (six) hours as needed for muscle spasms. FOR NECK PAIN (Patient not taking: Reported on 06/30/2022) 60 tablet 3   No facility-administered medications prior to visit.    Allergies  Allergen Reactions   Lisinopril Cough       Objective:    BP 128/82   Pulse 77   Ht 5' (1.524 m)   Wt 167 lb 6.4 oz (75.9 kg)   LMP 11/11/2016 (Exact Date)   SpO2 97%   BMI 32.69 kg/m  Wt Readings from Last 3 Encounters:  07/24/22 167 lb 6.4 oz (75.9 kg)  06/30/22 164 lb (74.4 kg)  11/21/21 168 lb (76.2 kg)    Physical Exam Constitutional:      Appearance: She is well-developed.  HENT:     Head: Normocephalic and atraumatic.     Right Ear: Hearing, tympanic membrane, ear canal and external ear normal.     Left Ear: Hearing, tympanic membrane, ear canal and external ear normal.     Nose: Nose normal.     Right Turbinates: Not enlarged.     Left Turbinates: Not enlarged.     Mouth/Throat:     Lips: Pink.     Mouth: Mucous membranes are moist.     Dentition: No dental tenderness, gingival swelling, dental abscesses or gum lesions.     Pharynx: No oropharyngeal exudate.  Eyes:     General: No scleral icterus.       Right eye: No discharge.     Extraocular Movements: Extraocular movements intact.     Conjunctiva/sclera: Conjunctivae normal.     Pupils: Pupils are equal, round, and reactive to light.  Neck:     Thyroid: No thyromegaly.     Trachea: No tracheal deviation.   Cardiovascular:     Rate and Rhythm: Normal rate and regular rhythm.     Heart sounds: Normal heart sounds. No murmur heard.    No friction rub.  Pulmonary:     Effort: Pulmonary effort is normal. No accessory muscle usage or respiratory distress.     Breath sounds: Normal breath sounds. No decreased breath sounds, wheezing, rhonchi or rales.  Abdominal:     General: Bowel sounds are normal. There is no distension.     Palpations: Abdomen is soft. There is no mass.     Tenderness: There is abdominal tenderness. There is no right CVA tenderness, left CVA tenderness, guarding or rebound.     Hernia: No hernia is  present.    Musculoskeletal:        General: No deformity. Normal range of motion.     Cervical back: Normal range of motion and neck supple.     Left hip: Tenderness present. No bony tenderness.  Lymphadenopathy:     Cervical: No cervical adenopathy.  Skin:    General: Skin is warm and dry.     Findings: No erythema.  Neurological:     Mental Status: She is alert and oriented to person, place, and time.     Cranial Nerves: No cranial nerve deficit.     Motor: Motor function is intact.     Coordination: Coordination is intact. Coordination normal.     Gait: Gait is intact.     Deep Tendon Reflexes:     Reflex Scores:      Patellar reflexes are 1+ on the right side and 1+ on the left side. Psychiatric:        Attention and Perception: Attention normal.        Mood and Affect: Mood normal.        Speech: Speech normal.        Behavior: Behavior normal.        Thought Content: Thought content normal.        Judgment: Judgment normal.          Patient has been counseled extensively about nutrition and exercise as well as the importance of adherence with medications and regular follow-up. The patient was given clear instructions to go to ER or return to medical center if symptoms don't improve, worsen or new problems develop. The patient verbalized understanding.    Follow-up: Return in about 6 months (around 01/22/2023).   Gildardo Pounds, FNP-BC [facility] and [facility], [place]   07/25/2022, 8:29 AM

## 2022-07-25 ENCOUNTER — Encounter: Payer: Self-pay | Admitting: Nurse Practitioner

## 2022-07-25 LAB — HEMOGLOBIN A1C
Est. average glucose Bld gHb Est-mCnc: 134 mg/dL
Hgb A1c MFr Bld: 6.3 % — ABNORMAL HIGH (ref 4.8–5.6)

## 2022-08-09 ENCOUNTER — Other Ambulatory Visit: Payer: Self-pay

## 2022-08-09 DIAGNOSIS — Z1231 Encounter for screening mammogram for malignant neoplasm of breast: Secondary | ICD-10-CM

## 2022-08-09 NOTE — Addendum Note (Signed)
Addended by: Demetrius Revel on: 08/09/2022 02:47 PM   Modules accepted: Orders

## 2022-08-10 ENCOUNTER — Other Ambulatory Visit: Payer: Self-pay

## 2022-08-22 ENCOUNTER — Other Ambulatory Visit: Payer: Self-pay | Admitting: Nurse Practitioner

## 2022-08-22 ENCOUNTER — Ambulatory Visit (HOSPITAL_COMMUNITY)
Admission: RE | Admit: 2022-08-22 | Discharge: 2022-08-22 | Disposition: A | Discharge status: Home / Self Care | Payer: Self-pay | Source: Ambulatory Visit | Attending: Nurse Practitioner | Admitting: Nurse Practitioner

## 2022-08-22 DIAGNOSIS — M25552 Pain in left hip: Secondary | ICD-10-CM | POA: Insufficient documentation

## 2022-08-22 DIAGNOSIS — G8929 Other chronic pain: Secondary | ICD-10-CM | POA: Insufficient documentation

## 2022-08-22 DIAGNOSIS — R102 Pelvic and perineal pain: Secondary | ICD-10-CM

## 2022-08-22 DIAGNOSIS — R7303 Prediabetes: Secondary | ICD-10-CM

## 2022-08-22 DIAGNOSIS — Z23 Encounter for immunization: Secondary | ICD-10-CM

## 2022-08-22 DIAGNOSIS — Z0000 Encounter for general adult medical examination without abnormal findings: Secondary | ICD-10-CM

## 2022-08-28 ENCOUNTER — Ambulatory Visit
Admission: RE | Admit: 2022-08-28 | Discharge: 2022-08-28 | Disposition: A | Discharge status: Home / Self Care | Payer: No Typology Code available for payment source | Source: Ambulatory Visit | Attending: Nurse Practitioner | Admitting: Nurse Practitioner

## 2022-08-28 DIAGNOSIS — Z1231 Encounter for screening mammogram for malignant neoplasm of breast: Secondary | ICD-10-CM

## 2022-11-08 ENCOUNTER — Other Ambulatory Visit: Payer: Self-pay

## 2022-11-08 ENCOUNTER — Other Ambulatory Visit: Payer: Self-pay | Admitting: Pharmacist

## 2022-11-08 DIAGNOSIS — R1013 Epigastric pain: Secondary | ICD-10-CM

## 2022-11-08 MED ORDER — OMEPRAZOLE 20 MG PO CPDR
20.0000 mg | DELAYED_RELEASE_CAPSULE | Freq: Every day | ORAL | 3 refills | Status: DC
  Filled 2022-11-08: qty 30, 30d supply, fill #0

## 2022-12-07 ENCOUNTER — Ambulatory Visit: Payer: Self-pay

## 2022-12-07 NOTE — Telephone Encounter (Signed)
  Chief Complaint: Abdominal pain and sore throat Symptoms: above Frequency: Sore throat a few days. Abdominal pain  - 2 months Pertinent Negatives: Patient denies  Disposition: [] ED /[x] Urgent Care (no appt availability in office) / [] Appointment(In office/virtual)/ []  [facility]/ [] Home Care/ [] Refused Recommended Disposition /[] [facility]/ []  Follow-up with PCP Additional Notes: Pt called with abdominal pain and sore throat. Pt states that abdominal pain is the same as she was seen for in February without resolution.  Throat pain is new. She states that it has been going on for a few days. S Throat feels very dry. No appts in office . Pt will go to UC for care.     Reason for Disposition  Abdominal pain is a chronic symptom (recurrent or ongoing AND present > 4 weeks)  [1] Sore throat is the only symptom AND [2] present > 48 hours  Answer Assessment - Initial Assessment Questions 1. LOCATION: "Where does it hurt?"      Throat and abdomen 3. ONSET: "When did the pain begin?" (e.g., minutes, hours or days ago)      2 months - gastritis  Answer Assessment - Initial Assessment Questions 1. ONSET: "When did the throat start hurting?" (Hours or days ago)      A few days 2. SEVERITY: "How bad is the sore throat?" (Scale 1-10; mild, moderate or severe)   - MILD (1-3):  Doesn't interfere with eating or normal activities.   - MODERATE (4-7): Interferes with eating some solids and normal activities.   - SEVERE (8-10):  Excruciating pain, interferes with most normal activities.   - SEVERE WITH DYSPHAGIA (10): Can't swallow liquids, drooling.     Very dry  Protocols used: Abdominal Pain - Female-A-AH, Sore Throat-A-AH

## 2022-12-07 NOTE — Telephone Encounter (Signed)
Noted  

## 2023-01-22 ENCOUNTER — Ambulatory Visit: Payer: Self-pay | Attending: Nurse Practitioner | Admitting: Nurse Practitioner

## 2023-01-22 ENCOUNTER — Other Ambulatory Visit: Payer: Self-pay

## 2023-01-22 ENCOUNTER — Telehealth: Payer: Self-pay

## 2023-01-22 ENCOUNTER — Encounter: Payer: Self-pay | Admitting: Nurse Practitioner

## 2023-01-22 VITALS — BP 122/77 | HR 66 | Ht 60.0 in | Wt 164.2 lb

## 2023-01-22 DIAGNOSIS — I10 Essential (primary) hypertension: Secondary | ICD-10-CM

## 2023-01-22 DIAGNOSIS — E785 Hyperlipidemia, unspecified: Secondary | ICD-10-CM

## 2023-01-22 DIAGNOSIS — K089 Disorder of teeth and supporting structures, unspecified: Secondary | ICD-10-CM

## 2023-01-22 DIAGNOSIS — Z1211 Encounter for screening for malignant neoplasm of colon: Secondary | ICD-10-CM

## 2023-01-22 DIAGNOSIS — R7303 Prediabetes: Secondary | ICD-10-CM

## 2023-01-22 DIAGNOSIS — D696 Thrombocytopenia, unspecified: Secondary | ICD-10-CM

## 2023-01-22 LAB — POCT GLYCOSYLATED HEMOGLOBIN (HGB A1C): HbA1c, POC (prediabetic range): 6 % (ref 5.7–6.4)

## 2023-01-22 MED ORDER — AMLODIPINE BESYLATE 5 MG PO TABS
5.0000 mg | ORAL_TABLET | Freq: Every day | ORAL | 1 refills | Status: DC
  Filled 2023-01-22: qty 90, 90d supply, fill #0

## 2023-01-22 NOTE — Telephone Encounter (Signed)
Referral sent 

## 2023-01-22 NOTE — Progress Notes (Signed)
Assessment & Plan:  Sheri Mann was seen today for medical management of chronic issues.  Diagnoses and all orders for this visit:  Prediabetes Well controlled.  -     CMP14+EGFR -     POCT glycosylated hemoglobin (Hb A1C)  Primary hypertension Instructed to take amlodipine daily and not as needed -     CMP14+EGFR -     amLODipine (NORVASC) 5 MG tablet; Take 1 tablet (5 mg total) by mouth daily. DOSE CANGE Continue all antihypertensives as prescribed.  Reminded to bring in blood pressure log for follow  up appointment.  RECOMMENDATIONS: DASH/Mediterranean Diets are healthier choices for HTN.    Dyslipidemia, goal LDL below 100 -     Lipid panel INSTRUCTIONS: Work on a low fat, heart healthy diet and participate in regular aerobic exercise program by working out at least 150 minutes per week; 5 days a week-30 minutes per day. Avoid red meat/beef/steak,  fried foods. junk foods, sodas, sugary drinks, unhealthy snacking, alcohol and smoking.  Drink at least 80 oz of water per day and monitor your carbohydrate intake daily.    Low platelet count (HCC) -     CBC with Differential/Platelet  Colon cancer screening -     Fecal occult blood, imunochemical([facility])    Patient has been counseled on age-appropriate routine health concerns for screening and prevention. These are reviewed and up-to-date. Referrals have been placed accordingly. Immunizations are up-to-date or declined.    Subjective:   Chief Complaint  Patient presents with   Medical Management of Chronic Issues   HPI Sheri Mann 57 y.o. female presents to office today for follow up to HTN and prediabetes.  VRI was used to communicate directly with patient for the entire encounter including providing detailed patient instructions.    Prediabetes A1c is at goal. She does not take any diabetes medication to help lower this.  Lab Results  Component Value Date   HGBA1C 6.0 01/22/2023    Lab  Results  Component Value Date   HGBA1C 6.3 (H) 07/24/2022   Lab Results  Component Value Date   LDLCALC 110 (H) 11/05/2020      HTN She is only taking her amlodipine as needed.  States her blood pressure is running low at home with the following systolic readings: 111-140. States she only takes her blood pressure medication when "it's up". States when her blood pressure is low 110s she feels sick. She has not taken amlodipine since the day before yesterday.  BP Readings from Last 3 Encounters:  01/22/23 122/77  07/24/22 128/82  06/30/22 (!) 143/84      She has a long standing history of generalized abdominal and epigastric pain. Was referred to GI in 2021 with plan for: Alk phos isoenzymes EGD Colonoscopy Dicyclomine 20 mg QID (#50 with 3 refills) She was lost to follow up for any procedures and no showed her appts thereafter.     She takes tylenol arthritis for joint pain. Notes effective relief with taking.   Review of Systems  Constitutional:  Negative for fever, malaise/fatigue and weight loss.  HENT: Negative.  Negative for nosebleeds.   Eyes: Negative.  Negative for blurred vision, double vision and photophobia.  Respiratory: Negative.  Negative for cough and shortness of breath.   Cardiovascular: Negative.  Negative for chest pain, palpitations and leg swelling.  Gastrointestinal: Negative.  Negative for heartburn, nausea and vomiting.  Musculoskeletal:  Positive for joint pain. Negative for myalgias.  Neurological: Negative.  Negative for  dizziness, focal weakness, seizures and headaches.  Psychiatric/Behavioral: Negative.  Negative for suicidal ideas.     Past Medical History:  Diagnosis Date   High cholesterol 2013    Hypertension    Obesity (BMI 30-39.9)    Urinary incontinence, mixed     Past Surgical History:  Procedure Laterality Date   BREAST EXCISIONAL BIOPSY Left    BREAST LUMPECTOMY     EYE SURGERY  2012   TUBAL LIGATION  2000    Family History   Problem Relation Age of Onset   Cancer Neg Hx    Heart disease Neg Hx    Diabetes Neg Hx    Hypertension Neg Hx    Breast cancer Neg Hx     Social History Reviewed with no changes to be made today.   Outpatient Medications Prior to Visit  Medication Sig Dispense Refill   amLODipine (NORVASC) 5 MG tablet Take 1 tablet (5 mg total) by mouth daily. DOSE CANGE 90 tablet 0   hydrOXYzine (ATARAX/VISTARIL) 10 MG tablet Take 1 tablet (10 mg total) by mouth 3 (three) times daily as needed. FOR ANXIETY 60 tablet 1   omeprazole (PRILOSEC) 20 MG capsule Take 1 capsule (20 mg total) by mouth daily. 30 capsule 3   traMADol (ULTRAM) 50 MG tablet Take 1-2 tablets (50-100 mg total) by mouth every 8 (eight) hours as needed. 60 tablet 0   No facility-administered medications prior to visit.    Allergies  Allergen Reactions   Lisinopril Cough       Objective:    BP 122/77 (BP Location: Left Arm, Patient Position: Sitting, Cuff Size: Normal)   Pulse 66   Ht 5' (1.524 m)   Wt 164 lb 3.2 oz (74.5 kg)   LMP 11/11/2016 (Exact Date)   SpO2 99%   BMI 32.07 kg/m  Wt Readings from Last 3 Encounters:  01/22/23 164 lb 3.2 oz (74.5 kg)  07/24/22 167 lb 6.4 oz (75.9 kg)  06/30/22 164 lb (74.4 kg)    Physical Exam Vitals and nursing note reviewed.  Constitutional:      Appearance: She is well-developed.  HENT:     Head: Normocephalic and atraumatic.  Cardiovascular:     Rate and Rhythm: Normal rate and regular rhythm.     Heart sounds: Normal heart sounds. No murmur heard.    No friction rub. No gallop.  Pulmonary:     Effort: Pulmonary effort is normal. No tachypnea or respiratory distress.     Breath sounds: Normal breath sounds. No decreased breath sounds, wheezing, rhonchi or rales.  Chest:     Chest wall: No tenderness.  Abdominal:     General: Bowel sounds are normal.     Palpations: Abdomen is soft.  Musculoskeletal:        General: Normal range of motion.     Cervical back:  Normal range of motion.  Skin:    General: Skin is warm and dry.  Neurological:     Mental Status: She is alert and oriented to person, place, and time.     Coordination: Coordination normal.  Psychiatric:        Behavior: Behavior normal. Behavior is cooperative.        Thought Content: Thought content normal.        Judgment: Judgment normal.          Patient has been counseled extensively about nutrition and exercise as well as the importance of adherence with medications and regular follow-up. The  patient was given clear instructions to go to ER or return to medical center if symptoms don't improve, worsen or new problems develop. The patient verbalized understanding.   Follow-up: Return in about 3 months (around 04/24/2023).   Claiborne Rigg, FNP-BC [facility] [place], [place] [phone]   01/22/2023, 2:01 PM

## 2023-01-22 NOTE — Addendum Note (Signed)
Addended by: Arbie Cookey on: 01/22/2023 04:11 PM   Modules accepted: Orders

## 2023-01-22 NOTE — Telephone Encounter (Signed)
Patient forgot to let pcp know she would like a dentist referral she has the [employer].

## 2023-01-24 ENCOUNTER — Other Ambulatory Visit: Payer: Self-pay

## 2023-04-24 ENCOUNTER — Encounter: Payer: Self-pay | Admitting: Nurse Practitioner

## 2023-04-24 ENCOUNTER — Ambulatory Visit: Payer: Self-pay | Attending: Nurse Practitioner | Admitting: Nurse Practitioner

## 2023-04-24 ENCOUNTER — Other Ambulatory Visit: Payer: Self-pay

## 2023-04-24 VITALS — BP 136/82 | HR 79 | Ht 60.0 in | Wt 163.2 lb

## 2023-04-24 DIAGNOSIS — R309 Painful micturition, unspecified: Secondary | ICD-10-CM

## 2023-04-24 DIAGNOSIS — M5432 Sciatica, left side: Secondary | ICD-10-CM

## 2023-04-24 DIAGNOSIS — M25552 Pain in left hip: Secondary | ICD-10-CM

## 2023-04-24 DIAGNOSIS — I10 Essential (primary) hypertension: Secondary | ICD-10-CM

## 2023-04-24 MED ORDER — TRAMADOL HCL 50 MG PO TABS
50.0000 mg | ORAL_TABLET | Freq: Three times a day (TID) | ORAL | 0 refills | Status: AC | PRN
Start: 2023-04-24 — End: 2023-05-24
  Filled 2023-04-24: qty 60, 20d supply, fill #0

## 2023-04-24 MED ORDER — KETOROLAC TROMETHAMINE 30 MG/ML IJ SOLN
30.0000 mg | Freq: Once | INTRAMUSCULAR | Status: AC
Start: 2023-04-24 — End: 2023-04-24
  Administered 2023-04-24: 30 mg via INTRAMUSCULAR

## 2023-04-24 NOTE — Progress Notes (Signed)
Assessment & Plan:  Sheri Mann was seen today for medical management of chronic issues.  Diagnoses and all orders for this visit:  Pain of left hip  Left sided sciatica -     CMP14+EGFR -     Ambulatory referral to Physical Medicine Rehab -     traMADol (ULTRAM) 50 MG tablet; Take 1 tablet (50 mg total) by mouth every 8 (eight) hours as needed. FOR HIP PAIN  Painful urination -     Urinalysis, Complete -     CMP14+EGFR  Primary hypertension -     CMP14+EGFR    Patient has been counseled on age-appropriate routine health concerns for screening and prevention. These are reviewed and up-to-date. Referrals have been placed accordingly. Immunizations are up-to-date or declined.    Subjective:   Chief Complaint  Patient presents with   Medical Management of Chronic Issues    Sheri Mann 56 y.o. female presents to office today for follow up to HTN and with complaints of CHRONIC left sided hip and back pain.   Pt with hx of HTN, HL, obesity, chronic neck pain, chronic LT hip pain on Gabapentin, headaches    Hip pain is unrelated to any injury. She has been taking tylenol with some relief of symptoms. Treatments tried in the past include gabapentin (make her sick and groggy) elavil (unknown side effects), meloxicam, methocarbamol, and naproxen. Aggravating factors: lying on the left side or flexion of leg. SHE HAD A POSITIVE SLR on exam today.  Pain has been ongoing, waxing and waning over the past few years.  Starts in the lower left back and radiates down the left hip and leg. No numbness/tingling in leg.  No incontinence of urine/bowel   HTN Blood pressure is well controlled.  BP Readings from Last 3 Encounters:  04/24/23 136/82  01/22/23 122/77  07/24/22 128/82     Review of Systems  Constitutional:  Negative for fever, malaise/fatigue and weight loss.  HENT: Negative.  Negative for nosebleeds.   Eyes: Negative.  Negative for blurred vision, double vision  and photophobia.  Respiratory: Negative.  Negative for cough and shortness of breath.   Cardiovascular: Negative.  Negative for chest pain, palpitations and leg swelling.  Gastrointestinal: Negative.  Negative for heartburn, nausea and vomiting.  Musculoskeletal:  Positive for back pain and joint pain. Negative for myalgias.  Neurological: Negative.  Negative for dizziness, focal weakness, seizures and headaches.  Psychiatric/Behavioral: Negative.  Negative for suicidal ideas.     Past Medical History:  Diagnosis Date   High cholesterol 2013    Hypertension    Obesity (BMI 30-39.9)    Urinary incontinence, mixed     Past Surgical History:  Procedure Laterality Date   BREAST EXCISIONAL BIOPSY Left    BREAST LUMPECTOMY     EYE SURGERY  2012   TUBAL LIGATION  2000    Family History  Problem Relation Age of Onset   Cancer Neg Hx    Heart disease Neg Hx    Diabetes Neg Hx    Hypertension Neg Hx    Breast cancer Neg Hx     Social History Reviewed with no changes to be made today.   Outpatient Medications Prior to Visit  Medication Sig Dispense Refill   amLODipine (NORVASC) 5 MG tablet Take 1 tablet (5 mg total) by mouth daily. 90 tablet 1   No facility-administered medications prior to visit.    Allergies  Allergen Reactions   Lisinopril Cough  Objective:    BP 136/82 (BP Location: Left Arm, Patient Position: Sitting, Cuff Size: Normal)   Pulse 79   Ht 5' (1.524 m)   Wt 163 lb 3.2 oz (74 kg)   LMP 11/11/2016 (Exact Date)   SpO2 97%   BMI 31.87 kg/m  Wt Readings from Last 3 Encounters:  04/24/23 163 lb 3.2 oz (74 kg)  01/22/23 164 lb 3.2 oz (74.5 kg)  07/24/22 167 lb 6.4 oz (75.9 kg)    Physical Exam Vitals and nursing note reviewed.  Constitutional:      Appearance: She is well-developed.  HENT:     Head: Normocephalic and atraumatic.  Cardiovascular:     Rate and Rhythm: Normal rate and regular rhythm.     Heart sounds: Normal heart sounds. No  murmur heard.    No friction rub. No gallop.  Pulmonary:     Effort: Pulmonary effort is normal. No tachypnea or respiratory distress.     Breath sounds: Normal breath sounds. No decreased breath sounds, wheezing, rhonchi or rales.  Chest:     Chest wall: No tenderness.  Abdominal:     General: Bowel sounds are normal.     Palpations: Abdomen is soft.  Musculoskeletal:     Cervical back: Normal range of motion.     Lumbar back: Positive left straight leg raise test.     Left hip: Decreased strength.  Skin:    General: Skin is warm and dry.  Neurological:     Mental Status: She is alert and oriented to person, place, and time.     Coordination: Coordination normal.  Psychiatric:        Behavior: Behavior normal. Behavior is cooperative.        Thought Content: Thought content normal.        Judgment: Judgment normal.          Patient has been counseled extensively about nutrition and exercise as well as the importance of adherence with medications and regular follow-up. The patient was given clear instructions to go to ER or return to medical center if symptoms don't improve, worsen or new problems develop. The patient verbalized understanding.   Follow-up: Return if symptoms worsen or fail to improve.   Claiborne Rigg, FNP-BC [facility] [place], [place] [phone]   04/24/2023, 2:56 PM

## 2023-04-24 NOTE — Progress Notes (Signed)
Left lower abdominal pain

## 2023-04-25 LAB — CMP14+EGFR
ALT: 16 [IU]/L (ref 0–32)
AST: 19 [IU]/L (ref 0–40)
Albumin: 4.6 g/dL (ref 3.8–4.9)
Alkaline Phosphatase: 155 [IU]/L — ABNORMAL HIGH (ref 44–121)
BUN/Creatinine Ratio: 25 — ABNORMAL HIGH (ref 9–23)
BUN: 15 mg/dL (ref 6–24)
Bilirubin Total: <0.2 mg/dL (ref 0.0–1.2)
CO2: 25 mmol/L (ref 20–29)
Calcium: 10.1 mg/dL (ref 8.7–10.2)
Chloride: 107 mmol/L — ABNORMAL HIGH (ref 96–106)
Creatinine, Ser: 0.59 mg/dL (ref 0.57–1.00)
Globulin, Total: 2.6 g/dL (ref 1.5–4.5)
Glucose: 94 mg/dL (ref 70–99)
Potassium: 4.4 mmol/L (ref 3.5–5.2)
Sodium: 143 mmol/L (ref 134–144)
Total Protein: 7.2 g/dL (ref 6.0–8.5)
eGFR: 106 mL/min/{1.73_m2} (ref >59)

## 2023-04-25 LAB — URINALYSIS, COMPLETE
Bilirubin, UA: NEGATIVE
Glucose, UA: NEGATIVE
Ketones, UA: NEGATIVE
Nitrite, UA: NEGATIVE
Protein,UA: NEGATIVE
RBC, UA: NEGATIVE
Specific Gravity, UA: 1.027 (ref 1.005–1.030)
Urobilinogen, Ur: 0.2 mg/dL (ref 0.2–1.0)
pH, UA: 5.5 (ref 5.0–7.5)

## 2023-04-25 LAB — MICROSCOPIC EXAMINATION
Bacteria, UA: NONE SEEN
Casts: NONE SEEN /[LPF]
Epithelial Cells (non renal): NONE SEEN /[HPF] (ref 0–10)

## 2023-04-29 ENCOUNTER — Other Ambulatory Visit: Payer: Self-pay | Admitting: Nurse Practitioner

## 2023-04-29 DIAGNOSIS — N3000 Acute cystitis without hematuria: Secondary | ICD-10-CM

## 2023-04-29 MED ORDER — NITROFURANTOIN MONOHYD MACRO 100 MG PO CAPS
100.0000 mg | ORAL_CAPSULE | Freq: Two times a day (BID) | ORAL | 0 refills | Status: AC
  Filled 2023-04-29: qty 10, 5d supply, fill #0

## 2023-04-30 ENCOUNTER — Other Ambulatory Visit: Payer: Self-pay

## 2023-05-02 ENCOUNTER — Other Ambulatory Visit: Payer: Self-pay

## 2023-05-02 ENCOUNTER — Encounter: Payer: Self-pay | Attending: Physical Medicine & Rehabilitation | Admitting: Physical Medicine & Rehabilitation

## 2023-05-02 ENCOUNTER — Emergency Department (HOSPITAL_COMMUNITY)
Admission: EM | Admit: 2023-05-02 | Discharge: 2023-05-02 | Discharge status: Against Medical Advice | Payer: Self-pay | Attending: Physical Medicine & Rehabilitation | Admitting: Physical Medicine & Rehabilitation

## 2023-05-02 ENCOUNTER — Emergency Department (HOSPITAL_COMMUNITY)
Admit: 2023-05-02 | Discharge: 2023-05-02 | Disposition: A | Discharge status: Home / Self Care | Payer: Self-pay | Attending: Physical Medicine & Rehabilitation | Admitting: Physical Medicine & Rehabilitation

## 2023-05-02 ENCOUNTER — Encounter: Payer: Self-pay | Admitting: Physical Medicine & Rehabilitation

## 2023-05-02 VITALS — BP 136/87 | HR 73 | Ht 60.0 in | Wt 165.0 lb

## 2023-05-02 DIAGNOSIS — M47816 Spondylosis without myelopathy or radiculopathy, lumbar region: Secondary | ICD-10-CM

## 2023-05-02 DIAGNOSIS — M7062 Trochanteric bursitis, left hip: Secondary | ICD-10-CM | POA: Insufficient documentation

## 2023-05-02 DIAGNOSIS — Z5321 Procedure and treatment not carried out due to patient leaving prior to being seen by health care provider: Secondary | ICD-10-CM | POA: Insufficient documentation

## 2023-05-02 DIAGNOSIS — M549 Dorsalgia, unspecified: Secondary | ICD-10-CM | POA: Insufficient documentation

## 2023-05-02 MED ORDER — DICLOFENAC SODIUM 50 MG PO TBEC
50.0000 mg | DELAYED_RELEASE_TABLET | Freq: Two times a day (BID) | ORAL | 3 refills | Status: DC
  Filled 2023-05-02: qty 60, 30d supply, fill #0

## 2023-05-02 NOTE — ED Notes (Signed)
Called pt. x3 with no answer.  

## 2023-05-02 NOTE — ED Notes (Signed)
No answer from pt in waiting room for triage. °

## 2023-05-02 NOTE — ED Notes (Signed)
Called for pt in lobby, no answer.

## 2023-05-02 NOTE — Progress Notes (Signed)
Subjective:    Patient ID: Sheri Mann, female    DOB: 1966-02-13, 57 y.o.   MRN: [id number]  HPI  Sheri Mann is a 57 year old female who is here today with complaints of chronic low back and left-sided hip pain over the past few years. She doesn't recall any inciting event or accident.  Pain seems to start in her left lower back/SI area with radiation into the left hip and lower leg.  She denies any tingling or numbness in the leg.  The pain is made worse with sitting and lying down, lying being he worst. The pain occurs daily with the most severe sx perhaps once per week.  It has been managed by her primary care MD. Apparently she has tried gabapentin but it makes her feel groggy and nausea. She was given a recent rx for tramadol which also makes her sleepy.  She is also been on amitriptyline meloxicam methocarbamol and naproxen. She is currently taking tylenol once per day which controls the pain. Her husband also massages her left hip and back. I found a left hip x-ray from March of this year which showed no apparent disease. She has never had PT.       Pain Inventory Average Pain 9 Pain Right Now 8 My pain is intermittent and burning  In the last 24 hours, has pain interfered with the following? General activity 8 Relation with others 7 Enjoyment of life 8 What TIME of day is your pain at its worst? night Sleep (in general) Fair  Pain is worse with: walking and sitting Pain improves with: medication Relief from Meds: 7  walk without assistance how many minutes can you walk? 60 ability to climb steps?  yes do you drive?  no  what is your job? Not working  bladder control problems dizziness  Any changes since last visit?  no  Any changes since last visit?  no    Family History  Problem Relation Age of Onset   Cancer Neg Hx    Heart disease Neg Hx    Diabetes Neg Hx    Hypertension Neg Hx    Breast cancer Neg Hx    Social History    Socioeconomic History   Marital status: Married    Spouse name: Not on file   Number of children: 5   Years of education: 2    Highest education level: 2nd grade  Occupational History   Occupation: Unemployed   Tobacco Use   Smoking status: Never   Smokeless tobacco: Never  Vaping Use   Vaping status: Never Used  Substance and Sexual Activity   Alcohol use: No   Drug use: No   Sexual activity: Yes    Birth control/protection: Surgical  Other Topics Concern   Not on file  Social History Narrative   Lives with husband and brother in [place].    5 children grown and married.    Cannot read or write in spanish.    Husband can read and write in spanish or english.    Social Determinants of Health   Financial Resource Strain: Patient Declined (04/25/2023)   Overall Financial Resource Strain (CARDIA)    Difficulty of Paying Living Expenses: Patient declined  Food Insecurity: Patient Declined (04/25/2023)   Hunger Vital Sign    Worried About Running Out of Food in the Last Year: Patient declined    Ran Out of Food in the Last Year: Patient declined  Transportation Needs: Patient Declined (04/25/2023)  PRAPARE - Administrator, Civil Service (Medical): Patient declined    Lack of Transportation (Non-Medical): Patient declined  Physical Activity: Patient Declined (04/25/2023)   Exercise Vital Sign    Days of Exercise per Week: Patient declined    Minutes of Exercise per Session: Patient declined  Stress: Patient Declined (04/25/2023)   [employer] of Occupational Health - Occupational Stress Questionnaire    Feeling of Stress : Patient declined  Social Connections: Patient Declined (04/25/2023)   Social Connection and Isolation Panel [NHANES]    Frequency of Communication with Friends and Family: Patient declined    Frequency of Social Gatherings with Friends and Family: Patient declined    Attends Religious Services: Patient declined    Database administrator or  Organizations: Patient declined    Attends Engineer, structural: Patient declined    Marital Status: Patient declined   Past Surgical History:  Procedure Laterality Date   BREAST EXCISIONAL BIOPSY Left    BREAST LUMPECTOMY     EYE SURGERY  2012   TUBAL LIGATION  2000   Past Medical History:  Diagnosis Date   High cholesterol 2013    Hypertension    Obesity (BMI 30-39.9)    Urinary incontinence, mixed    Ht 5' (1.524 m)   Wt 165 lb (74.8 kg)   LMP 11/11/2016 (Exact Date)   BMI 32.22 kg/m   Opioid Risk Score:   Fall Risk Score:  `1  Depression screen [facility] 2/9     04/25/2023    9:18 AM 01/22/2023    2:04 PM 07/24/2022    3:43 PM 06/30/2022   10:49 AM 11/21/2021    2:10 PM 07/25/2021   11:16 AM 06/29/2021    2:37 PM  Depression screen PHQ 2/9  Decreased Interest 0 0 0 0 0 0 0  Down, Depressed, Hopeless 0 0 0 0 0 0 0  PHQ - 2 Score 0 0 0 0 0 0 0  Altered sleeping   0 0 0 0 0  Tired, decreased energy   0 0 0 0 0  Change in appetite   0 0 2 0 0  Feeling bad or failure about yourself    0 0 0 0 0  Trouble concentrating   0 0 0 0 0  Moving slowly or fidgety/restless   0 0 0 0 0  Suicidal thoughts   0 0 0 0 0  PHQ-9 Score   0 0 2 0 0  Difficult doing work/chores      Not difficult at all Not difficult at all    Review of Systems  Musculoskeletal:  Positive for neck pain.       Left hip, buttock, leg pain   All other systems reviewed and are negative.      Objective:   Physical Exam Gen: no distress, normal appearing, obese HEENT: oral mucosa pink and moist, NCAT Cardio: Reg rate Chest: normal effort, normal rate of breathing Abd: soft, non-distended Ext: no edema Psych: pleasant, normal affect Skin: intact Neuro: Alert and oriented x 3. Normal insight and awareness. Intact Memory. Normal language and speech. Cranial nerve exam unremarkable. MMT: Upper extremity motor functions 5 out of 5.  Left lower extremity is inhibited somewhat by pain but is grossly 3-4  out of 5 proximal distal.  Pain more inhibited proximally.  Right lower extremity is 5 out of 5.  No gross sensory findings.  Normal reflexes..   Musculoskeletal: On examination of  posture is fair in sitting and standing.  She is able to stand at a neutral position once rising from the exam table.  She was able to bend almost to 90 degrees but had pain in her left low back as well as her left lateral hip area.  Extension cause some mild discomfort.  She seem to have more discomfort with lateral bending and lateral rotation to the left.  She was tender to palpation over the lower lumbar paraspinals and lumbar segments as well as the left PSIS area and left greater trochanter area.  Cross leg maneuver and Patrick's test both cause pain in the outer left hip and greater trochanter area.  Seated slump test and straight leg testing only cause pain in her left lateral hip and to a lesser extent her low back.  When ambulating she tended to favor her left leg with gait.      Assessment & Plan:  Chronic left -sided low back pain Left sided trochanteric bursitis No signs of sciatica   Plan:  Refer to PT for low back rom, modalities, HEP Trial of diclofenac 50mg  bid with food May take tylenol 650mg  BID Ice to left hip 2-3 times daily Greater trochanter exercises were provided and described Xrays of low back to assess lumbar spine.   Thirty minutes + of face to face patient care time were spent during this visit. All questions were encouraged and answered. Follow up with me in 2 months.

## 2023-05-02 NOTE — Patient Instructions (Signed)
ALWAYS FEEL FREE TO CALL OUR OFFICE WITH ANY PROBLEMS OR QUESTIONS [phone])  **PLEASE NOTE** ALL MEDICATION REFILL REQUESTS (INCLUDING CONTROLLED SUBSTANCES) NEED TO BE MADE AT LEAST 7 DAYS PRIOR TO REFILL BEING DUE. ANY REFILL REQUESTS INSIDE THAT TIME FRAME MAY RESULT IN DELAYS IN RECEIVING YOUR PRESCRIPTION.    PLEASE PLACE ICE ON YOUR LEFT HIP 2-3 X DAILY

## 2023-05-09 ENCOUNTER — Other Ambulatory Visit: Payer: Self-pay | Admitting: Nurse Practitioner

## 2023-05-09 ENCOUNTER — Other Ambulatory Visit: Payer: Self-pay

## 2023-05-09 DIAGNOSIS — N390 Urinary tract infection, site not specified: Secondary | ICD-10-CM

## 2023-05-09 MED ORDER — CEPHALEXIN 500 MG PO CAPS
500.0000 mg | ORAL_CAPSULE | Freq: Two times a day (BID) | ORAL | 0 refills | Status: AC
  Filled 2023-05-09: qty 14, 7d supply, fill #0

## 2023-07-18 ENCOUNTER — Encounter: Payer: Self-pay | Admitting: Physical Medicine & Rehabilitation

## 2023-08-15 ENCOUNTER — Encounter: Payer: Self-pay | Admitting: Physical Medicine & Rehabilitation

## 2023-08-29 ENCOUNTER — Ambulatory Visit: Payer: Self-pay | Admitting: Nurse Practitioner

## 2023-08-29 ENCOUNTER — Ambulatory Visit: Payer: Self-pay | Admitting: Family Medicine

## 2023-08-29 NOTE — Telephone Encounter (Signed)
 Chief Complaint: breast pain Symptoms: L breast pain and back pain, SOB & CP on exertion, headache Frequency: Since Friday 3/7 Pertinent Negatives: Patient denies fever, redness, swelling, rash, N/V Disposition: [] ED /[] Urgent Care (no appt availability in office) / [x] Appointment(In office/virtual)/ []  [place] [facility]/ [] Home Care/ [] Refused Recommended Disposition /[] [place] [facility]/ []  Follow-up with PCP Additional Notes: Pt reports L breast pain that started last Friday 3/7. Pt states she had surgery on that breast 20 years ago. Pt states she has had pain in this breast before but it usually resolves within 3 days. Pt states the breast hurts to the touch and is painful with showering and when she puts a shirt on. Pt denies swelling, redness, rash, discharge, fever. Pt also reports back pain on the L side as well and a headache. Pt endorses CP and SOB only on exertion - with walking, talking too much, or singing at church. Pt states she is unable to lift heavy items d/t the pain. Pt denies SOB and CP at the time of the call. Per protocol for SOB on exertion and breast pain, RN advised pt she should be seen within 4 hours. No availability at her PCP office within that timeframe. RN scheduled pt for an acute visit at 1315 at [facility] as it is most close to the pt's home. Pt agreeable to that plan. RN advised pt if she becomes SOB at rest or her CP returns, she needs to call 911, and if she develops any other worsening she can give [place] a call.   Copied from CRM [id number]. Topic: Clinical - Red Word Triage >> Aug 29, 2023  9:28 AM [name] wrote: Red Word that prompted transfer to Nurse Triage: Pain left breast and side/back - can't hardy move hand and arm, feeling tired when talking - 1 week . Level 9 Reason for Disposition  [1] MILD difficulty breathing (e.g., minimal/no SOB at rest, SOB with walking, pulse <100) AND [2] NEW-onset or WORSE than normal  [1] Breast looks infected (spreading  redness, feels hot or painful to touch) AND [2] no fever  Answer Assessment - Initial Assessment Questions 1. SYMPTOM: "What's the main symptom you're concerned about?"  (e.g., lump, pain, rash, nipple discharge)     Pain - yesterday pain was severe, pt was not able to sleep, pt states she took a pain pill and in the afternoon pain got stronger. Pt states breast hurts to the touch.  2. LOCATION: "Where is the pain located?"     L breast and side/back 3. ONSET: "When did pain start?"     Friday - pt states she has had pain like this before but it goes away after 3 days 4. 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 CAUSE: "What do you think is causing this symptom?"     Not sure  6. OTHER SYMPTOMS: "Do you have any other symptoms?" (e.g., fever, breast pain, redness or rash, nipple discharge)     Pt states she had surgery on her L breast 20 yrs ago. Pt endorses pain started in the middle of her breast, pain with showering and changing clothes, pain is worse at night. Pt denies redness, rash, discharge from the nipple. "My breast is  kind of heavy and my back is burning." Pt endorses pain in the L side of her back with pain to her head as well. Pt rates her pain 9/10. Pt endorses CP "only when I walk or talk too much, I go to a church and sing there", states she cannot lift heavy items. Denies CP or SOB at this current moment, only endorses back pain and breast pain. Denies N/V. Denies palpitations right now but endorses intermittent "stinging sensation." Endorses dizziness/lightheadedness with her headache.  Answer Assessment - Initial Assessment Questions 1. RESPIRATORY STATUS: "Describe your breathing?" (e.g., wheezing, shortness of breath, unable to speak, severe coughing)      SOB  with talking, walking, singing  2. ONSET: "When did this breathing problem begin?"      "It started on Friday" 3. PATTERN "Does the difficult breathing come and go, or has it been constant since it started?"      Comes and goes 4. SEVERITY: "How bad is your breathing?" (e.g., mild, moderate, severe)    - MILD: No SOB at rest, mild SOB with walking, speaks normally in sentences, can lie down, no retractions, pulse < 100.    - MODERATE: SOB at rest, SOB with minimal exertion and prefers to sit, cannot lie down flat, speaks in phrases, mild retractions, audible wheezing, pulse 100-120.    - SEVERE: Very SOB at rest, speaks in single words, struggling to breathe, sitting hunched forward, retractions, pulse > 120      Mild 5. RECURRENT SYMPTOM: "Have you had difficulty breathing before?" If Yes, ask: "When was the last time?" and "What happened that time?"      Yes 6. CARDIAC HISTORY: "Do you have any history of heart disease?" (e.g., heart attack, angina, bypass surgery, angioplasty)      No 7. LUNG HISTORY: "Do you have any history of lung disease?"  (e.g., pulmonary embolus, asthma, emphysema)     No 8. CAUSE: "What do you think is causing the breathing problem?"      Breast/back pain 9. OTHER SYMPTOMS: "Do you have any other symptoms? (e.g., dizziness, runny nose, cough, chest pain, fever)     Breast pain  Protocols used: Breast Symptoms-A-AH, Breathing Difficulty-A-AH

## 2023-08-29 NOTE — Telephone Encounter (Signed)
 FYI

## 2023-08-30 ENCOUNTER — Encounter: Payer: Self-pay | Admitting: Family Medicine

## 2023-08-30 ENCOUNTER — Other Ambulatory Visit: Payer: Self-pay

## 2023-08-30 ENCOUNTER — Ambulatory Visit: Payer: Self-pay | Admitting: Family Medicine

## 2023-08-30 VITALS — BP 122/83 | HR 67 | Temp 98.4°F | Resp 18 | Ht 59.5 in | Wt 166.4 lb

## 2023-08-30 DIAGNOSIS — N644 Mastodynia: Secondary | ICD-10-CM

## 2023-08-30 NOTE — Progress Notes (Signed)
 Established Patient Office Visit  Subjective    Patient ID: Sheri Mann, female    DOB: 1965-12-21  Age: 58 y.o. MRN: [id number]  CC:  Chief Complaint  Patient presents with   Breast Pain    Sob with exertion    HPI Sheri Mann presents for left sided breast pain for about 1 week. Patient denies an known trauma or injury. Patient denies nipple discharge or palpable mass  Outpatient Encounter Medications as of 08/30/2023  Medication Sig   amLODipine (NORVASC) 5 MG tablet Take 1 tablet (5 mg total) by mouth daily.   diclofenac (VOLTAREN) 50 MG EC tablet Take 1 tablet (50 mg total) by mouth 2 (two) times daily. (Patient not taking: Reported on 08/30/2023)   No facility-administered encounter medications on file as of 08/30/2023.    Past Medical History:  Diagnosis Date   High cholesterol 2013    Hypertension    Obesity (BMI 30-39.9)    Urinary incontinence, mixed     Past Surgical History:  Procedure Laterality Date   BREAST EXCISIONAL BIOPSY Left    BREAST LUMPECTOMY     EYE SURGERY  2012   TUBAL LIGATION  2000    Family History  Problem Relation Age of Onset   Cancer Neg Hx    Heart disease Neg Hx    Diabetes Neg Hx    Hypertension Neg Hx    Breast cancer Neg Hx     Social History   Socioeconomic History   Marital status: Married    Spouse name: Not on file   Number of children: 5   Years of education: 2    Highest education level: 2nd grade  Occupational History   Occupation: Unemployed   Tobacco Use   Smoking status: Never   Smokeless tobacco: Never  Vaping Use   Vaping status: Never Used  Substance and Sexual Activity   Alcohol use: No   Drug use: No   Sexual activity: Yes    Birth control/protection: Surgical  Other Topics Concern   Not on file  Social History Narrative   Lives with husband and brother in [place].    5 children grown and married.    Cannot read or write in spanish.    Husband can read and write in  spanish or english.    Social Drivers of Corporate investment banker Strain: Low Risk  (08/30/2023)   Overall Financial Resource Strain (CARDIA)    Difficulty of Paying Living Expenses: Not hard at all  Food Insecurity: Unknown (08/30/2023)   Hunger Vital Sign    Worried About Running Out of Food in the Last Year: Never true    Ran Out of Food in the Last Year: Patient declined  Transportation Needs: Patient Declined (04/25/2023)   PRAPARE - Administrator, Civil Service (Medical): Patient declined    Lack of Transportation (Non-Medical): Patient declined  Physical Activity: Insufficiently Active (08/30/2023)   Exercise Vital Sign    Days of Exercise per Week: 1 day    Minutes of Exercise per Session: 30 min  Stress: No Stress Concern Present (08/30/2023)   [employer] of Occupational Health - Occupational Stress Questionnaire    Feeling of Stress : Not at all  Social Connections: Moderately Integrated (08/30/2023)   Social Connection and Isolation Panel [NHANES]    Frequency of Communication with Friends and Family: Patient unable to answer    Frequency of Social Gatherings with Friends and Family: More  than three times a week    Attends Religious Services: More than 4 times per year    Active Member of Clubs or Organizations: No    Attends Banker Meetings: Never    Marital Status: Married  Catering manager Violence: Not At Risk (08/30/2023)   Humiliation, Afraid, Rape, and Kick questionnaire    Fear of Current or Ex-Partner: No    Emotionally Abused: No    Physically Abused: No    Sexually Abused: No    Review of Systems  All other systems reviewed and are negative.       Objective    BP 122/83   Pulse 67   Temp 98.4 F (36.9 C) (Oral)   Resp 18   Ht 4' 11.5" (1.511 m)   Wt 166 lb 6.4 oz (75.5 kg)   LMP 11/11/2016 (Exact Date)   SpO2 96%   BMI 33.05 kg/m   Physical Exam Vitals and nursing note reviewed.  Constitutional:       General: She is not in acute distress. Cardiovascular:     Rate and Rhythm: Normal rate and regular rhythm.  Pulmonary:     Effort: Pulmonary effort is normal.     Breath sounds: Normal breath sounds.  Chest:  Breasts:    Right: Normal.     Left: Tenderness present. No mass, nipple discharge or skin change.  Neurological:     General: No focal deficit present.     Mental Status: She is alert and oriented to person, place, and time.         Assessment & Plan:   Breast pain, left -     MM 3D DIAGNOSTIC MAMMOGRAM BILATERAL BREAST; Future -     [place] LIMITED ULTRASOUND INCLUDING AXILLA LEFT BREAST ; Future     No follow-ups on file.   Tommie Raymond, MD

## 2023-09-04 ENCOUNTER — Encounter: Payer: Self-pay | Admitting: Family Medicine

## 2023-09-11 ENCOUNTER — Emergency Department (HOSPITAL_COMMUNITY)
Admission: EM | Admit: 2023-09-11 | Discharge: 2023-09-11 | Disposition: A | Discharge status: Home / Self Care | Payer: Self-pay | Attending: Emergency Medicine | Admitting: Emergency Medicine

## 2023-09-11 ENCOUNTER — Emergency Department (HOSPITAL_COMMUNITY): Payer: Self-pay

## 2023-09-11 ENCOUNTER — Other Ambulatory Visit: Payer: Self-pay

## 2023-09-11 DIAGNOSIS — R0789 Other chest pain: Secondary | ICD-10-CM | POA: Insufficient documentation

## 2023-09-11 DIAGNOSIS — N644 Mastodynia: Secondary | ICD-10-CM | POA: Insufficient documentation

## 2023-09-11 DIAGNOSIS — I10 Essential (primary) hypertension: Secondary | ICD-10-CM | POA: Insufficient documentation

## 2023-09-11 LAB — CBC
HCT: 37.4 % (ref 36.0–46.0)
Hemoglobin: 12.2 g/dL (ref 12.0–15.0)
MCH: 29.2 pg (ref 26.0–34.0)
MCHC: 32.6 g/dL (ref 30.0–36.0)
MCV: 89.5 fL (ref 80.0–100.0)
Platelets: 173 10*3/uL (ref 150–400)
RBC: 4.18 MIL/uL (ref 3.87–5.11)
RDW: 12.4 % (ref 11.5–15.5)
WBC: 4.4 10*3/uL (ref 4.0–10.5)
nRBC: 0 % (ref 0.0–0.2)

## 2023-09-11 LAB — BASIC METABOLIC PANEL
Anion gap: 8 (ref 5–15)
BUN: 10 mg/dL (ref 6–20)
CO2: 23 mmol/L (ref 22–32)
Calcium: 9.4 mg/dL (ref 8.9–10.3)
Chloride: 108 mmol/L (ref 98–111)
Creatinine, Ser: 0.63 mg/dL (ref 0.44–1.00)
GFR, Estimated: >60 mL/min (ref >60)
Glucose, Bld: 94 mg/dL (ref 70–99)
Potassium: 3.8 mmol/L (ref 3.5–5.1)
Sodium: 139 mmol/L (ref 135–145)

## 2023-09-11 LAB — TROPONIN I (HIGH SENSITIVITY): Troponin I (High Sensitivity): <2 ng/L (ref <18)

## 2023-09-11 MED ORDER — HYDROCODONE-ACETAMINOPHEN 5-325 MG PO TABS
1.0000 | ORAL_TABLET | Freq: Once | ORAL | Status: AC
  Administered 2023-09-11: 1 via ORAL
  Filled 2023-09-11: qty 1

## 2023-09-11 MED ORDER — HYDROCODONE-ACETAMINOPHEN 5-325 MG PO TABS
1.0000 | ORAL_TABLET | Freq: Four times a day (QID) | ORAL | 0 refills | Status: DC | PRN
  Filled 2023-09-11: qty 15, 4d supply, fill #0

## 2023-09-11 MED ORDER — KETOROLAC TROMETHAMINE 15 MG/ML IJ SOLN
15.0000 mg | Freq: Once | INTRAMUSCULAR | Status: AC
  Administered 2023-09-11: 15 mg via INTRAVENOUS
  Filled 2023-09-11: qty 1

## 2023-09-11 MED ORDER — KETOROLAC TROMETHAMINE 15 MG/ML IJ SOLN: 15.0000 mg | Freq: Once | INTRAMUSCULAR | Status: DC

## 2023-09-11 NOTE — ED Provider Notes (Signed)
 [place] EMERGENCY DEPARTMENT AT [facility] Provider Note   CSN: [id number] Arrival date & time: 09/11/23  3474     History  Chief Complaint  Patient presents with   Chest Pain   Back Pain    Sheri Mann is a 58 y.o. female with medical history significant for hypertension, high cholesterol.  Patient presents to ED for evaluation of left-sided breast pain/chest pain.  Reports that she has had persistent left-sided breast pain for the last 4 weeks.  She was seen by her PCP for this 2 weeks ago, scheduled for mammogram as well as ultrasound imaging of her left breast in April.  She presents today due to pain.  She states that the pain last night was so exquisite that she was unable to sleep.  She denies fevers, drainage from her breast, redness or skin change to her breast.  She is not actively breast-feeding, she is 57.  She denies any shortness of breath, nausea, vomiting, abdominal pain, lightheadedness, dizziness, weakness, leg swelling.  She denies a history of DVT or PE, leg swelling, hemoptysis, cough, recent surgery or travel.  Denies blood in her medication.   Chest Pain Associated symptoms: back pain   Associated symptoms: no fever   Back Pain Associated symptoms: no fever        Home Medications Prior to Admission medications   Medication Sig Start Date End Date Taking? Authorizing Provider  HYDROcodone-acetaminophen (NORCO/VICODIN) 5-325 MG tablet Take 1 tablet by mouth every 6 (six) hours as needed. 09/11/23  Yes Al Decant, PA-C  amLODipine (NORVASC) 5 MG tablet Take 1 tablet (5 mg total) by mouth daily. 01/22/23   Claiborne Rigg, NP  diclofenac (VOLTAREN) 50 MG EC tablet Take 1 tablet (50 mg total) by mouth 2 (two) times daily. Patient not taking: Reported on 08/30/2023 05/02/23   Ranelle Oyster, MD      Allergies    Lisinopril    Review of Systems   Review of Systems  Constitutional:  Negative for fever.  Cardiovascular:         Breast pain  Musculoskeletal:  Positive for back pain.  All other systems reviewed and are negative.   Physical Exam Updated Vital Signs BP (!) 140/93   Pulse 73   Temp 98.4 F (36.9 C)   Resp 15   LMP 11/11/2016 (Exact Date)   SpO2 100%  Physical Exam Vitals and nursing note reviewed.  Constitutional:      General: She is not in acute distress.    Appearance: She is well-developed.  HENT:     Head: Normocephalic and atraumatic.  Eyes:     Conjunctiva/sclera: Conjunctivae normal.  Cardiovascular:     Rate and Rhythm: Normal rate and regular rhythm.     Heart sounds: No murmur heard. Pulmonary:     Effort: Pulmonary effort is normal. No respiratory distress.     Breath sounds: Normal breath sounds.  Chest:     Chest wall: Tenderness present.       Comments: Tenderness to palpation of the above circumscribed areas.  No overlying skin change, no masses, no fluctuance.  No drainage from nipples. Abdominal:     Palpations: Abdomen is soft.     Tenderness: There is no abdominal tenderness.  Musculoskeletal:        General: No swelling.     Cervical back: Neck supple.  Skin:    General: Skin is warm and dry.     Capillary  Refill: Capillary refill takes less than 2 seconds.  Neurological:     Mental Status: She is alert.  Psychiatric:        Mood and Affect: Mood normal.     ED Results / Procedures / Treatments   Labs (all labs ordered are listed, but only abnormal results are displayed) Labs Reviewed  CBC  BASIC METABOLIC PANEL  TROPONIN I (HIGH SENSITIVITY)    EKG None  Radiology DG Chest Portable 1 View Result Date: 09/11/2023 CLINICAL DATA:  Chest pain. EXAM: PORTABLE CHEST 1 VIEW COMPARISON:  08/20/2021. FINDINGS: Bilateral lung fields are clear. Bilateral costophrenic angles are clear. Stable cardio-mediastinal silhouette. No acute osseous abnormalities. The soft tissues are within normal limits. IMPRESSION: No active disease. Electronically Signed    By: Jules Schick M.D.   On: 09/11/2023 12:17    Procedures Procedures   Medications Ordered in ED Medications  HYDROcodone-acetaminophen (NORCO/VICODIN) 5-325 MG per tablet 1 tablet (1 tablet Oral Given 09/11/23 1013)  ketorolac (TORADOL) 15 MG/ML injection 15 mg (15 mg Intravenous Given 09/11/23 1010)    ED Course/ Medical Decision Making/ A&P    Medical Decision Making Amount and/or Complexity of Data Reviewed Labs: ordered. Radiology: ordered.  Risk Prescription drug management.   58 year old female presents for evaluation.  Please see HPI for further details.  On examination patient is afebrile and nontachycardic.  Her lung sounds are clear bilaterally, she is not hypoxic.  Abdomen is soft and compressible.  Neurological examinations at baseline.  Patient does have tenderness to her left breast.  No overlying skin change, palpable masses, fluctuance or induration.  No drainage from the nipple.  Patient was seen for the same complaint by her PCP.  Scheduled to have outpatient ultrasound, mammogram in April.  I explained to the patient that we do not have this kind of imaging services available here in the ED.  I advised her that I would like to evaluate her for any kind of cardiac cause of her pain and she agreed to this.  Patient troponin less than 2, no delta will be collected as she reports pain is been ongoing for 4 weeks.  EKG is nonischemic.  Chest x-ray unremarkable.  CBC, BMP unremarkable.  Patient was given Toradol, hydrocodone.  Reports that her pain has decreased.  Will send patient home with very small amount of pain medication and advised her to continue along with follow-up imaging in April.  Have encouraged her to follow-up with PCP.  She voiced understanding.  Translator utilized.  Stable to discharge.   Final Clinical Impression(s) / ED Diagnoses Final diagnoses:  Breast pain    Rx / DC Orders ED Discharge Orders          Ordered     HYDROcodone-acetaminophen (NORCO/VICODIN) 5-325 MG tablet  Every 6 hours PRN        09/11/23 1222              Clent Ridges 09/11/23 1223    Lorre Nick, MD 09/12/23 1019

## 2023-09-11 NOTE — Discharge Instructions (Signed)
 It was a pleasure taking part in your care.  As discussed, your workup is reassuring here.  No evidence of stress on your heart.  Please follow-up for breast imaging in April as previously scheduled.  Please follow-up with PCP.  Please take pain medication every 6 hours as needed for pain.  Please do not drive or operate heavy machinery while taking pain medication.  Return to the ED with any new or worsening symptoms.  Read attached guide concerning breast tenderness.  Fue un Building surveyor atencin. Como ya comentamos, su evaluacin es tranquilizadora. No hay evidencia de estrs cardaco. Por favor, acuda a una consulta de imagen mamaria en abril, como se program previamente. Por favor, acuda a su mdico de cabecera para realizarle un seguimiento. Tome analgsicos cada 6 horas segn sea necesario para Chief Technology Officer. No conduzca ni opere maquinaria pesada mientras est tomando analgsicos. Regrese a urgencias ante cualquier sntoma nuevo o que empeore. Lea la gua adjunta sobre la sensibilidad [place].

## 2023-09-11 NOTE — ED Notes (Signed)
 Got patient on the monitor did EKG shown to Dr Freida Busman patient is resting with call bell in reach and family at bedside

## 2023-09-11 NOTE — ED Triage Notes (Signed)
 Patient with L breast pain x 4 weeks. Denies drainage or redness.

## 2023-10-11 ENCOUNTER — Ambulatory Visit
Admission: RE | Admit: 2023-10-11 | Discharge: 2023-10-11 | Disposition: A | Discharge status: Home / Self Care | Payer: Self-pay | Source: Ambulatory Visit | Attending: Obstetrics and Gynecology | Admitting: Obstetrics and Gynecology

## 2023-10-11 ENCOUNTER — Ambulatory Visit: Payer: Self-pay | Admitting: *Deleted

## 2023-10-11 VITALS — BP 130/82 | Wt 169.2 lb

## 2023-10-11 DIAGNOSIS — N644 Mastodynia: Secondary | ICD-10-CM

## 2023-10-11 DIAGNOSIS — Z1239 Encounter for other screening for malignant neoplasm of breast: Secondary | ICD-10-CM

## 2023-10-11 NOTE — Patient Instructions (Signed)
 Explained breast self awareness with Satine Drusilla Gerlach. Patient did not need a Pap smear today due to last Pap smear and HPV typing was 03/31/2019. Let her know BCCCP will cover Pap smears and HPV typing every 5 years unless has a history of abnormal Pap smears. Referred patient to the [facility] for a diagnostic mammogram. Appointment scheduled Thursday, October 11, 2023 at 1520. Patient aware of appointment and will be there. Kamorah Drusilla Gerlach verbalized understanding.  Rawad Bochicchio, Dela Favor, RN 2:47 PM

## 2023-10-11 NOTE — Progress Notes (Signed)
 [name] is a 58 y.o. female who presents to [facility] clinic today with complaint of left breast pain x 2 months that starts at the center lower breast radiating to her back. Patient states the pain is constant and rates between a 6-7 out of 10.    Pap Smear: Pap smear not completed today. Last Pap smear was 03/31/2019 at [facility] and was normal with negative HPV. Per patient has no history of an abnormal Pap smear. Last Pap smear result is available in Epic.    Physical exam: Breasts Breasts symmetrical. No skin abnormalities bilateral breasts. No nipple retraction bilateral breasts. No nipple discharge bilateral breasts. No lymphadenopathy. No lumps palpated bilateral breasts. Complaints of diffuse left breast pain on exam.     MS DIGITAL SCREENING TOMO BILATERAL Result Date: 08/30/2022 CLINICAL DATA:  Screening. EXAM: DIGITAL SCREENING BILATERAL MAMMOGRAM WITH TOMOSYNTHESIS AND CAD TECHNIQUE: Bilateral screening digital craniocaudal and mediolateral oblique mammograms were obtained. Bilateral screening digital breast tomosynthesis was performed. The images were evaluated with computer-aided detection. COMPARISON:  Previous exam(s). ACR Breast Density Category c: The breasts are heterogeneously dense, which may obscure small masses. FINDINGS: There are no findings suspicious for malignancy. IMPRESSION: No mammographic evidence of malignancy. A result letter of this screening mammogram will be mailed directly to the patient. RECOMMENDATION: Screening mammogram in one year. (Code:SM-B-01Y) BI-RADS CATEGORY  1: Negative. Electronically Signed   By: Dobrinka  Dimitrova M.D.   On: 08/30/2022 14:08   MS DIGITAL SCREENING TOMO BILATERAL Result Date: 08/25/2021 CLINICAL DATA:  Screening. EXAM: DIGITAL SCREENING BILATERAL MAMMOGRAM WITH TOMOSYNTHESIS AND CAD TECHNIQUE: Bilateral screening digital craniocaudal and mediolateral oblique mammograms were  obtained. Bilateral screening digital breast tomosynthesis was performed. The images were evaluated with computer-aided detection. COMPARISON:  Previous exam(s). ACR Breast Density Category c: The breast tissue is heterogeneously dense, which may obscure small masses. FINDINGS: There are no findings suspicious for malignancy. IMPRESSION: No mammographic evidence of malignancy. A result letter of this screening mammogram will be mailed directly to the patient. RECOMMENDATION: Screening mammogram in one year. (Code:SM-B-01Y) BI-RADS CATEGORY  1: Negative. Electronically Signed   By: Catherin Closs M.D.   On: 08/25/2021 08:18   MS DIGITAL SCREENING TOMO BILATERAL Result Date: 05/22/2020 CLINICAL DATA:  Screening. EXAM: DIGITAL SCREENING BILATERAL MAMMOGRAM WITH TOMO AND CAD COMPARISON:  Previous exam(s). ACR Breast Density Category c: The breast tissue is heterogeneously dense, which may obscure small masses. FINDINGS: There are no findings suspicious for malignancy. Images were processed with CAD. IMPRESSION: No mammographic evidence of malignancy. A result letter of this screening mammogram will be mailed directly to the patient. RECOMMENDATION: Screening mammogram in one year. (Code:SM-B-01Y) BI-RADS CATEGORY  1: Negative. Electronically Signed   By: Anitra Barn M.D.   On: 05/22/2020 08:41    Pelvic/Bimanual Pap is not indicated today per BCCCP guidelines.   Smoking History: Patient has never smoked.   Patient Navigation: Patient education provided. Access to services provided for patient through [place] program. Spanish interpreter Sheri Mann from [facility] provided.   Colorectal Cancer Screening: Per patient has never had colonoscopy completed. FIT Test completed 01/24/2023 and negative. No complaints today.    Breast and Cervical Cancer Risk Assessment: Patient does not have family history of breast cancer, known genetic mutations, or radiation treatment to the chest before age 63. Patient does  not have history of cervical dysplasia, immunocompromised, or DES exposure in-utero.  Risk Scores as of Encounter on 10/11/2023  Sheri Mann           5-year 0.96%   Lifetime 6.29%   This patient is Hisp[place]/Latina but has no documented birth country, so the [place] model used data from [place] patients to calculate their risk score. Document a birth country in the Demographics activity for a more accurate score.         Last calculated by Rudy Costain, CMA on 10/11/2023 at  2:41 PM        A: BCCCP exam without pap smear Complaint of left breast pain.  P: Referred patient to the [facility] for a diagnostic mammogram. Appointment scheduled Thursday, October 11, 2023 at 1520.  Stefan Edge, RN 10/11/2023 2:47 PM

## 2023-10-18 ENCOUNTER — Ambulatory Visit: Payer: Self-pay | Attending: Nurse Practitioner

## 2023-10-18 DIAGNOSIS — Z23 Encounter for immunization: Secondary | ICD-10-CM

## 2023-10-18 NOTE — Progress Notes (Signed)
 Hep B vaccine administered in right deltoid  and TDAP  administered in left deltoid  per protocols.  Information sheet given. Patient denies and pain or discomfort at injection site. Tolerated injection well no reaction.

## 2023-10-23 ENCOUNTER — Ambulatory Visit: Payer: Self-pay

## 2023-10-23 ENCOUNTER — Encounter: Payer: Self-pay | Admitting: Internal Medicine

## 2023-10-23 ENCOUNTER — Ambulatory Visit: Payer: Self-pay | Attending: Internal Medicine | Admitting: Internal Medicine

## 2023-10-23 ENCOUNTER — Other Ambulatory Visit: Payer: Self-pay

## 2023-10-23 VITALS — BP 123/82 | HR 71 | Temp 98.1°F | Ht 59.0 in | Wt 172.0 lb

## 2023-10-23 DIAGNOSIS — M549 Dorsalgia, unspecified: Secondary | ICD-10-CM

## 2023-10-23 MED ORDER — DICLOFENAC SODIUM 50 MG PO TBEC
50.0000 mg | DELAYED_RELEASE_TABLET | Freq: Two times a day (BID) | ORAL | 0 refills | Status: DC
  Filled 2023-10-23: qty 20, 10d supply, fill #0

## 2023-10-23 MED ORDER — METHOCARBAMOL 500 MG PO TABS
500.0000 mg | ORAL_TABLET | Freq: Every evening | ORAL | 0 refills | Status: DC | PRN
Start: 2023-10-23 — End: 2024-01-09
  Filled 2023-10-23: qty 10, 10d supply, fill #0

## 2023-10-23 NOTE — Telephone Encounter (Signed)
 Copied from CRM [id number]. Topic: Clinical - Red Word Triage >> Oct 23, 2023 11:29 AM Phil Braun wrote: Red Word that prompted transfer to Nurse Triage:   Pt is having back pain and is about an 8 on a scale of 1-10. The pain is on her left side. Pt doesn't remember doing anything to cause this pain.  Chief Complaint: Back pain Symptoms: 7 or 8/10 Upper Back pain, burning in upper back Frequency:  X 3 months and ongoing Pertinent Negatives: Patient denies numbness, tingling, fever,  Disposition: [] ED /[] Urgent Care (no appt availability in office) / [x] Appointment(In office/virtual)/ []  [facility]/ [] Home Care/ [] Refused Recommended Disposition /[] [facility]/ []  Follow-up with PCP Additional Notes: Durenda Gilford - Spanish interpreter. When pain increases pt states she feels like her left breast is heavy: no redness or pain in breast  Reason for Disposition  [1] MODERATE back pain (e.g., interferes with normal activities) AND [2] present > 3 days  Answer Assessment - Initial Assessment Questions 1. ONSET: "When did the pain begin?"      X 3 months 2. LOCATION: "Where does it hurt?" (upper, mid or lower back)     Upper Back pain 3. SEVERITY: "How bad is the pain?"  (e.g., Scale 1-10; mild, moderate, or severe)   - MILD (1-3): Doesn't interfere with normal activities.    - MODERATE (4-7): Interferes with normal activities or awakens from sleep.    - SEVERE (8-10): Excruciating pain, unable to do any normal activities.      7 or 8/10 4. PATTERN: "Is the pain constant?" (e.g., yes, no; constant, intermittent)      Constant 5. RADIATION: "Does the pain shoot into your legs or somewhere else?"     no 6. CAUSE:  "What do you think is causing the back pain?"      unknown 7. BACK OVERUSE:  "Any recent lifting of heavy objects, strenuous work or exercise?"     unknown 8. MEDICINES: "What have you taken so far for the pain?" (e.g., nothing, acetaminophen , NSAIDS)      Using tylenol  for pain but does not help much 9. NEUROLOGIC SYMPTOMS: "Do you have any weakness, numbness, or problems with bowel/bladder control?"     Burning & pain  10. OTHER SYMPTOMS: "Do you have any other symptoms?" (e.g., fever, abdomen pain, burning with urination, blood in urine)       no 11. PREGNANCY: "Is there any chance you are pregnant?" "When was your last menstrual period?"       N/a  Protocols used: Back Pain-A-AH

## 2023-10-23 NOTE — Patient Instructions (Addendum)
 [facility] Department - Primary Care Clinic Phone: [phone] Address: [address] [place], [place], [place] [address]

## 2023-10-23 NOTE — Progress Notes (Signed)
 Patient ID: Sheri Mann, female    DOB: 28-Jan-1966  MRN: [id number]  CC: Back Pain (Intermittent back pain X3 mo - please see ER notes/Reports taking amlodipine  intermittently. Reports not taking diclofenac  due to unawareness of remaining refills  )   Subjective: Sheri Mann is a 58 y.o. female who presents for UC visit.  PCP is Sheri Mann Her concerns today include:  Pt with hx of HTN, LT sided sciatica  [employer] Language interpreter used during this encounter. #Enrique [id number]  Pt c/o intermittent LT upper back pain x 3 mths. She points to area below and over shoulder blade.  No initiating factors. Endorses radiation to upper arm at times.  No numbness or tingling.  No neck pain.  Pain a little when laying down Worse when she tries to carry something heavy including her purse.   Also worse with movement of shoulder jt Takes Tylenol  650 mg 2 times a daywhich helps a little.   Pt has letter with her that is written in Spanish.  She is needed some additional vaccines to complete an immigration document.  She had Tdapt and 1st Hep B shot from [place]  earlier this mth.  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/02/2023   Greater trochanteric bursitis of left hip 05/02/2023   Acute nonintractable headache 06/29/2021   Acute non-recurrent frontal sinusitis 03/30/2021   Language barrier 02/06/2017   Post-menopausal bleeding 11/01/2016   Thickened endometrium 11/01/2016   Gastroesophageal reflux disease 10/20/2016   Hypokalemia 10/20/2016   Abdominal pain, epigastric 08/05/2015   Perimenopausal vasomotor symptoms 01/28/2015   Positive D dimer 08/24/2014   Left medial knee pain 08/20/2014   Low back pain radiating to left leg 04/10/2014   Allergic rhinitis 04/10/2014     Current Outpatient Medications on File Prior to Visit  Medication Sig Dispense Refill   amLODipine   (NORVASC ) 5 MG tablet Take 1 tablet (5 mg total) by mouth daily. 90 tablet 1   No current facility-administered medications on file prior to visit.    Allergies  Allergen Reactions   Lisinopril  Cough    Social History   Socioeconomic History   Marital status: Married    Spouse name: Not on file   Number of children: 5   Years of education: 2    Highest education level: 2nd grade  Occupational History   Occupation: Unemployed   Tobacco Use   Smoking status: Never   Smokeless tobacco: Never  Vaping Use   Vaping status: Never Used  Substance and Sexual Activity   Alcohol use: No   Drug use: No   Sexual activity: Yes    Birth control/protection: Surgical  Other Topics Concern   Not on file  Social History Narrative   Lives with husband and brother in Social [place].    5 children grown and married.    Cannot read or write in spanish.    Husband can read and write in spanish or english.    Social Drivers of Corporate investment banker Strain: Low Risk  (08/30/2023)   Overall Financial Resource Strain (CARDIA)    Difficulty of Paying Living Expenses: Not hard at all  Food Insecurity: No Food Insecurity (10/11/2023)   Hunger Vital Sign    Worried About Running Out of Food in the Last Year: Never true    Ran Out of Food in the Last Year: Never true  Transportation Needs: No Transportation Needs (10/11/2023)   PRAPARE - Administrator, Civil Service (Medical): No    Lack of Transportation (Non-Medical): No  Physical Activity: Insufficiently Active (08/30/2023)   Exercise Vital Sign    Days of Exercise per Week: 1 day    Minutes of Exercise per Session: 30 min  Stress: No Stress Concern Present (08/30/2023)   [employer] of Occupational Health - Occupational Stress Questionnaire    Feeling of Stress : Not at all  Social Connections: Moderately Integrated (08/30/2023)   Social Connection and Isolation Panel [NHANES]    Frequency of Communication with Friends and  Family: Patient unable to answer    Frequency of Social Gatherings with Friends and Family: More than three times a week    Attends Religious Services: More than 4 times per year    Active Member of [employer] or Organizations: No    Attends Banker Meetings: Never    Marital Status: Married  Catering manager Violence: Not At Risk (08/30/2023)   Humiliation, Afraid, Rape, and Kick questionnaire    Fear of Current or Ex-Partner: No    Emotionally Abused: No    Physically Abused: No    Sexually Abused: No    Family History  Problem Relation Age of Onset   Cancer Neg Hx    Heart disease Neg Hx    Diabetes Neg Hx    Hypertension Neg Hx    Breast cancer Neg Hx     Past Surgical History:  Procedure Laterality Date   BREAST EXCISIONAL BIOPSY Left    BREAST LUMPECTOMY     EYE SURGERY  2012   TUBAL LIGATION  2000    ROS: Review of Systems Negative except as stated above  PHYSICAL EXAM: BP 123/82 (BP Location: Left Arm, Patient Position: Sitting, Cuff Size: Normal)   Pulse 71   Temp 98.1 F (36.7 C) (Oral)   Ht 4\' 11"  (1.499 m)   Wt 172 lb (78 kg)   LMP 11/11/2016 (Exact Date)   SpO2 98%   BMI 34.74 kg/m   Physical Exam  General appearance - alert, well appearing, and in no distress Mental status - normal mood, behavior, speech, dress, motor activity, and thought processes Musculoskeletal -LT shoulder jt:  no tenderness on palpation of the anterior shoulder joint.  Mild tenderness on palpation over the shoulder blade. mild discomfort with passive range of motion in all directions. Drop arm test negative      Latest Ref Rng & Units 09/11/2023   10:04 AM 04/24/2023    2:52 PM 01/22/2023    2:33 PM  CMP  Glucose 70 - 99 mg/dL 94  94  96   BUN 6 - 20 mg/dL 10  15  11    Creatinine 0.44 - 1.00 mg/dL 1.61  0.96  0.45   Sodium 135 - 145 mmol/L 139  143  140   Potassium 3.5 - 5.1 mmol/L 3.8  4.4  4.3   Chloride 98 - 111 mmol/L 108  107  106   CO2 22 - 32 mmol/L 23  25   24    Calcium 8.9 - 10.3 mg/dL 9.4  40.9  9.5   Total Protein 6.0 - 8.5 g/dL  7.2  6.8   Total Bilirubin 0.0 - 1.2 mg/dL  <8.1  <1.9   Alkaline Phos 44 - 121 IU/L  155  149   AST 0 - 40 IU/L  19  25   ALT 0 -  32 IU/L  16  28    Lipid Panel     Component Value Date/Time   CHOL 193 01/22/2023 1433   TRIG 213 (H) 01/22/2023 1433   HDL 43 01/22/2023 1433   CHOLHDL 4.5 (H) 01/22/2023 1433   CHOLHDL 3.9 04/09/2014 0000   VLDL 28 04/09/2014 0000   LDLCALC 113 (H) 01/22/2023 1433    CBC    Component Value Date/Time   WBC 4.4 09/11/2023 1004   RBC 4.18 09/11/2023 1004   HGB 12.2 09/11/2023 1004   HGB 12.5 01/22/2023 1433   HCT 37.4 09/11/2023 1004   HCT 38.1 01/22/2023 1433   PLT 173 09/11/2023 1004   PLT 179 01/22/2023 1433   MCV 89.5 09/11/2023 1004   MCV 87 01/22/2023 1433   MCH 29.2 09/11/2023 1004   MCHC 32.6 09/11/2023 1004   RDW 12.4 09/11/2023 1004   RDW 12.5 01/22/2023 1433   LYMPHSABS 1.9 01/22/2023 1433   MONOABS 0.4 12/27/2016 0636   EOSABS 0.1 01/22/2023 1433   BASOSABS 0.0 01/22/2023 1433    ASSESSMENT AND PLAN: 1. Upper back pain on left side (Primary) Likely from the jt itself or rotator cuff muscle issues. Will try her with short-term course of Voltaren  orally.  Patient to take with food.  Will also give short trial of Robaxin  to take at bedtime.  Advised to purchase IcyHot rub over-the-counter and use it twice a day.  Advised no heavy lifting or carrying of heavy purse on the left shoulder.  If no improvement in a few weeks, she should follow-up with her PCP for referral to orthopedics - diclofenac  (VOLTAREN ) 50 MG EC tablet; Take 1 tablet (50 mg total) by mouth 2 (two) times daily. Take with food  Dispense: 20 tablet; Refill: 0 - methocarbamol  (ROBAXIN ) 500 MG tablet; Take 1 tablet (500 mg total) by mouth at bedtime as needed for muscle spasms.  Dispense: 10 tablet; Refill: 0  Advise that she will need to get the other needed vaccines from the [facility] as we do not carry them here.  Patient was given the opportunity to ask questions.  Patient verbalized understanding of the plan and was able to repeat key elements of the plan.   This documentation was completed using Paediatric nurse.  Any transcriptional errors are unintentional.  No orders of the defined types were placed in this encounter.    Requested Prescriptions   Signed Prescriptions Disp Refills   diclofenac  (VOLTAREN ) 50 MG EC tablet 20 tablet 0    Sig: Take 1 tablet (50 mg total) by mouth 2 (two) times daily. Take with food   methocarbamol  (ROBAXIN ) 500 MG tablet 10 tablet 0    Sig: Take 1 tablet (500 mg total) by mouth at bedtime as needed for muscle spasms.    No follow-ups on file.  Concetta Dee, MD, FACP

## 2023-12-31 ENCOUNTER — Ambulatory Visit: Payer: Self-pay

## 2023-12-31 ENCOUNTER — Other Ambulatory Visit: Payer: Self-pay

## 2023-12-31 DIAGNOSIS — I10 Essential (primary) hypertension: Secondary | ICD-10-CM

## 2023-12-31 MED ORDER — AMLODIPINE BESYLATE 5 MG PO TABS
5.0000 mg | ORAL_TABLET | Freq: Every day | ORAL | 1 refills | Status: DC
  Filled 2023-12-31: qty 90, 90d supply, fill #0

## 2023-12-31 NOTE — Telephone Encounter (Signed)
 Copied from CRM [id number]. Topic: Clinical - Red Word Triage >> Dec 31, 2023  9:47 AM Jasmin G wrote: Red Word that prompted transfer to Nurse Triage: Harsh pain on abdomen's left side, waist hurts as well. Feels hot. Patient wasn't able to sleep due to the pain.

## 2023-12-31 NOTE — Telephone Encounter (Signed)
 FYI Only or Action Required?: FYI only for provider.  Patient was last seen in primary care on 10/23/2023 by [name], MD.  Called Nurse Triage reporting Abdominal Pain.  Symptoms began several days ago.  Interventions attempted: Nothing.  Symptoms are: gradually worsening.  Triage Disposition: See Physician Within 24 Hours- No appts avail. Pt going to Urgent Care. Patient also requesting refill on Amlodipine . Medication pended  Patient/caregiver understands and will follow disposition?: Yes       Copied from CRM [id number]. Topic: Clinical - Red Word Triage >> Dec 31, 2023 10:39 AM Sheri Mann wrote: [facility] that prompted transfer to Nurse Triage: Patient returning call, states previous call was disconnected.   Patient is having left side abdominal pain, that's hot to touch, with burning sensation. Spanish Interpreter, Sheri Mann assisted with this call Reason for Disposition  [1] MODERATE pain (e.g., interferes with normal activities) AND [2] pain comes and goes (cramps) AND [3] present > 24 hours  (Exception: Pain with Vomiting or Diarrhea - see that Guideline.)  Answer Assessment - Initial Assessment Questions 1. LOCATION: Where does it hurt?      Left lower abdomen  2. RADIATION: Does the pain shoot anywhere else? (e.g., chest, back)     Shoots to left side  3. ONSET: When did the pain begin? (e.g., minutes, hours or days ago)      3 days ago, worse yesterday   4. SUDDEN: Gradual or sudden onset?     Gradual pain  5. PATTERN Does the pain come and go, or is it constant?     Constant pain  6. SEVERITY: How bad is the pain?  (e.g., Scale 1-10; mild, moderate, or severe)     8-9/10 pain  7. RECURRENT SYMPTOM: Have you ever had this type of stomach pain before? If Yes, ask: When was the last time? and What happened that time?      Yes, had this pain before. Was told she had a UTI. .Was started on Abx  8. CAUSE: What do you think is causing  the stomach pain? (e.g., gallstones, recent abdominal surgery)     Thinks UTI has returned  9. RELIEVING/AGGRAVATING FACTORS: What makes it better or worse? (e.g., antacids, bending or twisting motion, bowel movement)     The pain is worse if I dont go to the bathroom quick enough  10. OTHER SYMPTOMS: Do you have any other symptoms? (e.g., back pain, diarrhea, fever, urination pain, vomiting)       Urination urgency  11. PREGNANCY: Is there any chance you are pregnant? When was your last menstrual period?       No  Protocols used: Abdominal Pain - Female-A-AH

## 2023-12-31 NOTE — Telephone Encounter (Signed)
 Amlodipine  sent for refill. Patient has appointment 01/09/2024

## 2024-01-01 ENCOUNTER — Ambulatory Visit: Payer: Self-pay | Admitting: Family

## 2024-01-01 ENCOUNTER — Ambulatory Visit (INDEPENDENT_AMBULATORY_CARE_PROVIDER_SITE_OTHER): Payer: Self-pay | Admitting: Family

## 2024-01-01 ENCOUNTER — Encounter: Payer: Self-pay | Admitting: Family

## 2024-01-01 ENCOUNTER — Other Ambulatory Visit (HOSPITAL_COMMUNITY)
Admission: RE | Admit: 2024-01-01 | Discharge: 2024-01-01 | Disposition: A | Discharge status: Home / Self Care | Payer: Self-pay | Source: Ambulatory Visit | Attending: Family | Admitting: Family

## 2024-01-01 ENCOUNTER — Other Ambulatory Visit: Payer: Self-pay

## 2024-01-01 VITALS — BP 117/78 | HR 77 | Temp 98.3°F | Resp 16 | Ht 59.0 in | Wt 169.6 lb

## 2024-01-01 DIAGNOSIS — M549 Dorsalgia, unspecified: Secondary | ICD-10-CM

## 2024-01-01 DIAGNOSIS — G629 Polyneuropathy, unspecified: Secondary | ICD-10-CM

## 2024-01-01 DIAGNOSIS — R109 Unspecified abdominal pain: Secondary | ICD-10-CM

## 2024-01-01 DIAGNOSIS — Z603 Acculturation difficulty: Secondary | ICD-10-CM

## 2024-01-01 DIAGNOSIS — Z758 Other problems related to medical facilities and other health care: Secondary | ICD-10-CM

## 2024-01-01 DIAGNOSIS — R350 Frequency of micturition: Secondary | ICD-10-CM

## 2024-01-01 LAB — POCT URINALYSIS DIP (CLINITEK)
Bilirubin, UA: NEGATIVE
Glucose, UA: NEGATIVE mg/dL
Ketones, POC UA: NEGATIVE mg/dL
Leukocytes, UA: NEGATIVE
Nitrite, UA: NEGATIVE
POC PROTEIN,UA: NEGATIVE
Spec Grav, UA: 1.025 (ref 1.010–1.025)
Urobilinogen, UA: 0.2 U/dL
pH, UA: 5.5 (ref 5.0–8.0)

## 2024-01-01 MED ORDER — MELOXICAM 7.5 MG PO TABS
7.5000 mg | ORAL_TABLET | Freq: Every day | ORAL | 0 refills | Status: DC
  Filled 2024-01-01: qty 90, 90d supply, fill #0

## 2024-01-01 MED ORDER — GABAPENTIN 300 MG PO CAPS
300.0000 mg | ORAL_CAPSULE | Freq: Every day | ORAL | 0 refills | Status: AC
  Filled 2024-01-01: qty 90, 90d supply, fill #0

## 2024-01-01 NOTE — Progress Notes (Signed)
 Left lower abdominal pain, lower back pain, numbness on left l, frequent urination

## 2024-01-01 NOTE — Progress Notes (Signed)
 Patient ID: Sheri Mann, female    DOB: 1965/06/20  MRN: [id number]  CC: Back Pain  Subjective: Sheri Mann is a 58 y.o. female who presents for back pain. She is accompanied by her daughter in law.  Her concerns today include:  - Lower abdominal pain radiating to back for 1 week. Denies recent trauma/injury and red flag symptoms. Taking over-the-counter medication with minimal relief. - Left leg numbness. Denies red flag symptoms. - Frequent urination. Denies red flag symptoms.   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/02/2023   Greater trochanteric bursitis of left hip 05/02/2023   Acute nonintractable headache 06/29/2021   Acute non-recurrent frontal sinusitis 03/30/2021   Language barrier 02/06/2017   Post-menopausal bleeding 11/01/2016   Thickened endometrium 11/01/2016   Gastroesophageal reflux disease 10/20/2016   Hypokalemia 10/20/2016   Abdominal pain, epigastric 08/05/2015   Perimenopausal vasomotor symptoms 01/28/2015   Positive D dimer 08/24/2014   Left medial knee pain 08/20/2014   Low back pain radiating to left leg 04/10/2014   Allergic rhinitis 04/10/2014     Current Outpatient Medications on File Prior to Visit  Medication Sig Dispense Refill   amLODipine  (NORVASC ) 5 MG tablet Take 1 tablet (5 mg total) by mouth daily. 90 tablet 1   methocarbamol  (ROBAXIN ) 500 MG tablet Take 1 tablet (500 mg total) by mouth at bedtime as needed for muscle spasms. (Patient not taking: Reported on 01/01/2024) 10 tablet 0   No current facility-administered medications on file prior to visit.    Allergies  Allergen Reactions   Lisinopril  Cough    Social History   Socioeconomic History   Marital status: Married    Spouse name: Not on file   Number of children: 5   Years of education: 2    Highest education level: 2nd grade  Occupational History   Occupation: Unemployed   Tobacco Use   Smoking  status: Never   Smokeless tobacco: Never  Vaping Use   Vaping status: Never Used  Substance and Sexual Activity   Alcohol use: No   Drug use: No   Sexual activity: Yes    Birth control/protection: Surgical  Other Topics Concern   Not on file  Social History Narrative   Lives with husband and brother in [employer] [place].    5 children grown and married.    Cannot read or write in spanish.    Husband can read and write in spanish or english.    Social Drivers of Corporate investment banker Strain: Low Risk  (08/30/2023)   Overall Financial Resource Strain (CARDIA)    Difficulty of Paying Living Expenses: Not hard at all  Food Insecurity: No Food Insecurity (10/11/2023)   Hunger Vital Sign    Worried About Running Out of Food in the Last Year: Never true    Ran Out of Food in the Last Year: Never true  Transportation Needs: No Transportation Needs (10/11/2023)   PRAPARE - Administrator, Civil Service (Medical): No    Lack of Transportation (Non-Medical): No  Physical Activity: Insufficiently Active (08/30/2023)   Exercise Vital Sign    Days of Exercise per Week: 1 day    Minutes of Exercise per Session: 30 min  Stress: No Stress Concern Present (08/30/2023)   [employer] of Occupational Health - Occupational Stress Questionnaire    Feeling of Stress : Not at all  Social Connections: Moderately Integrated (08/30/2023)  Social Connection and Isolation Panel    Frequency of Communication with Friends and Family: Patient unable to answer    Frequency of Social Gatherings with Friends and Family: More than three times a week    Attends Religious Services: More than 4 times per year    Active Member of [employer] or Organizations: No    Attends Banker Meetings: Never    Marital Status: Married  Catering manager Violence: Not At Risk (08/30/2023)   Humiliation, Afraid, Rape, and Kick questionnaire    Fear of Current or Ex-Partner: No    Emotionally Abused: No     Physically Abused: No    Sexually Abused: No    Family History  Problem Relation Age of Onset   Cancer Neg Hx    Heart disease Neg Hx    Diabetes Neg Hx    Hypertension Neg Hx    Breast cancer Neg Hx     Past Surgical History:  Procedure Laterality Date   BREAST EXCISIONAL BIOPSY Left    BREAST LUMPECTOMY     EYE SURGERY  2012   TUBAL LIGATION  2000    ROS: Review of Systems Negative except as stated above  PHYSICAL EXAM: BP 117/78   Pulse 77   Temp 98.3 F (36.8 C) (Oral)   Resp 16   Ht 4' 11 (1.499 m)   Wt 169 lb 9.6 oz (76.9 kg)   LMP 11/11/2016 (Exact Date)   SpO2 97%   BMI 34.26 kg/m   Physical Exam HENT:     Head: Normocephalic and atraumatic.     Nose: Nose normal.     Mouth/Throat:     Mouth: Mucous membranes are moist.     Pharynx: Oropharynx is clear.  Eyes:     Extraocular Movements: Extraocular movements intact.     Conjunctiva/sclera: Conjunctivae normal.     Pupils: Pupils are equal, round, and reactive to light.  Cardiovascular:     Rate and Rhythm: Normal rate and regular rhythm.     Pulses: Normal pulses.     Heart sounds: Normal heart sounds.  Pulmonary:     Effort: Pulmonary effort is normal.     Breath sounds: Normal breath sounds.  Abdominal:     General: Bowel sounds are normal.     Palpations: Abdomen is soft.     Tenderness: There is abdominal tenderness.  Musculoskeletal:        General: Normal range of motion.     Right shoulder: Normal.     Left shoulder: Normal.     Right upper arm: Normal.     Left upper arm: Normal.     Right elbow: Normal.     Left elbow: Normal.     Right forearm: Normal.     Left forearm: Normal.     Right wrist: Normal.     Left wrist: Normal.     Right hand: Normal.     Left hand: Normal.     Cervical back: Normal, normal range of motion and neck supple.     Thoracic back: Tenderness present.     Lumbar back: Tenderness present.     Right hip: Normal.     Left hip: Normal.     Right  upper leg: Normal.     Left upper leg: Normal.     Right knee: Normal.     Left knee: Normal.     Right lower leg: Normal.     Left lower leg:  Normal.     Right ankle: Normal.     Left ankle: Normal.     Right foot: Normal.     Left foot: Normal.  Neurological:     General: No focal deficit present.     Mental Status: She is alert and oriented to person, place, and time.  Psychiatric:        Mood and Affect: Mood normal.        Behavior: Behavior normal.     ASSESSMENT AND PLAN: 1. Abdominal pain, unspecified abdominal location (Primary) - Routine screening.  - CMP14+EGFR - CBC - Amylase - Lipase - Cervicovaginal ancillary only  2. Back pain, unspecified back location, unspecified back pain laterality, unspecified chronicity - Meloxicam  as prescribed. Counseled on medication adherence/adverse effects.  - Diclofenac  discontinued due to ineffective. - Follow-up with primary provider as scheduled. - meloxicam  (MOBIC ) 7.5 MG tablet; Take 1 tablet (7.5 mg total) by mouth daily.  Dispense: 90 tablet; Refill: 0  3. Neuropathy - Gabapentin  as prescribed. Counseled on medication adherence/adverse effects.  - Follow-up with primary provider as scheduled. - gabapentin  (NEURONTIN ) 300 MG capsule; Take 1 capsule (300 mg total) by mouth at bedtime.  Dispense: 90 capsule; Refill: 0  4. Frequent urination - Routine screening.  - POCT URINALYSIS DIP (CLINITEK); Future - Urine Culture - POCT URINALYSIS DIP (CLINITEK)  5. Language barrier - Patient accompanied by her daughter who serves as interpreter and part-historian.   Patient was given the opportunity to ask questions.  Patient verbalized understanding of the plan and was able to repeat key elements of the plan. Patient was given clear instructions to go to Emergency Department or return to medical center if symptoms don't improve, worsen, or new problems develop.The patient verbalized understanding.   Orders Placed This  Encounter  Procedures   Urine Culture   CMP14+EGFR   CBC   Amylase   Lipase   POCT URINALYSIS DIP (CLINITEK)     Requested Prescriptions   Signed Prescriptions Disp Refills   meloxicam  (MOBIC ) 7.5 MG tablet 90 tablet 0    Sig: Take 1 tablet (7.5 mg total) by mouth daily.   gabapentin  (NEURONTIN ) 300 MG capsule 90 capsule 0    Sig: Take 1 capsule (300 mg total) by mouth at bedtime.    Return for Follow-Up or next available with Haze Servant, NP.  Greig JINNY Drones, NP

## 2024-01-02 LAB — AMYLASE: Amylase: 38 U/L (ref 31–110)

## 2024-01-02 LAB — CERVICOVAGINAL ANCILLARY ONLY
Bacterial Vaginitis (gardnerella): NEGATIVE
Candida Glabrata: NEGATIVE
Candida Vaginitis: NEGATIVE
Chlamydia: NEGATIVE
Comment: NEGATIVE
Comment: NEGATIVE
Comment: NEGATIVE
Comment: NEGATIVE
Comment: NEGATIVE
Comment: NORMAL
Neisseria Gonorrhea: NEGATIVE
Trichomonas: NEGATIVE

## 2024-01-02 LAB — CMP14+EGFR
ALT: 24 IU/L (ref 0–32)
AST: 23 IU/L (ref 0–40)
Albumin: 4.3 g/dL (ref 3.8–4.9)
Alkaline Phosphatase: 141 IU/L — ABNORMAL HIGH (ref 44–121)
BUN/Creatinine Ratio: 17 (ref 9–23)
BUN: 13 mg/dL (ref 6–24)
Bilirubin Total: 0.3 mg/dL (ref 0.0–1.2)
CO2: 21 mmol/L (ref 20–29)
Calcium: 10.3 mg/dL — ABNORMAL HIGH (ref 8.7–10.2)
Chloride: 103 mmol/L (ref 96–106)
Creatinine, Ser: 0.75 mg/dL (ref 0.57–1.00)
Globulin, Total: 2.8 g/dL (ref 1.5–4.5)
Glucose: 120 mg/dL — ABNORMAL HIGH (ref 70–99)
Potassium: 3.7 mmol/L (ref 3.5–5.2)
Sodium: 140 mmol/L (ref 134–144)
Total Protein: 7.1 g/dL (ref 6.0–8.5)
eGFR: 93 mL/min/1.73 (ref >59)

## 2024-01-02 LAB — CBC
Hematocrit: 41.3 % (ref 34.0–46.6)
Hemoglobin: 13.7 g/dL (ref 11.1–15.9)
MCH: 30 pg (ref 26.6–33.0)
MCHC: 33.2 g/dL (ref 31.5–35.7)
MCV: 90 fL (ref 79–97)
Platelets: 225 x10E3/uL (ref 150–450)
RBC: 4.57 x10E6/uL (ref 3.77–5.28)
RDW: 12.8 % (ref 11.7–15.4)
WBC: 6.3 x10E3/uL (ref 3.4–10.8)

## 2024-01-02 LAB — LIPASE: Lipase: 18 U/L (ref 14–72)

## 2024-01-03 LAB — URINE CULTURE

## 2024-01-08 ENCOUNTER — Telehealth: Payer: Self-pay | Admitting: Nurse Practitioner

## 2024-01-08 NOTE — Telephone Encounter (Signed)
 Pt confirmed appt

## 2024-01-09 ENCOUNTER — Ambulatory Visit: Payer: Self-pay | Attending: Nurse Practitioner | Admitting: Nurse Practitioner

## 2024-01-09 ENCOUNTER — Other Ambulatory Visit: Payer: Self-pay

## 2024-01-09 ENCOUNTER — Encounter: Payer: Self-pay | Admitting: Nurse Practitioner

## 2024-01-09 VITALS — BP 135/85 | HR 64 | Resp 19 | Ht 59.0 in | Wt 174.4 lb

## 2024-01-09 DIAGNOSIS — G8929 Other chronic pain: Secondary | ICD-10-CM

## 2024-01-09 DIAGNOSIS — Z0000 Encounter for general adult medical examination without abnormal findings: Secondary | ICD-10-CM

## 2024-01-09 DIAGNOSIS — M5432 Sciatica, left side: Secondary | ICD-10-CM

## 2024-01-09 DIAGNOSIS — M546 Pain in thoracic spine: Secondary | ICD-10-CM

## 2024-01-09 MED ORDER — DICLOFENAC SODIUM 75 MG PO TBEC
75.0000 mg | DELAYED_RELEASE_TABLET | Freq: Two times a day (BID) | ORAL | 2 refills | Status: DC
  Filled 2024-01-09: qty 60, 30d supply, fill #0

## 2024-01-09 MED ORDER — METHOCARBAMOL 500 MG PO TABS
500.0000 mg | ORAL_TABLET | Freq: Every evening | ORAL | 3 refills | Status: AC | PRN
  Filled 2024-01-09: qty 30, 30d supply, fill #0

## 2024-01-09 NOTE — Progress Notes (Addendum)
 Assessment & Plan:  Sheri Mann was seen today for annual exam.  Diagnoses and all orders for this visit:  Encounter for annual physical exam  Chronic midline thoracic back pain Patient was urged to apply for the financial assistance program.  They were instructed to inquire at the front desk about the application process for the [employer] discount, orange card or other financial assistance.     She would like to hold off on referral until her Financial assistance is approved -     diclofenac  (VOLTAREN ) 75 MG EC tablet; Take 1 tablet (75 mg total) by mouth 2 (two) times daily. For back pain -     methocarbamol  (ROBAXIN ) 500 MG tablet; Take 1 tablet (500 mg total) by mouth at bedtime as needed for muscle spasms. For back pain  Sciatica of left side Patient has been advised to apply for financial assistance and schedule to see our financial counselor.   -     diclofenac  (VOLTAREN ) 75 MG EC tablet; Take 1 tablet (75 mg total) by mouth 2 (two) times daily. For back pain -     methocarbamol  (ROBAXIN ) 500 MG tablet; Take 1 tablet (500 mg total) by mouth at bedtime as needed for muscle spasms. For back pain    Patient has been counseled on age-appropriate routine health concerns for screening and prevention. These are reviewed and up-to-date. Referrals have been placed accordingly. Immunizations are up-to-date or declined.    Subjective:   Chief Complaint  Patient presents with   Annual Exam    Sheri Mann 58 y.o. female presents to office today for annual physical exam  She has chronic low back pain and left hip pain.she has been evaluated by PMR in the past (05-02-2023) Tyelenol not effective however she report diclofenac  which was prescribed by PMR was effective in the past PMR NOTE: Chronic left -sided low back pain Left sided trochanteric bursitis No signs of sciatica Plan:  Refer to PT for low back rom, modalities, HEP (she did not follow up with PT and canceled  her follow up appts with PMR after first visit) Trial of diclofenac  50mg  bid with food May take tylenol  650mg  BID Ice to left hip 2-3 times daily Greater trochanter exercises were provided and described Xrays of low back to assess lumbar spine.      Review of Systems  Constitutional:  Negative for fever, malaise/fatigue and weight loss.  HENT: Negative.  Negative for nosebleeds.   Eyes: Negative.  Negative for blurred vision, double vision and photophobia.  Respiratory: Negative.  Negative for cough and shortness of breath.   Cardiovascular: Negative.  Negative for chest pain, palpitations and leg swelling.  Gastrointestinal: Negative.  Negative for heartburn, nausea and vomiting.  Genitourinary: Negative.   Musculoskeletal:  Positive for back pain and joint pain. Negative for myalgias.  Skin: Negative.   Neurological: Negative.  Negative for dizziness, focal weakness, seizures and headaches.  Endo/Heme/Allergies: Negative.   Psychiatric/Behavioral: Negative.  Negative for suicidal ideas.     Past Medical History:  Diagnosis Date   High cholesterol 2013    Hypertension    Obesity (BMI 30-39.9)    Urinary incontinence, mixed     Past Surgical History:  Procedure Laterality Date   BREAST EXCISIONAL BIOPSY Left    BREAST LUMPECTOMY     EYE SURGERY  2012   TUBAL LIGATION  2000    Family History  Problem Relation Age of Onset   Cancer Neg Hx  Heart disease Neg Hx    Diabetes Neg Hx    Hypertension Neg Hx    Breast cancer Neg Hx     Social History Reviewed with no changes to be made today.   Outpatient Medications Prior to Visit  Medication Sig Dispense Refill   amLODipine  (NORVASC ) 5 MG tablet Take 1 tablet (5 mg total) by mouth daily. 90 tablet 1   gabapentin  (NEURONTIN ) 300 MG capsule Take 1 capsule (300 mg total) by mouth at bedtime. 90 capsule 0   meloxicam  (MOBIC ) 7.5 MG tablet Take 1 tablet (7.5 mg total) by mouth daily. 90 tablet 0   methocarbamol  (ROBAXIN )  500 MG tablet Take 1 tablet (500 mg total) by mouth at bedtime as needed for muscle spasms. (Patient not taking: Reported on 01/09/2024) 10 tablet 0   No facility-administered medications prior to visit.    Allergies  Allergen Reactions   Lisinopril  Cough       Objective:    BP 135/85 (BP Location: Left Arm, Patient Position: Sitting, Cuff Size: Normal)   Pulse 64   Resp 19   Ht 4' 11 (1.499 m)   Wt 174 lb 6.4 oz (79.1 kg)   LMP 11/11/2016 (Exact Date)   SpO2 100%   BMI 35.22 kg/m  Wt Readings from Last 3 Encounters:  01/09/24 174 lb 6.4 oz (79.1 kg)  01/01/24 169 lb 9.6 oz (76.9 kg)  10/23/23 172 lb (78 kg)    Physical Exam Constitutional:      Appearance: She is well-developed.  HENT:     Head: Normocephalic and atraumatic.     Right Ear: Hearing, tympanic membrane, ear canal and external ear normal.     Left Ear: Hearing, tympanic membrane, ear canal and external ear normal.     Nose: Nose normal.     Right Turbinates: Not enlarged.     Left Turbinates: Not enlarged.     Mouth/Throat:     Lips: Pink.     Mouth: Mucous membranes are moist.     Dentition: No dental tenderness, gingival swelling, dental abscesses or gum lesions.     Pharynx: No oropharyngeal exudate.  Eyes:     General: No scleral icterus.       Right eye: No discharge.     Extraocular Movements: Extraocular movements intact.     Conjunctiva/sclera: Conjunctivae normal.     Pupils: Pupils are equal, round, and reactive to light.  Neck:     Thyroid: No thyromegaly.     Trachea: No tracheal deviation.  Cardiovascular:     Rate and Rhythm: Normal rate and regular rhythm.     Heart sounds: Normal heart sounds. No murmur heard.    No friction rub.  Pulmonary:     Effort: Pulmonary effort is normal. No accessory muscle usage or respiratory distress.     Breath sounds: Normal breath sounds. No decreased breath sounds, wheezing, rhonchi or rales.  Abdominal:     General: Bowel sounds are normal.  There is no distension.     Palpations: Abdomen is soft. There is no mass.     Tenderness: There is no abdominal tenderness. There is no right CVA tenderness, left CVA tenderness, guarding or rebound.     Hernia: No hernia is present.  Musculoskeletal:        General: No tenderness or deformity. Normal range of motion.     Cervical back: Normal range of motion and neck supple.  Lymphadenopathy:     Cervical: No  cervical adenopathy.  Skin:    General: Skin is warm and dry.     Findings: No erythema.  Neurological:     Mental Status: She is alert and oriented to person, place, and time.     Cranial Nerves: No cranial nerve deficit.     Motor: Motor function is intact.     Coordination: Coordination is intact. Coordination normal.     Gait: Gait is intact.     Deep Tendon Reflexes:     Reflex Scores:      Patellar reflexes are 1+ on the right side and 1+ on the left side. Psychiatric:        Attention and Perception: Attention normal.        Mood and Affect: Mood normal.        Speech: Speech normal.        Behavior: Behavior normal.        Thought Content: Thought content normal.        Judgment: Judgment normal.          Patient has been counseled extensively about nutrition and exercise as well as the importance of adherence with medications and regular follow-up. The patient was given clear instructions to go to ER or return to medical center if symptoms don't improve, worsen or new problems develop. The patient verbalized understanding.   Follow-up: Return in about 3 months (around 04/14/2024).   Haze LELON Servant, FNP-BC [facility] [place], [place] [phone]   01/09/2024, 5:15 PM

## 2024-01-17 ENCOUNTER — Other Ambulatory Visit: Payer: Self-pay

## 2024-01-18 ENCOUNTER — Other Ambulatory Visit: Payer: Self-pay

## 2024-01-27 ENCOUNTER — Other Ambulatory Visit: Payer: Self-pay

## 2024-01-27 ENCOUNTER — Encounter (HOSPITAL_COMMUNITY): Payer: Self-pay

## 2024-01-27 ENCOUNTER — Emergency Department (HOSPITAL_COMMUNITY): Payer: Self-pay

## 2024-01-27 ENCOUNTER — Emergency Department (HOSPITAL_COMMUNITY)
Admission: EM | Admit: 2024-01-27 | Discharge: 2024-01-27 | Disposition: A | Discharge status: Home / Self Care | Payer: Self-pay | Attending: Emergency Medicine | Admitting: Emergency Medicine

## 2024-01-27 DIAGNOSIS — Z79899 Other long term (current) drug therapy: Secondary | ICD-10-CM | POA: Insufficient documentation

## 2024-01-27 DIAGNOSIS — I10 Essential (primary) hypertension: Secondary | ICD-10-CM | POA: Insufficient documentation

## 2024-01-27 DIAGNOSIS — R1032 Left lower quadrant pain: Secondary | ICD-10-CM | POA: Insufficient documentation

## 2024-01-27 LAB — CBC
HCT: 41 % (ref 36.0–46.0)
Hemoglobin: 13.4 g/dL (ref 12.0–15.0)
MCH: 29 pg (ref 26.0–34.0)
MCHC: 32.7 g/dL (ref 30.0–36.0)
MCV: 88.7 fL (ref 80.0–100.0)
Platelets: 164 K/uL (ref 150–400)
RBC: 4.62 MIL/uL (ref 3.87–5.11)
RDW: 12 % (ref 11.5–15.5)
WBC: 4.8 K/uL (ref 4.0–10.5)
nRBC: 0 % (ref 0.0–0.2)

## 2024-01-27 LAB — COMPREHENSIVE METABOLIC PANEL WITH GFR
ALT: 47 U/L — ABNORMAL HIGH (ref 0–44)
AST: 41 U/L (ref 15–41)
Albumin: 3.3 g/dL — ABNORMAL LOW (ref 3.5–5.0)
Alkaline Phosphatase: 136 U/L — ABNORMAL HIGH (ref 38–126)
Anion gap: 5 (ref 5–15)
BUN: 11 mg/dL (ref 6–20)
CO2: 26 mmol/L (ref 22–32)
Calcium: 9.6 mg/dL (ref 8.9–10.3)
Chloride: 106 mmol/L (ref 98–111)
Creatinine, Ser: 0.81 mg/dL (ref 0.44–1.00)
GFR, Estimated: >60 mL/min (ref >60)
Glucose, Bld: 128 mg/dL — ABNORMAL HIGH (ref 70–99)
Potassium: 4.2 mmol/L (ref 3.5–5.1)
Sodium: 137 mmol/L (ref 135–145)
Total Bilirubin: 0.4 mg/dL (ref 0.0–1.2)
Total Protein: 7.1 g/dL (ref 6.5–8.1)

## 2024-01-27 LAB — URINALYSIS, ROUTINE W REFLEX MICROSCOPIC
Bacteria, UA: NONE SEEN
Bilirubin Urine: NEGATIVE
Glucose, UA: NEGATIVE mg/dL
Ketones, ur: NEGATIVE mg/dL
Leukocytes,Ua: NEGATIVE
Nitrite: NEGATIVE
Protein, ur: NEGATIVE mg/dL
Specific Gravity, Urine: 1.009 (ref 1.005–1.030)
pH: 6 (ref 5.0–8.0)

## 2024-01-27 LAB — LIPASE, BLOOD: Lipase: 32 U/L (ref 11–51)

## 2024-01-27 MED ORDER — OXYCODONE-ACETAMINOPHEN 5-325 MG PO TABS
1.0000 | ORAL_TABLET | ORAL | Status: DC | PRN
  Administered 2024-01-27: 1 via ORAL

## 2024-01-27 MED ORDER — SODIUM CHLORIDE 0.9 % IV BOLUS
1000.0000 mL | Freq: Once | INTRAVENOUS | Status: AC
  Administered 2024-01-27: 1000 mL via INTRAVENOUS

## 2024-01-27 MED ORDER — ONDANSETRON 4 MG PO TBDP
4.0000 mg | ORAL_TABLET | Freq: Three times a day (TID) | ORAL | 0 refills | Status: AC | PRN
  Filled 2024-01-27: qty 20, 7d supply, fill #0

## 2024-01-27 MED ORDER — DICYCLOMINE HCL 20 MG PO TABS
20.0000 mg | ORAL_TABLET | Freq: Two times a day (BID) | ORAL | 0 refills | Status: DC
  Filled 2024-01-27: qty 20, 10d supply, fill #0

## 2024-01-27 MED ORDER — FENTANYL CITRATE PF 50 MCG/ML IJ SOSY
50.0000 ug | PREFILLED_SYRINGE | Freq: Once | INTRAMUSCULAR | Status: AC
  Administered 2024-01-27: 50 ug via INTRAVENOUS
  Filled 2024-01-27: qty 1

## 2024-01-27 MED ORDER — ONDANSETRON HCL 4 MG/2ML IJ SOLN
4.0000 mg | Freq: Once | INTRAMUSCULAR | Status: AC
  Administered 2024-01-27: 4 mg via INTRAVENOUS
  Filled 2024-01-27: qty 2

## 2024-01-27 MED ORDER — IOHEXOL 350 MG/ML SOLN
75.0000 mL | Freq: Once | INTRAVENOUS | Status: AC | PRN
  Administered 2024-01-27: 75 mL via INTRAVENOUS

## 2024-01-27 MED ORDER — OXYCODONE-ACETAMINOPHEN 5-325 MG PO TABS
ORAL_TABLET | ORAL | Status: AC
  Filled 2024-01-27: qty 1

## 2024-01-27 NOTE — ED Provider Notes (Signed)
 [place] EMERGENCY DEPARTMENT AT [facility] Provider Note   CSN: [id number] Arrival date & time: 01/27/24  1320     Patient presents with: Hip Pain   Sheri Mann is a 58 y.o. female.   58 year old female presents today for concern of left lower quadrant abdominal pain.  Ongoing for the past month.  Does endorse some dysuria but denies any hematuria, history of kidney stones, history of abdominal surgeries.  States that the medication which she received earlier in the emergency department which was a narcotic medicine did help her symptoms.  No other nausea or vomiting.  The history is provided by the patient. No language interpreter was used.       Prior to Admission medications   Medication Sig Start Date End Date Taking? Authorizing Provider  amLODipine  (NORVASC ) 5 MG tablet Take 1 tablet (5 mg total) by mouth daily. 12/31/23   Fleming, Zelda W, NP  diclofenac  (VOLTAREN ) 75 MG EC tablet Take 1 tablet (75 mg total) by mouth 2 (two) times daily. For back pain 01/09/24   Theotis Haze ORN, NP  gabapentin  (NEURONTIN ) 300 MG capsule Take 1 capsule (300 mg total) by mouth at bedtime. 01/01/24   Lorren Greig PARAS, NP  methocarbamol  (ROBAXIN ) 500 MG tablet Take 1 tablet (500 mg total) by mouth at bedtime as needed for muscle spasms. For back pain 01/09/24   Theotis Haze ORN, NP    Allergies: Lisinopril     Review of Systems  Constitutional:  Negative for fever.  Respiratory:  Negative for shortness of breath.   Gastrointestinal:  Positive for abdominal pain. Negative for nausea and vomiting.  Genitourinary:  Positive for dysuria. Negative for difficulty urinating and flank pain.  All other systems reviewed and are negative.   Updated Vital Signs BP 120/85   Pulse 87   Temp 98.8 F (37.1 C)   Resp 18   LMP 11/11/2016 (Exact Date)   SpO2 98%   Physical Exam Vitals and nursing note reviewed.  Constitutional:      General: She is not in acute distress.     Appearance: Normal appearance. She is not ill-appearing.  HENT:     Head: Normocephalic and atraumatic.     Nose: Nose normal.  Eyes:     Conjunctiva/sclera: Conjunctivae normal.  Cardiovascular:     Rate and Rhythm: Normal rate and regular rhythm.  Pulmonary:     Effort: Pulmonary effort is normal. No respiratory distress.  Abdominal:     General: There is no distension.     Palpations: Abdomen is soft.     Tenderness: There is abdominal tenderness. There is left CVA tenderness. There is no guarding.  Musculoskeletal:        General: No deformity. Normal range of motion.     Cervical back: Normal range of motion.     Right lower leg: No edema.  Skin:    Findings: No rash.  Neurological:     Mental Status: She is alert.     (all labs ordered are listed, but only abnormal results are displayed) Labs Reviewed  COMPREHENSIVE METABOLIC PANEL WITH GFR - Abnormal; Notable for the following components:      Result Value   Glucose, Bld 128 (*)    Albumin 3.3 (*)    ALT 47 (*)    Alkaline Phosphatase 136 (*)    All other components within normal limits  URINALYSIS, ROUTINE W REFLEX MICROSCOPIC - Abnormal; Notable for the following components:  Hgb urine dipstick SMALL (*)    All other components within normal limits  LIPASE, BLOOD  CBC    EKG: None  Radiology: DG Hip Unilat With Pelvis 2-3 Views Left Result Date: 01/27/2024 CLINICAL DATA:  Left hip pain radiating down leg and around lower back. EXAM: DG HIP (WITH OR WITHOUT PELVIS) 2-3V LEFT COMPARISON:  08/22/2022. FINDINGS: There is no evidence of acute hip fracture or dislocation. Mild degenerative changes are present in the hips bilaterally. IMPRESSION: No acute fracture or dislocation. Electronically Signed   By: Leita Birmingham M.D.   On: 01/27/2024 14:37     Procedures   Medications Ordered in the ED  oxyCODONE -acetaminophen  (PERCOCET/ROXICET) 5-325 MG per tablet 1 tablet (1 tablet Oral Given 01/27/24 1355)  sodium  chloride 0.9 % bolus 1,000 mL (has no administration in time range)  fentaNYL  (SUBLIMAZE ) injection 50 mcg (has no administration in time range)  ondansetron  (ZOFRAN ) injection 4 mg (has no administration in time range)                                    Medical Decision Making Amount and/or Complexity of Data Reviewed Labs: ordered. Radiology: ordered.  Risk Prescription drug management.   Medical Decision Making / ED Course   This patient presents to the ED for concern of left-sided abdominal pain, this involves an extensive number of treatment options, and is a complaint that carries with it a high risk of complications and morbidity.  The differential diagnosis includes pyelonephritis, UTI, nephrolithiasis, diverticulitis, colitis  MDM: 58 year old female presents today for concern of left lower quadrant abdominal pain.  No associated nausea or vomiting.  No changes in stool habits.  No blood in stool.  Hemodynamically stable.  Overall well-appearing.  On exam there is tenderness to palpation of the left lower abdomen.  CBC is unremarkable, CMP without acute concern.  UA with small hemoglobin otherwise unremarkable.  Hip x-ray showed no concerning findings.  This was obtained through triage.  CT abdomen pelvis did not show any concerning findings.  Particularly no concerning findings to explain patient's symptoms.  Supportive care discussed. Schedule close follow-up with PCP. Bentyl  and Zofran  prescribed.  Charged in stable condition.  Return precaution discussed.  Patient voices understanding and is in agreement with plan.   Lab Tests: -I ordered, reviewed, and interpreted labs.   The pertinent results include:   Labs Reviewed  COMPREHENSIVE METABOLIC PANEL WITH GFR - Abnormal; Notable for the following components:      Result Value   Glucose, Bld 128 (*)    Albumin 3.3 (*)    ALT 47 (*)    Alkaline Phosphatase 136 (*)    All other components within normal limits   URINALYSIS, ROUTINE W REFLEX MICROSCOPIC - Abnormal; Notable for the following components:   Hgb urine dipstick SMALL (*)    All other components within normal limits  LIPASE, BLOOD  CBC      EKG  EKG Interpretation Date/Time:    Ventricular Rate:    PR Interval:    QRS Duration:    QT Interval:    QTC Calculation:   R Axis:      Text Interpretation:           Imaging Studies ordered: I ordered imaging studies including left hip x ray, ct abdomen pelvis w contrast I independently visualized and interpreted imaging. I agree with the radiologist interpretation  Medicines ordered and prescription drug management: Meds ordered this encounter  Medications   oxyCODONE -acetaminophen  (PERCOCET/ROXICET) 5-325 MG per tablet 1 tablet    Refill:  0   sodium chloride  0.9 % bolus 1,000 mL   fentaNYL  (SUBLIMAZE ) injection 50 mcg   ondansetron  (ZOFRAN ) injection 4 mg   iohexol  (OMNIPAQUE ) 350 MG/ML injection 75 mL   dicyclomine  (BENTYL ) 20 MG tablet    Sig: Take 1 tablet (20 mg total) by mouth 2 (two) times daily.    Dispense:  20 tablet    Refill:  0    Supervising Provider:   CLEOTILDE, BRIAN [[id number]]   ondansetron  (ZOFRAN -ODT) 4 MG disintegrating tablet    Sig: Take 1 tablet (4 mg total) by mouth every 8 (eight) hours as needed.    Dispense:  20 tablet    Refill:  0    Supervising Provider:   CLEOTILDE ROGUE [[id number]]    -I have reviewed the patients home medicines and have made adjustments as needed  Reevaluation: After the interventions noted above, I reevaluated the patient and found that they have :improved  Co morbidities that complicate the patient evaluation  Past Medical History:  Diagnosis Date   High cholesterol 2013    Hypertension    Obesity (BMI 30-39.9)    Urinary incontinence, mixed       Dispostion: Discharged in stable condition.  Discussed PCP follow-up.  Final diagnoses:  Left lower quadrant abdominal pain    ED Discharge Orders           Ordered    dicyclomine  (BENTYL ) 20 MG tablet  2 times daily        01/27/24 1756    ondansetron  (ZOFRAN -ODT) 4 MG disintegrating tablet  Every 8 hours PRN        01/27/24 1756               Hildegard Loge, PA-C 01/27/24 1801    Charlyn Sora, MD 01/27/24 1807

## 2024-01-27 NOTE — ED Triage Notes (Signed)
 Spanish interpreter used for triage:  Pt c.o left hip that radiates down to her leg and around to her lower back x 1 month.  States she was seen at another hospital and given a rx but it did not help with the pain. Reports fever this past week. Denies any redness or swelling but does c.o pain when she urinates.

## 2024-01-27 NOTE — Discharge Instructions (Signed)
 Please schedule a follow-up appointment with your primary care doctor.  No concerning findings on the blood work, urine test, or a CT scan.  X-ray did not show any significant bony abnormality other than degenerative changes which can occur over time.  I have sent Bentyl  and Zofran  into the pharmacy for you.  Return for any emergent symptoms.

## 2024-01-28 ENCOUNTER — Other Ambulatory Visit: Payer: Self-pay

## 2024-04-14 ENCOUNTER — Ambulatory Visit: Payer: Self-pay | Attending: Nurse Practitioner | Admitting: Nurse Practitioner

## 2024-04-14 ENCOUNTER — Other Ambulatory Visit (HOSPITAL_COMMUNITY)
Admission: RE | Admit: 2024-04-14 | Discharge: 2024-04-14 | Disposition: A | Discharge status: Home / Self Care | Payer: Self-pay | Source: Ambulatory Visit | Attending: Nurse Practitioner | Admitting: Nurse Practitioner

## 2024-04-14 ENCOUNTER — Encounter: Payer: Self-pay | Admitting: Nurse Practitioner

## 2024-04-14 ENCOUNTER — Other Ambulatory Visit: Payer: Self-pay

## 2024-04-14 VITALS — BP 111/73 | HR 76 | Resp 18 | Ht 59.0 in | Wt 172.2 lb

## 2024-04-14 DIAGNOSIS — I10 Essential (primary) hypertension: Secondary | ICD-10-CM

## 2024-04-14 DIAGNOSIS — Z124 Encounter for screening for malignant neoplasm of cervix: Secondary | ICD-10-CM

## 2024-04-14 DIAGNOSIS — Z1211 Encounter for screening for malignant neoplasm of colon: Secondary | ICD-10-CM

## 2024-04-14 DIAGNOSIS — Z23 Encounter for immunization: Secondary | ICD-10-CM

## 2024-04-14 MED ORDER — AMLODIPINE BESYLATE 5 MG PO TABS
5.0000 mg | ORAL_TABLET | Freq: Every day | ORAL | 1 refills | Status: AC
  Filled 2024-04-14: qty 90, 90d supply, fill #0

## 2024-04-14 NOTE — Progress Notes (Signed)
 Assessment & Plan:  Sheri Mann was seen today for medical management of chronic issues.  Diagnoses and all orders for this visit:  Encounter for Papanicolaou smear of cervix -     Cervicovaginal ancillary only -     Cytology - PAP  Primary hypertension -     CMP14+EGFR -     amLODipine  (NORVASC ) 5 MG tablet; Take 1 tablet (5 mg total) by mouth daily.  Colon cancer screening -     Fecal occult blood, imunochemical  Need for influenza vaccination -     Flu vaccine trivalent PF, 6mos and older(Flulaval,Afluria,Fluarix,Fluzone)    Patient has been counseled on age-appropriate routine health concerns for screening and prevention. These are reviewed and up-to-date. Referrals have been placed accordingly. Immunizations are up-to-date or declined.    Subjective:   Chief Complaint  Patient presents with   Medical Management of Chronic Issues    Sheri Mann 58 y.o. female presents to office today for well woman exam.   Mammogram UTD  Blood pressure is well controlled.   Review of Systems  Constitutional:  Negative for fever, malaise/fatigue and weight loss.  HENT: Negative.  Negative for nosebleeds.   Eyes: Negative.  Negative for blurred vision, double vision and photophobia.  Respiratory: Negative.  Negative for cough and shortness of breath.   Cardiovascular: Negative.  Negative for chest pain, palpitations and leg swelling.  Gastrointestinal: Negative.  Negative for heartburn, nausea and vomiting.  Musculoskeletal: Negative.  Negative for myalgias.  Neurological: Negative.  Negative for dizziness, focal weakness, seizures and headaches.  Psychiatric/Behavioral: Negative.  Negative for suicidal ideas.     Past Medical History:  Diagnosis Date   High cholesterol 2013    Hypertension    Obesity (BMI 30-39.9)    Urinary incontinence, mixed     Past Surgical History:  Procedure Laterality Date   BREAST EXCISIONAL BIOPSY Left    BREAST LUMPECTOMY     EYE  SURGERY  2012   TUBAL LIGATION  2000    Family History  Problem Relation Age of Onset   Cancer Neg Hx    Heart disease Neg Hx    Diabetes Neg Hx    Hypertension Neg Hx    Breast cancer Neg Hx     Social History Reviewed with no changes to be made today.   Outpatient Medications Prior to Visit  Medication Sig Dispense Refill   amLODipine  (NORVASC ) 5 MG tablet Take 1 tablet (5 mg total) by mouth daily. 90 tablet 1   gabapentin  (NEURONTIN ) 300 MG capsule Take 1 capsule (300 mg total) by mouth at bedtime. (Patient not taking: Reported on 04/14/2024) 90 capsule 0   methocarbamol  (ROBAXIN ) 500 MG tablet Take 1 tablet (500 mg total) by mouth at bedtime as needed for muscle spasms. For back pain (Patient not taking: Reported on 04/14/2024) 30 tablet 3   ondansetron  (ZOFRAN -ODT) 4 MG disintegrating tablet Take 1 tablet (4 mg total) by mouth every 8 (eight) hours as needed. (Patient not taking: Reported on 04/14/2024) 20 tablet 0   diclofenac  (VOLTAREN ) 75 MG EC tablet Take 1 tablet (75 mg total) by mouth 2 (two) times daily. For back pain (Patient not taking: Reported on 04/14/2024) 60 tablet 2   dicyclomine  (BENTYL ) 20 MG tablet Take 1 tablet (20 mg total) by mouth 2 (two) times daily. (Patient not taking: Reported on 04/14/2024) 20 tablet 0   No facility-administered medications prior to visit.    Allergies  Allergen Reactions  Lisinopril  Cough       Objective:    BP 111/73 (BP Location: Left Arm, Patient Position: Sitting, Cuff Size: Normal)   Pulse 76   Resp 18   Ht 4' 11 (1.499 m)   Wt 172 lb 3.2 oz (78.1 kg)   LMP 11/11/2016 (Exact Date)   SpO2 98%   BMI 34.78 kg/m  Wt Readings from Last 3 Encounters:  04/14/24 172 lb 3.2 oz (78.1 kg)  01/09/24 174 lb 6.4 oz (79.1 kg)  01/01/24 169 lb 9.6 oz (76.9 kg)    Physical Exam Vitals and nursing note reviewed. Exam conducted with a chaperone present.  Constitutional:      Appearance: She is well-developed.  HENT:      Head: Normocephalic and atraumatic.  Cardiovascular:     Rate and Rhythm: Normal rate and regular rhythm.     Heart sounds: Normal heart sounds. No murmur heard.    No friction rub. No gallop.  Pulmonary:     Effort: Pulmonary effort is normal. No tachypnea or respiratory distress.     Breath sounds: Normal breath sounds. No decreased breath sounds, wheezing, rhonchi or rales.  Chest:     Chest wall: No tenderness.  Abdominal:     General: Bowel sounds are normal.     Palpations: Abdomen is soft.     Hernia: There is no hernia in the left inguinal area.  Genitourinary:    Exam position: Lithotomy position.     Labia:        Right: No rash, tenderness, lesion or injury.        Left: No rash, tenderness, lesion or injury.      Vagina: Normal. No signs of injury and foreign body. No vaginal discharge, erythema, tenderness or bleeding.     Cervix: Normal.     Uterus: Not deviated and not enlarged.      Adnexa:        Right: No mass, tenderness or fullness.         Left: No mass, tenderness or fullness.       Rectum: Normal. No external hemorrhoid.  Musculoskeletal:        General: Normal range of motion.     Cervical back: Normal range of motion.  Lymphadenopathy:     Lower Body: No right inguinal adenopathy. No left inguinal adenopathy.  Skin:    General: Skin is warm and dry.  Neurological:     Mental Status: She is alert and oriented to person, place, and time.     Coordination: Coordination normal.  Psychiatric:        Behavior: Behavior normal. Behavior is cooperative.        Thought Content: Thought content normal.        Judgment: Judgment normal.          Patient has been counseled extensively about nutrition and exercise as well as the importance of adherence with medications and regular follow-up. The patient was given clear instructions to go to ER or return to medical center if symptoms don't improve, worsen or new problems develop. The patient verbalized  understanding.   Follow-up: Return in about 6 months (around 10/13/2024) for HTN.   Sheri LELON Servant, FNP-BC [facility] [place], [place] [phone]   04/14/2024, 2:29 PM

## 2024-04-15 ENCOUNTER — Ambulatory Visit: Payer: Self-pay | Admitting: Nurse Practitioner

## 2024-04-15 LAB — CMP14+EGFR
ALT: 13 IU/L (ref 0–32)
AST: 17 IU/L (ref 0–40)
Albumin: 4.2 g/dL (ref 3.8–4.9)
Alkaline Phosphatase: 154 IU/L — ABNORMAL HIGH (ref 49–135)
BUN/Creatinine Ratio: 17 (ref 9–23)
BUN: 12 mg/dL (ref 6–24)
Bilirubin Total: 0.3 mg/dL (ref 0.0–1.2)
CO2: 23 mmol/L (ref 20–29)
Calcium: 9.4 mg/dL (ref 8.7–10.2)
Chloride: 105 mmol/L (ref 96–106)
Creatinine, Ser: 0.71 mg/dL (ref 0.57–1.00)
Globulin, Total: 2.7 g/dL (ref 1.5–4.5)
Glucose: 90 mg/dL (ref 70–99)
Potassium: 3.8 mmol/L (ref 3.5–5.2)
Sodium: 142 mmol/L (ref 134–144)
Total Protein: 6.9 g/dL (ref 6.0–8.5)
eGFR: 100 mL/min/1.73 (ref >59)

## 2024-04-15 LAB — CYTOLOGY - PAP
Chlamydia: NEGATIVE
Comment: NEGATIVE
Comment: NEGATIVE
Comment: NORMAL
Diagnosis: NEGATIVE
High risk HPV: NEGATIVE
Neisseria Gonorrhea: NEGATIVE

## 2024-04-15 LAB — CERVICOVAGINAL ANCILLARY ONLY
Bacterial Vaginitis (gardnerella): NEGATIVE
Candida Glabrata: NEGATIVE
Candida Vaginitis: NEGATIVE
Chlamydia: NEGATIVE
Comment: NEGATIVE
Comment: NEGATIVE
Comment: NEGATIVE
Comment: NEGATIVE
Comment: NEGATIVE
Comment: NORMAL
Neisseria Gonorrhea: NEGATIVE
Trichomonas: NEGATIVE

## 2024-04-19 LAB — FECAL OCCULT BLOOD, IMMUNOCHEMICAL: Fecal Occult Bld: NEGATIVE

## 2024-04-23 ENCOUNTER — Other Ambulatory Visit: Payer: Self-pay

## 2024-06-06 ENCOUNTER — Other Ambulatory Visit: Payer: Self-pay | Admitting: Family

## 2024-06-06 ENCOUNTER — Other Ambulatory Visit: Payer: Self-pay | Admitting: Nurse Practitioner

## 2024-06-06 ENCOUNTER — Other Ambulatory Visit: Payer: Self-pay

## 2024-06-06 DIAGNOSIS — G629 Polyneuropathy, unspecified: Secondary | ICD-10-CM

## 2024-06-06 MED ORDER — GABAPENTIN 300 MG PO CAPS
300.0000 mg | ORAL_CAPSULE | Freq: Every day | ORAL | 0 refills | Status: AC
  Filled 2024-06-06: qty 90, 90d supply, fill #0

## 2024-06-06 NOTE — Telephone Encounter (Signed)
 Complete. Please send future refills to patient's primary provider Haze Servant, NP.

## 2024-06-13 ENCOUNTER — Other Ambulatory Visit: Payer: Self-pay

## 2024-10-13 ENCOUNTER — Ambulatory Visit: Payer: Self-pay | Admitting: Nurse Practitioner
